# Patient Record
Sex: Male | Born: 1945 | Race: White | Hispanic: No | Marital: Married | Smoking: Former smoker
Health system: Southern US, Community
[De-identification: ages and names within clinical notes are randomized; demographics above are authoritative.]

## PROBLEM LIST (undated history)

## (undated) DIAGNOSIS — I442 Atrioventricular block, complete: Secondary | ICD-10-CM

## (undated) DIAGNOSIS — I5032 Chronic diastolic (congestive) heart failure: Secondary | ICD-10-CM

## (undated) DIAGNOSIS — I10 Essential (primary) hypertension: Secondary | ICD-10-CM

## (undated) DIAGNOSIS — E785 Hyperlipidemia, unspecified: Secondary | ICD-10-CM

## (undated) DIAGNOSIS — E039 Hypothyroidism, unspecified: Secondary | ICD-10-CM

## (undated) DIAGNOSIS — I447 Left bundle-branch block, unspecified: Secondary | ICD-10-CM

## (undated) DIAGNOSIS — N183 Chronic kidney disease, stage 3 (moderate): Secondary | ICD-10-CM

## (undated) DIAGNOSIS — E669 Obesity, unspecified: Secondary | ICD-10-CM

## (undated) DIAGNOSIS — I119 Hypertensive heart disease without heart failure: Secondary | ICD-10-CM

## (undated) DIAGNOSIS — I509 Heart failure, unspecified: Secondary | ICD-10-CM

## (undated) DIAGNOSIS — J449 Chronic obstructive pulmonary disease, unspecified: Secondary | ICD-10-CM

## (undated) DIAGNOSIS — E118 Type 2 diabetes mellitus with unspecified complications: Secondary | ICD-10-CM

## (undated) DIAGNOSIS — Z950 Presence of cardiac pacemaker: Secondary | ICD-10-CM

## (undated) HISTORY — PX: INSERT / REPLACE / REMOVE PACEMAKER: SUR710

## (undated) HISTORY — DX: Atrioventricular block, complete: I44.2

---

## 2010-01-11 ENCOUNTER — Ambulatory Visit: Payer: Self-pay | Admitting: Cardiology

## 2011-05-31 ENCOUNTER — Emergency Department (HOSPITAL_COMMUNITY): Payer: Medicare Other

## 2011-05-31 ENCOUNTER — Emergency Department (HOSPITAL_COMMUNITY)
Admission: EM | Admit: 2011-05-31 | Discharge: 2011-05-31 | Disposition: A | Discharge status: Home / Self Care | Payer: Medicare Other | Attending: Emergency Medicine | Admitting: Emergency Medicine

## 2011-05-31 ENCOUNTER — Encounter (HOSPITAL_COMMUNITY): Payer: Self-pay | Admitting: *Deleted

## 2011-05-31 DIAGNOSIS — R112 Nausea with vomiting, unspecified: Secondary | ICD-10-CM | POA: Insufficient documentation

## 2011-05-31 DIAGNOSIS — E119 Type 2 diabetes mellitus without complications: Secondary | ICD-10-CM | POA: Insufficient documentation

## 2011-05-31 DIAGNOSIS — I10 Essential (primary) hypertension: Secondary | ICD-10-CM | POA: Insufficient documentation

## 2011-05-31 DIAGNOSIS — M7989 Other specified soft tissue disorders: Secondary | ICD-10-CM | POA: Insufficient documentation

## 2011-05-31 DIAGNOSIS — E789 Disorder of lipoprotein metabolism, unspecified: Secondary | ICD-10-CM | POA: Insufficient documentation

## 2011-05-31 DIAGNOSIS — R42 Dizziness and giddiness: Secondary | ICD-10-CM | POA: Insufficient documentation

## 2011-05-31 DIAGNOSIS — I509 Heart failure, unspecified: Secondary | ICD-10-CM | POA: Insufficient documentation

## 2011-05-31 DIAGNOSIS — E079 Disorder of thyroid, unspecified: Secondary | ICD-10-CM | POA: Insufficient documentation

## 2011-05-31 DIAGNOSIS — Z79899 Other long term (current) drug therapy: Secondary | ICD-10-CM | POA: Insufficient documentation

## 2011-05-31 DIAGNOSIS — R0602 Shortness of breath: Secondary | ICD-10-CM | POA: Insufficient documentation

## 2011-05-31 DIAGNOSIS — R29810 Facial weakness: Secondary | ICD-10-CM | POA: Insufficient documentation

## 2011-05-31 DIAGNOSIS — R630 Anorexia: Secondary | ICD-10-CM | POA: Insufficient documentation

## 2011-05-31 HISTORY — DX: Heart failure, unspecified: I50.9

## 2011-05-31 HISTORY — DX: Essential (primary) hypertension: I10

## 2011-05-31 LAB — COMPREHENSIVE METABOLIC PANEL
AST: 14 U/L (ref 0–37)
Albumin: 3.5 g/dL (ref 3.5–5.2)
Chloride: 104 mEq/L (ref 96–112)
Creatinine, Ser: 1.59 mg/dL — ABNORMAL HIGH (ref 0.50–1.35)
Total Bilirubin: 0.2 mg/dL — ABNORMAL LOW (ref 0.3–1.2)

## 2011-05-31 LAB — GLUCOSE, CAPILLARY: Glucose-Capillary: 111 mg/dL — ABNORMAL HIGH (ref 70–99)

## 2011-05-31 LAB — CBC
MCV: 87.8 fL (ref 78.0–100.0)
Platelets: 212 10*3/uL (ref 150–400)
RDW: 14.2 % (ref 11.5–15.5)
WBC: 9 10*3/uL (ref 4.0–10.5)

## 2011-05-31 MED ORDER — MECLIZINE HCL 12.5 MG PO TABS
25.0000 mg | ORAL_TABLET | Freq: Once | ORAL | Status: AC
  Administered 2011-05-31: 25 mg via ORAL
  Filled 2011-05-31: qty 2

## 2011-05-31 MED ORDER — SODIUM CHLORIDE 0.9 % IV SOLN: INTRAVENOUS | Status: DC

## 2011-05-31 MED ORDER — ONDANSETRON HCL 4 MG/2ML IJ SOLN
4.0000 mg | Freq: Once | INTRAMUSCULAR | Status: AC
  Administered 2011-05-31: 4 mg via INTRAVENOUS
  Filled 2011-05-31: qty 2

## 2011-05-31 MED ORDER — MECLIZINE HCL 25 MG PO TABS: 25.0000 mg | ORAL_TABLET | Freq: Three times a day (TID) | ORAL | Status: AC | PRN

## 2011-05-31 MED ORDER — SODIUM CHLORIDE 0.9 % IV BOLUS (SEPSIS)
250.0000 mL | Freq: Once | INTRAVENOUS | Status: AC
  Administered 2011-05-31: 250 mL via INTRAVENOUS

## 2011-05-31 NOTE — ED Notes (Signed)
Pt c/o dizziness, nausea and vomiting since Tuesday. Pt has had right sided facial drooping since yesterday. Pt able to raise arms over his head and raise legs off floor. Pt states that the dizziness and balance have gotten worse since Tuesday.

## 2011-05-31 NOTE — ED Provider Notes (Signed)
History   This chart was scribed for Shelda Jakes, MD by Sofie Rower. The patient was seen in room APAH2/APAH2 and the patient's care was started at 3:14 PM     CSN: [id number]  Arrival date & time 05/31/11  1408   First MD Initiated Contact with Patient 05/31/11 1509      Chief Complaint  Patient presents with  . Dizziness    (Consider location/radiation/quality/duration/timing/severity/associated sxs/prior treatment) HPI  [name] is a 66 y.o. male who presents to the Emergency Department complaining of moderate, episodic dizziness onset five days ago with associated symptoms of nausea, vomiting (tuesday, 05/27/11 at 2:00PM), right sided facial drooping (onset Tuesday, 05/27/11 at 2:00PM), shortness of breath, swelling in both feet (onset Tuesday 05/27/11). The pt states "the room spins with the dizziness, it feels like he is going around in a circle." Pt relative informs EDP that facial droop started on Monday. Pt states he "is closing his right eye so he can balance." Pt wife states, "there has been no vomiting since Wednesday, pt fell in the bathroom yesterday." Pt wife informs EDP "the pt has had a decreased appetite for the past two days, and she was worried about possible vertigo within the pt." Modifying factors include shutting the right eye with moderate relief, standing up which intensifies the dizziness. Pt has a hx of diabetes, neuropathy, daughter with a hx of nora virus.  Pt denies diarrhea, vomiting yesterday, inability to move feet, similar symptoms in the past, headache, fever, chills, nasal congestion, sore throat, chest pain, cough, belly pain, dysuria, neck pain, back pain, hematuria, rash. Pt wife denies pt hx of vertigo.  Pt is married.   PCP is [place].     Past Medical History  Diagnosis Date  . Diabetes mellitus   . Neuropathy   . CHF (congestive heart failure)   . High cholesterol   . Hypertension   . Renal disorder   . Thyroid disease        History  Substance Use Topics  . Smoking status: Former Games developer  . Smokeless tobacco: Not on file  . Alcohol Use: No      Review of Systems  All other systems reviewed and are negative.    10 Systems reviewed and all are negative for acute change except as noted in the HPI.    Allergies  Lasix  Home Medications   Current Outpatient Rx  Name Route Sig Dispense Refill  . MECLIZINE HCL 25 MG PO TABS Oral Take 1 tablet (25 mg total) by mouth 3 (three) times daily as needed. 30 tablet 0    BP 136/78  Pulse 66  Temp(Src) 97.5 F (36.4 C) (Oral)  Resp 24  Ht 5\' 11"  (1.803 m)  Wt 250 lb (113.399 kg)  BMI 34.87 kg/m2  SpO2 97%  Physical Exam  Nursing note and vitals reviewed. Constitutional: He is oriented to person, place, and time. He appears well-developed and well-nourished.  HENT:  Nose: Nose normal.  Mouth/Throat: Oropharynx is clear and moist.  Eyes: Conjunctivae are normal.  Neck: Normal range of motion. Neck supple.  Cardiovascular: Normal rate, regular rhythm and normal heart sounds.  Exam reveals no gallop and no friction rub.   No murmur heard. Pulmonary/Chest: Effort normal and breath sounds normal. He has no wheezes. He has no rales.  Abdominal: Soft. Bowel sounds are normal. There is no tenderness. There is no rebound.  Musculoskeletal: Normal range of motion.  Neurological: He is alert  and oriented to person, place, and time. Coordination normal.  Skin: Skin is warm and dry.  Psychiatric: He has a normal mood and affect. His behavior is normal.    ED Course  Procedures (including critical care time)  DIAGNOSTIC STUDIES: Oxygen Saturation is 97% on room air, adequate by my interpretation.    COORDINATION OF CARE:  Results for orders placed during the hospital encounter of 05/31/11  GLUCOSE, CAPILLARY      Component Value Range   Glucose-Capillary 111 (*) 70 - 99 (mg/dL)  CBC      Component Value Range   WBC 9.0  4.0 - 10.5 (K/uL)    RBC 3.93 (*) 4.22 - 5.81 (MIL/uL)   Hemoglobin 11.8 (*) 13.0 - 17.0 (g/dL)   HCT 16.1 (*) 09.6 - 52.0 (%)   MCV 87.8  78.0 - 100.0 (fL)   MCH 30.0  26.0 - 34.0 (pg)   MCHC 34.2  30.0 - 36.0 (g/dL)   RDW 04.5  40.9 - 81.1 (%)   Platelets 212  150 - 400 (K/uL)  COMPREHENSIVE METABOLIC PANEL      Component Value Range   Sodium 139  135 - 145 (mEq/L)   Potassium 3.7  3.5 - 5.1 (mEq/L)   Chloride 104  96 - 112 (mEq/L)   CO2 26  19 - 32 (mEq/L)   Glucose, Bld 103 (*) 70 - 99 (mg/dL)   BUN 33 (*) 6 - 23 (mg/dL)   Creatinine, Ser 9.14 (*) 0.50 - 1.35 (mg/dL)   Calcium 78.2  8.4 - 10.5 (mg/dL)   Total Protein 7.3  6.0 - 8.3 (g/dL)   Albumin 3.5  3.5 - 5.2 (g/dL)   AST 14  0 - 37 (U/L)   ALT 13  0 - 53 (U/L)   Alkaline Phosphatase 100  39 - 117 (U/L)   Total Bilirubin 0.2 (*) 0.3 - 1.2 (mg/dL)   GFR calc non Af Amer 44 (*) >90 (mL/min)   GFR calc Af Amer 51 (*) >90 (mL/min)   Dg Chest 2 View  05/31/2011  *RADIOLOGY REPORT*  Clinical Data: Dizziness and shortness of breath.  CHEST - 2 VIEW  Comparison: None.  Findings: Cardiac enlargement is noted.  The lung volumes are low. No focal airspace disease is evident.  The visualized soft tissues and bony thorax are unremarkable.  IMPRESSION:  1.  Cardiomegaly without failure. 2.  Low lung volumes.  Original Report Authenticated By: Jamesetta Orleans. MATTERN, M.D.   Ct Head Wo Contrast  05/31/2011  *RADIOLOGY REPORT*  Clinical Data: Dizziness.  Hypertension.  CT HEAD WITHOUT CONTRAST  Technique:  Contiguous axial images were obtained from the base of the skull through the vertex without contrast.  Comparison: None.  Findings: Moderate generalized atrophy is advanced for age. 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  Ventricular enlargement is proportionate to the degree of atrophy.  Mild periventricular white matter hypoattenuation is present bilaterally.  No acute cortical infarct, hemorrhage, or mass lesion is present.  The paranasal sinuses  and mastoid air cells are clear.  The osseous skull is intact. Atherosclerotic calcifications are present within the cavernous carotid arteries bilaterally.  IMPRESSION:  1.  Moderate atrophy and white matter disease. 2.  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 3.  No acute intracranial abnormality. 4.  Atherosclerosis.  Original Report Authenticated By: Jamesetta Orleans. MATTERN, M.D.     Date: 05/31/2011  Rate: 79  Rhythm: normal sinus rhythm  QRS Axis: normal  Intervals: normal  ST/T Wave abnormalities: nonspecific ST/T changes  Conduction Disutrbances:left bundle branch block  Narrative Interpretation:   Old EKG Reviewed: none available        1. Vertigo     3:25PM- EDP at bedside discusses treatment plan.   3:25PM- Pt relative informs EDP that she would like EDP to inquire with pt's wife about symptomatic timeline.   3:56PM-Recheck. EDP inquires additional pt hx from pt wife.   MDM  Suspect symptoms are vertigo most likely etiology is viral do not think it's a central patient's head CT negative symptoms been ongoing now for 5 days. Started with nausea and vomiting that is since eased off another reason why it sounds like it's viral. Patient improved the emergency department with Antivert. Patient followed by [facility] Patient known to have renal insufficiency. EKG with left bundle branch block noted family states that his had abnormal EKGs in the past no R1 for comparison. No focal findings concerns initially by nursing about right-sided facial droop as per family is actually baseline and there is no change in that at all. Family was mostly just concerned about the persistent dizziness which made it difficult for him to ambulate and they were worried he was going to be falling at home.      I personally performed the services described in this documentation, which was scribed in my presence. The recorded information has been reviewed and  considered.     Shelda Jakes, MD 05/31/11 [id number]

## 2011-05-31 NOTE — Discharge Instructions (Signed)
Benign Positional Vertigo Vertigo means you feel like you or your surroundings are moving when they are not. Benign positional vertigo is the most common form of vertigo. Benign means that the cause of your condition is not serious. Benign positional vertigo is more common in older adults. CAUSES  Benign positional vertigo is the result of an upset in the labyrinth system. This is an area in the middle ear that helps control your balance. This may be caused by a viral infection, head injury, or repetitive motion. However, often no specific cause is found. SYMPTOMS  Symptoms of benign positional vertigo occur when you move your head or eyes in different directions. Some of the symptoms may include:  Loss of balance and falls.   Vomiting.   Blurred vision.   Dizziness.   Nausea.   Involuntary eye movements (nystagmus).  DIAGNOSIS  Benign positional vertigo is usually diagnosed by physical exam. If the specific cause of your benign positional vertigo is unknown, your caregiver may perform imaging tests, such as magnetic resonance imaging (MRI) or computed tomography (CT). TREATMENT  Your caregiver may recommend movements or procedures to correct the benign positional vertigo. Medicines such as meclizine, benzodiazepines, and medicines for nausea may be used to treat your symptoms. In rare cases, if your symptoms are caused by certain conditions that affect the inner ear, you may need surgery. HOME CARE INSTRUCTIONS   Follow your caregiver's instructions.   Move slowly. Do not make sudden body or head movements.   Avoid driving.   Avoid operating heavy machinery.   Avoid performing any tasks that would be dangerous to you or others during a vertigo episode.   Drink enough fluids to keep your urine clear or pale yellow.  SEEK IMMEDIATE MEDICAL CARE IF:   You develop problems with walking, weakness, numbness, or using your arms, hands, or legs.   You have difficulty speaking.   You  develop severe headaches.   Your nausea or vomiting continues or gets worse.   You develop visual changes.   Your family or friends notice any behavioral changes.   Your condition gets worse.   You have a fever.   You develop a stiff neck or sensitivity to light.  MAKE SURE YOU:   Understand these instructions.   Will watch your condition.   Will get help right away if you are not doing well or get worse.  Document Released: 11/11/2005 Document Revised: 01/23/2011 Document Reviewed: 10/24/2010 [facility] Patient Information 2012 [place], [place].   Take [facility]vert as [facility]. Return for any new or worse symptoms.

## 2012-08-01 ENCOUNTER — Emergency Department (HOSPITAL_COMMUNITY): Payer: Medicare Other

## 2012-08-01 ENCOUNTER — Inpatient Hospital Stay (HOSPITAL_COMMUNITY)
Admission: AD | Admit: 2012-08-01 | Discharge: 2012-08-05 | DRG: 308 | Disposition: A | Discharge status: Home / Self Care | Payer: Medicare Other | Attending: Cardiology | Admitting: Cardiology

## 2012-08-01 ENCOUNTER — Encounter (HOSPITAL_COMMUNITY): Payer: Self-pay | Admitting: Emergency Medicine

## 2012-08-01 DIAGNOSIS — N179 Acute kidney failure, unspecified: Secondary | ICD-10-CM | POA: Diagnosis present

## 2012-08-01 DIAGNOSIS — I119 Hypertensive heart disease without heart failure: Secondary | ICD-10-CM | POA: Diagnosis present

## 2012-08-01 DIAGNOSIS — Z794 Long term (current) use of insulin: Secondary | ICD-10-CM

## 2012-08-01 DIAGNOSIS — I442 Atrioventricular block, complete: Principal | ICD-10-CM | POA: Diagnosis present

## 2012-08-01 DIAGNOSIS — R001 Bradycardia, unspecified: Secondary | ICD-10-CM | POA: Diagnosis present

## 2012-08-01 DIAGNOSIS — Z7982 Long term (current) use of aspirin: Secondary | ICD-10-CM

## 2012-08-01 DIAGNOSIS — I469 Cardiac arrest, cause unspecified: Secondary | ICD-10-CM | POA: Diagnosis present

## 2012-08-01 DIAGNOSIS — N184 Chronic kidney disease, stage 4 (severe): Secondary | ICD-10-CM | POA: Diagnosis present

## 2012-08-01 DIAGNOSIS — I130 Hypertensive heart and chronic kidney disease with heart failure and stage 1 through stage 4 chronic kidney disease, or unspecified chronic kidney disease: Secondary | ICD-10-CM | POA: Diagnosis present

## 2012-08-01 DIAGNOSIS — Z950 Presence of cardiac pacemaker: Secondary | ICD-10-CM | POA: Diagnosis not present

## 2012-08-01 DIAGNOSIS — N183 Chronic kidney disease, stage 3 unspecified: Secondary | ICD-10-CM | POA: Diagnosis present

## 2012-08-01 DIAGNOSIS — Z87891 Personal history of nicotine dependence: Secondary | ICD-10-CM

## 2012-08-01 DIAGNOSIS — I4891 Unspecified atrial fibrillation: Secondary | ICD-10-CM | POA: Diagnosis present

## 2012-08-01 DIAGNOSIS — IMO0001 Reserved for inherently not codable concepts without codable children: Secondary | ICD-10-CM | POA: Diagnosis present

## 2012-08-01 DIAGNOSIS — E1149 Type 2 diabetes mellitus with other diabetic neurological complication: Secondary | ICD-10-CM | POA: Diagnosis present

## 2012-08-01 DIAGNOSIS — I252 Old myocardial infarction: Secondary | ICD-10-CM

## 2012-08-01 DIAGNOSIS — E1142 Type 2 diabetes mellitus with diabetic polyneuropathy: Secondary | ICD-10-CM | POA: Diagnosis present

## 2012-08-01 DIAGNOSIS — IMO0002 Reserved for concepts with insufficient information to code with codable children: Secondary | ICD-10-CM

## 2012-08-01 DIAGNOSIS — I447 Left bundle-branch block, unspecified: Secondary | ICD-10-CM | POA: Diagnosis present

## 2012-08-01 DIAGNOSIS — E039 Hypothyroidism, unspecified: Secondary | ICD-10-CM | POA: Diagnosis present

## 2012-08-01 DIAGNOSIS — Z8249 Family history of ischemic heart disease and other diseases of the circulatory system: Secondary | ICD-10-CM

## 2012-08-01 DIAGNOSIS — I5033 Acute on chronic diastolic (congestive) heart failure: Secondary | ICD-10-CM | POA: Diagnosis present

## 2012-08-01 DIAGNOSIS — R7989 Other specified abnormal findings of blood chemistry: Secondary | ICD-10-CM | POA: Diagnosis present

## 2012-08-01 DIAGNOSIS — D72829 Elevated white blood cell count, unspecified: Secondary | ICD-10-CM | POA: Diagnosis present

## 2012-08-01 DIAGNOSIS — I509 Heart failure, unspecified: Secondary | ICD-10-CM | POA: Diagnosis present

## 2012-08-01 DIAGNOSIS — E7800 Pure hypercholesterolemia, unspecified: Secondary | ICD-10-CM | POA: Diagnosis present

## 2012-08-01 HISTORY — DX: Hyperlipidemia, unspecified: E78.5

## 2012-08-01 HISTORY — DX: Left bundle-branch block, unspecified: I44.7

## 2012-08-01 HISTORY — DX: Chronic diastolic (congestive) heart failure: I50.32

## 2012-08-01 HISTORY — DX: Type 2 diabetes mellitus with unspecified complications: E11.8

## 2012-08-01 HISTORY — DX: Obesity, unspecified: E66.9

## 2012-08-01 HISTORY — DX: Hypothyroidism, unspecified: E03.9

## 2012-08-01 HISTORY — DX: Chronic kidney disease, stage 3 (moderate): N18.3

## 2012-08-01 HISTORY — DX: Hypertensive heart disease without heart failure: I11.9

## 2012-08-01 HISTORY — DX: Presence of cardiac pacemaker: Z95.0

## 2012-08-01 LAB — COMPREHENSIVE METABOLIC PANEL
BUN: 38 mg/dL — ABNORMAL HIGH (ref 6–23)
CO2: 23 mEq/L (ref 19–32)
Calcium: 9.8 mg/dL (ref 8.4–10.5)
Creatinine, Ser: 2.03 mg/dL — ABNORMAL HIGH (ref 0.50–1.35)
GFR calc Af Amer: 38 mL/min — ABNORMAL LOW (ref >90)
GFR calc non Af Amer: 32 mL/min — ABNORMAL LOW (ref >90)
Glucose, Bld: 208 mg/dL — ABNORMAL HIGH (ref 70–99)

## 2012-08-01 LAB — BLOOD GAS, ARTERIAL
Bicarbonate: 20.7 mEq/L (ref 20.0–24.0)
Expiratory PAP: 4
Inspiratory PAP: 12
Patient temperature: 37
pH, Arterial: 7.284 — ABNORMAL LOW (ref 7.350–7.450)
pO2, Arterial: 287 mmHg — ABNORMAL HIGH (ref 80.0–100.0)

## 2012-08-01 LAB — CBC WITH DIFFERENTIAL/PLATELET
Eosinophils Relative: 4 % (ref 0–5)
HCT: 37.3 % — ABNORMAL LOW (ref 39.0–52.0)
Lymphocytes Relative: 39 % (ref 12–46)
Lymphs Abs: 5.4 10*3/uL — ABNORMAL HIGH (ref 0.7–4.0)
MCV: 89 fL (ref 78.0–100.0)
Monocytes Absolute: 0.7 10*3/uL (ref 0.1–1.0)
Monocytes Relative: 5 % (ref 3–12)
RBC: 4.19 MIL/uL — ABNORMAL LOW (ref 4.22–5.81)
RDW: 14.1 % (ref 11.5–15.5)
WBC: 13.9 10*3/uL — ABNORMAL HIGH (ref 4.0–10.5)

## 2012-08-01 LAB — TROPONIN I: Troponin I: <0.3 ng/mL (ref <0.30)

## 2012-08-01 LAB — PROTIME-INR: Prothrombin Time: 13.9 seconds (ref 11.6–15.2)

## 2012-08-01 MED ORDER — ONDANSETRON HCL 4 MG/2ML IJ SOLN
INTRAMUSCULAR | Status: AC
  Administered 2012-08-01: 4 mg
  Filled 2012-08-01: qty 2

## 2012-08-01 MED ORDER — FENTANYL CITRATE 0.05 MG/ML IJ SOLN
100.0000 ug | Freq: Once | INTRAMUSCULAR | Status: AC
  Administered 2012-08-01: 100 ug via INTRAVENOUS
  Filled 2012-08-01: qty 2

## 2012-08-01 MED ORDER — ATROPINE SULFATE 1 MG/ML IJ SOLN
0.5000 mg | Freq: Once | INTRAMUSCULAR | Status: AC
  Administered 2012-08-01: 0.5 mg via INTRAVENOUS

## 2012-08-01 MED ORDER — DOPAMINE-DEXTROSE 3.2-5 MG/ML-% IV SOLN
INTRAVENOUS | Status: AC
  Administered 2012-08-01: 10 ug/kg/min
  Filled 2012-08-01: qty 250

## 2012-08-01 NOTE — ED Provider Notes (Signed)
History     This chart was scribed for Hurman Horn, MD by Jiles Prows, ED Scribe. The patient was seen in room APA01/APA01 and the patient's care was started at 8:37 PM.  CSN: [id number]  Arrival date & time 08/01/12  2044   Chief Complaint  Patient presents with  . Respiratory Distress   The history is provided by the patient and medical records. No language interpreter was used.   HPI Comments: [name] is a 67 y.o. male with a h/o DM and CHF who presents to the Emergency Department BIB EMS on BiPAP complaining of sudden, severe SOB, light-headedness, sweats, and generalized weakness that began a couple of hours ago.  EMS reports he was profusely, cyanotic, agonal respirations, diaphoretic, awake and talking but no palpable pulses when they arrived.  EMS reports that 30 min prior to arriving, his wife reports a blood sugar level of 400.  By the time EMS arrived, they read his sugar level at 207 after she had given him Novalog.  Pt reports nausea and vomiting once upon arrival to ED and airway suctioned Pt with good gag reflex.  Pt denies numbness, chest pain, headache, fever, chills, diarrhea, cough, and any other pain.  Heart rate was 20 bpm upon arrival to the ED.  EMS reports they gave 2 doses of atropine via an IO line for bradycardia rate 20 PTA.  [facility]  Past Medical History  Diagnosis Date  . Diabetes mellitus   . Neuropathy   . CHF (congestive heart failure)   . High cholesterol   . Hypertension   . Renal disorder   . Thyroid disease     History reviewed. No pertinent past surgical history.  History reviewed. No pertinent family history.  History  Substance Use Topics  . Smoking status: Former Games developer  . Smokeless tobacco: Not on file  . Alcohol Use: No      Review of Systems 10 Systems reviewed and all are negative for acute change except as noted in the HPI.   Allergies  Lasix  Home Medications   No current  outpatient prescriptions on file.  BP 118/91  Pulse 20  Resp 29  Ht 5\' 10"  (1.778 m)  Wt 250 lb (113.399 kg)  BMI 35.87 kg/m2  SpO2 100%  Physical Exam  Nursing note and vitals reviewed. Constitutional: He is oriented to person, place, and time. He appears well-developed and well-nourished.  Awake, alert, nontoxic appearance.  HENT:  Head: Atraumatic.  Eyes: Right eye exhibits no discharge. Left eye exhibits no discharge.  Neck: Neck supple.  Cardiovascular:  No murmur heard. Heart rate irregular and bradycardic. No bp obtainable upon arrival.  Pulse is palpable carotid only.  Pulmonary/Chest: He is in respiratory distress. He exhibits no tenderness.  Bilateral crackles decreased breath sounds bilaterally.  Severe respiratory distress.  On BiPAP.  No pulse ox obtainable upon arrival.    Abdominal: Soft. There is no tenderness. There is no rebound.  Musculoskeletal: He exhibits no edema and no tenderness.  Baseline ROM, no obvious new focal weakness.  Neurological: He is alert and oriented to person, place, and time.  Mental status and motor strength appears baseline for patient and situation.  Moves all 4 extremities to command.  3/5 strength bilaterally.  Skin: No rash noted. There is pallor.  Psychiatric: He has a normal mood and affect.    ED Course  Procedures (including critical care time) ECG: Complete heart block,  a ventricular rhythm, ventricular rate 30, left axis deviation, right bundle branch block, compared with April 2013 left bundle branch block no longer present, complete heart block and bradycardia now present  DIAGNOSTIC STUDIES: Oxygen Saturation is 100% on BiPap, adequate by my interpretation with 100% O2 via Bipap.    COORDINATION OF CARE: 8:59 PM - Blood sugar 193. Pale, ashen, and cyanotic upon arrival with carotid pulse palpable only with no pulse ox or BP obtainable upon arrival, but still able to talk and follow simple commands despite severe distress  and presyncope.  Pt started on dopamine, transcutaneous pacemaker, repeat atropine with partial capture of pacer.  Radial pulse now present.  SBP>100.  Pulse ox 100%.  Patient / Family / Caregiver understand and agree with initial ED impression and plan with expectations set for ED visit.  10:32 PM - Pt is off of BiPap.  He is awake and alert.  Pulse oximetry in 90's.  D/w Cards at [facility] accepted for transfer.  Patient tolerated transcutaneous pacing well in the emergency department, transitioned from BiPAP to oxygen mask to nasal cannula oxygen maintaining pulse oximetry in the 90s the emergency department.  Pt feels improved after observation and/or treatment in ED.Patient / Family / Caregiver informed of clinical course, understand medical decision-making process, and agree with plan.  Just prior to transfer the patient was switched from ED to [facility] monitor so transcutaneous pacing stopped revealing patient converted to sinus rhythm with left bundle-branch block with first degree AV block normal rate.  Pacer pads in place and dopamine available to [facility] for prn use during transfer.  CRITICAL CARE Performed by: Hurman Horn Total critical care time: including initial stabilization for severe symptomatic bradycardia with complete heart block with atropine, dopamine, and transcutaneous pacing. Critical care time was exclusive of separately billable procedures and treating other patients. Critical care was necessary to treat or prevent imminent or life-threatening deterioration. Critical care was time spent personally by me on the following activities: development of treatment plan with patient and/or surrogate as well as nursing, discussions with consultants, evaluation of patient's response to treatment, examination of patient, obtaining history from patient or surrogate, ordering and performing treatments and interventions, ordering and review of laboratory studies, ordering and review  of radiographic studies, pulse oximetry and re-evaluation of patient's condition.    Labs Reviewed  GLUCOSE, CAPILLARY - Abnormal; Notable for the following:    Glucose-Capillary 193 (*)    All other components within normal limits  CBC WITH DIFFERENTIAL - Abnormal; Notable for the following:    WBC 13.9 (*)    RBC 4.19 (*)    Hemoglobin 12.4 (*)    HCT 37.3 (*)    Lymphs Abs 5.4 (*)    All other components within normal limits  COMPREHENSIVE METABOLIC PANEL - Abnormal; Notable for the following:    Glucose, Bld 208 (*)    BUN 38 (*)    Creatinine, Ser 2.03 (*)    Total Bilirubin 0.2 (*)    GFR calc non Af Amer 32 (*)    GFR calc Af Amer 38 (*)    All other components within normal limits  PRO B NATRIURETIC PEPTIDE - Abnormal; Notable for the following:    Pro B Natriuretic peptide (BNP) 626.6 (*)    All other components within normal limits  BLOOD GAS, ARTERIAL - Abnormal; Notable for the following:    pH, Arterial 7.284 (*)    pCO2 arterial 45.1 (*)    pO2, Arterial  287.0 (*)    Acid-base deficit 4.8 (*)    All other components within normal limits  MRSA PCR SCREENING  TROPONIN I  PROTIME-INR  GLUCOSE, CAPILLARY  TROPONIN I  TROPONIN I  PROTIME-INR  APTT  CBC WITH DIFFERENTIAL  TSH  COMPREHENSIVE METABOLIC PANEL  MAGNESIUM  PRO B NATRIURETIC PEPTIDE  HEMOGLOBIN A1C  BASIC METABOLIC PANEL  CBC  LIPID PANEL   Dg Chest Portable 1 View  08/01/2012   *RADIOLOGY REPORT*  Clinical Data: Respiratory distress.  PORTABLE CHEST - 1 VIEW  Comparison: Chest x-ray 05/31/2011.  Findings: Lung volumes are very low.  Film is under penetrated, which limits the diagnostics sensitivity and specificity of this examination.  With this limitation in mind, the opacities in the lung bases bilaterally (left greater than right), are favored to promptly reflect subsegmental atelectasis.  No definite consolidative airspace disease.  Potential small left pleural effusion (although this may be  artifactual).  Pulmonary venous congestion, without frank pulmonary edema (likely accentuated by low lung volumes).  Mild cardiomegaly is unchanged.  Upper mediastinal contours are distorted by low lung volumes and lordotic positioning.  Atherosclerosis of the thoracic aorta. Transcutaneous defibrillator pad noted over the lower thorax.  IMPRESSION: 1.  Low lung volumes with probable bibasilar subsegmental atelectasis and potential small left pleural effusion. 2.  Cardiomegaly. 3.  Atherosclerosis.   Original Report Authenticated By: Trudie Reed, M.D.     1. Bradycardia   2. Complete heart block       MDM  The patient appears reasonably stabilized for transfer considering the current resources, flow, and capabilities available in the ED at this time, and I doubt any other [facility] requiring further screening and/or treatment in the ED prior to admission.      I personally performed the services described in this documentation, which was scribed in my presence. The recorded information has been reviewed and is accurate.    Hurman Horn, MD 08/02/12 [phone]

## 2012-08-01 NOTE — Progress Notes (Signed)
Pt has been Taken off BIPAP on Servo-I and placed on NRB mask .

## 2012-08-01 NOTE — ED Notes (Signed)
ems called out for this pt for "diabetic" arrived to find pt in resp distress with heart rate of 26.  Pt arrives to e.d. On bipap per ems

## 2012-08-01 NOTE — ED Notes (Signed)
Patient removed from bi-pap and placed on non-rebreather at 15L O2. Patient alert and oriented. States "I feel much better." Family at bedside.

## 2012-08-01 NOTE — ED Notes (Signed)
Patient complaining of pain from pacer. Advised MD. Verbal order for fentanyl IV.

## 2012-08-01 NOTE — ED Notes (Signed)
Patient placed on temporary pacer. Capture at 74mA. 80 bpm.

## 2012-08-02 ENCOUNTER — Encounter (HOSPITAL_COMMUNITY): Payer: Self-pay | Admitting: Cardiology

## 2012-08-02 DIAGNOSIS — R7989 Other specified abnormal findings of blood chemistry: Secondary | ICD-10-CM

## 2012-08-02 DIAGNOSIS — I442 Atrioventricular block, complete: Secondary | ICD-10-CM

## 2012-08-02 DIAGNOSIS — E118 Type 2 diabetes mellitus with unspecified complications: Secondary | ICD-10-CM

## 2012-08-02 DIAGNOSIS — R001 Bradycardia, unspecified: Secondary | ICD-10-CM | POA: Diagnosis present

## 2012-08-02 DIAGNOSIS — D72829 Elevated white blood cell count, unspecified: Secondary | ICD-10-CM | POA: Diagnosis present

## 2012-08-02 DIAGNOSIS — I447 Left bundle-branch block, unspecified: Secondary | ICD-10-CM

## 2012-08-02 DIAGNOSIS — N183 Chronic kidney disease, stage 3 unspecified: Secondary | ICD-10-CM

## 2012-08-02 HISTORY — DX: Left bundle-branch block, unspecified: I44.7

## 2012-08-02 HISTORY — DX: Chronic kidney disease, stage 3 unspecified: N18.30

## 2012-08-02 HISTORY — DX: Type 2 diabetes mellitus with unspecified complications: E11.8

## 2012-08-02 LAB — COMPREHENSIVE METABOLIC PANEL
BUN: 39 mg/dL — ABNORMAL HIGH (ref 6–23)
Calcium: 9.6 mg/dL (ref 8.4–10.5)
GFR calc Af Amer: 33 mL/min — ABNORMAL LOW (ref >90)
Glucose, Bld: 94 mg/dL (ref 70–99)
Total Protein: 7.6 g/dL (ref 6.0–8.3)

## 2012-08-02 LAB — CBC WITH DIFFERENTIAL/PLATELET
Basophils Relative: 0 % (ref 0–1)
Eosinophils Absolute: 0 10*3/uL (ref 0.0–0.7)
Eosinophils Absolute: 0 10*3/uL (ref 0.0–0.7)
Eosinophils Relative: 0 % (ref 0–5)
HCT: 37.4 % — ABNORMAL LOW (ref 39.0–52.0)
Hemoglobin: 13.1 g/dL (ref 13.0–17.0)
Lymphs Abs: 2 10*3/uL (ref 0.7–4.0)
MCH: 30.3 pg (ref 26.0–34.0)
MCH: 29.7 pg (ref 26.0–34.0)
MCHC: 35 g/dL (ref 30.0–36.0)
MCV: 87.1 fL (ref 78.0–100.0)
Monocytes Absolute: 1.5 10*3/uL — ABNORMAL HIGH (ref 0.1–1.0)
Neutro Abs: 13 10*3/uL — ABNORMAL HIGH (ref 1.7–7.7)
Neutrophils Relative %: 79 % — ABNORMAL HIGH (ref 43–77)
Platelets: 190 10*3/uL (ref 150–400)
RDW: 14.1 % (ref 11.5–15.5)
RDW: 14.1 % (ref 11.5–15.5)

## 2012-08-02 LAB — LIPID PANEL
Cholesterol: 135 mg/dL (ref 0–200)
VLDL: 28 mg/dL (ref 0–40)

## 2012-08-02 LAB — GLUCOSE, CAPILLARY: Glucose-Capillary: 198 mg/dL — ABNORMAL HIGH (ref 70–99)

## 2012-08-02 LAB — HEMOGLOBIN A1C
Hgb A1c MFr Bld: 7.1 % — ABNORMAL HIGH (ref <5.7)
Mean Plasma Glucose: 157 mg/dL — ABNORMAL HIGH (ref <117)

## 2012-08-02 LAB — TROPONIN I
Troponin I: <0.3 ng/mL (ref <0.30)
Troponin I: <0.3 ng/mL (ref <0.30)

## 2012-08-02 LAB — MAGNESIUM: Magnesium: 1.6 mg/dL (ref 1.5–2.5)

## 2012-08-02 LAB — PROTIME-INR: INR: 1.01 (ref 0.00–1.49)

## 2012-08-02 LAB — PRO B NATRIURETIC PEPTIDE: Pro B Natriuretic peptide (BNP): 1103 pg/mL — ABNORMAL HIGH (ref 0–125)

## 2012-08-02 MED ORDER — PENTOXIFYLLINE ER 400 MG PO TBCR
400.0000 mg | EXTENDED_RELEASE_TABLET | Freq: Three times a day (TID) | ORAL | Status: DC
  Administered 2012-08-02 – 2012-08-05 (×9): 400 mg via ORAL
  Filled 2012-08-02 (×13): qty 1

## 2012-08-02 MED ORDER — CEFAZOLIN SODIUM-DEXTROSE 2-3 GM-% IV SOLR
2.0000 g | INTRAVENOUS | Status: DC
  Filled 2012-08-02: qty 50

## 2012-08-02 MED ORDER — LEVOTHYROXINE SODIUM 25 MCG PO TABS
25.0000 ug | ORAL_TABLET | Freq: Every day | ORAL | Status: DC
  Administered 2012-08-02 – 2012-08-05 (×4): 25 ug via ORAL
  Filled 2012-08-02 (×5): qty 1

## 2012-08-02 MED ORDER — MUPIROCIN 2 % EX OINT
1.0000 "application " | TOPICAL_OINTMENT | Freq: Two times a day (BID) | CUTANEOUS | Status: DC
  Administered 2012-08-02 – 2012-08-05 (×7): 1 via NASAL
  Filled 2012-08-02 (×2): qty 22

## 2012-08-02 MED ORDER — ASPIRIN EC 81 MG PO TBEC
81.0000 mg | DELAYED_RELEASE_TABLET | Freq: Every day | ORAL | Status: DC
  Administered 2012-08-02 – 2012-08-05 (×4): 81 mg via ORAL
  Filled 2012-08-02 (×4): qty 1

## 2012-08-02 MED ORDER — HEPARIN SODIUM (PORCINE) 5000 UNIT/ML IJ SOLN
5000.0000 [IU] | Freq: Three times a day (TID) | INTRAMUSCULAR | Status: DC
  Administered 2012-08-02 – 2012-08-05 (×8): 5000 [IU] via SUBCUTANEOUS
  Filled 2012-08-02 (×13): qty 1

## 2012-08-02 MED ORDER — DOPAMINE-DEXTROSE 3.2-5 MG/ML-% IV SOLN
5.0000 ug/kg/min | INTRAVENOUS | Status: DC
  Administered 2012-08-02: 2 ug/kg/min via INTRAVENOUS
  Administered 2012-08-02: 3 ug/kg/min via INTRAVENOUS
  Filled 2012-08-02: qty 250

## 2012-08-02 MED ORDER — SODIUM CHLORIDE 0.9 % IR SOLN
80.0000 mg | Status: DC
  Filled 2012-08-02: qty 2

## 2012-08-02 MED ORDER — INSULIN GLARGINE 100 UNIT/ML ~~LOC~~ SOLN
30.0000 [IU] | Freq: Every day | SUBCUTANEOUS | Status: DC
  Administered 2012-08-03 – 2012-08-04 (×2): 30 [IU] via SUBCUTANEOUS
  Filled 2012-08-02 (×4): qty 0.3

## 2012-08-02 MED ORDER — ATORVASTATIN CALCIUM 40 MG PO TABS
40.0000 mg | ORAL_TABLET | Freq: Every day | ORAL | Status: DC
  Administered 2012-08-02 – 2012-08-04 (×3): 40 mg via ORAL
  Filled 2012-08-02 (×5): qty 1

## 2012-08-02 MED ORDER — IPRATROPIUM-ALBUTEROL 18-103 MCG/ACT IN AERO
1.0000 | INHALATION_SPRAY | Freq: Four times a day (QID) | RESPIRATORY_TRACT | Status: DC
  Filled 2012-08-02: qty 14.7

## 2012-08-02 MED ORDER — INSULIN ASPART 100 UNIT/ML ~~LOC~~ SOLN
15.0000 [IU] | Freq: Three times a day (TID) | SUBCUTANEOUS | Status: DC
  Administered 2012-08-02 – 2012-08-03 (×3): 15 [IU] via SUBCUTANEOUS

## 2012-08-02 MED ORDER — ASPIRIN EC 81 MG PO TBEC: 81.0000 mg | DELAYED_RELEASE_TABLET | Freq: Every day | ORAL | Status: DC

## 2012-08-02 MED ORDER — NITROGLYCERIN 0.4 MG SL SUBL: 0.4000 mg | SUBLINGUAL_TABLET | SUBLINGUAL | Status: DC | PRN

## 2012-08-02 MED ORDER — VITAMIN D3 25 MCG (1000 UNIT) PO TABS
2000.0000 [IU] | ORAL_TABLET | Freq: Every day | ORAL | Status: DC
  Administered 2012-08-02 – 2012-08-05 (×4): 2000 [IU] via ORAL
  Filled 2012-08-02 (×4): qty 2

## 2012-08-02 MED ORDER — OMEGA-3 FATTY ACIDS 1000 MG PO CAPS: 1.0000 g | ORAL_CAPSULE | Freq: Two times a day (BID) | ORAL | Status: DC

## 2012-08-02 MED ORDER — VITAMIN D 50 MCG (2000 UT) PO CAPS: 2000.0000 [IU] | ORAL_CAPSULE | Freq: Every day | ORAL | Status: DC

## 2012-08-02 MED ORDER — CHLORHEXIDINE GLUCONATE 4 % EX LIQD
60.0000 mL | Freq: Once | CUTANEOUS | Status: AC
  Administered 2012-08-03: 4 via TOPICAL
  Filled 2012-08-02: qty 60

## 2012-08-02 MED ORDER — SODIUM CHLORIDE 0.9 % IV SOLN
INTRAVENOUS | Status: DC
  Administered 2012-08-03: 11:00:00 via INTRAVENOUS

## 2012-08-02 MED ORDER — OLOPATADINE HCL 0.1 % OP SOLN
1.0000 [drp] | Freq: Two times a day (BID) | OPHTHALMIC | Status: DC | PRN
  Administered 2012-08-02 – 2012-08-04 (×2): 1 [drp] via OPHTHALMIC
  Filled 2012-08-02: qty 5

## 2012-08-02 MED ORDER — SODIUM CHLORIDE 0.9 % IJ SOLN: 3.0000 mL | INTRAMUSCULAR | Status: DC | PRN

## 2012-08-02 MED ORDER — SODIUM CHLORIDE 0.9 % IV SOLN: 250.0000 mL | INTRAVENOUS | Status: DC | PRN

## 2012-08-02 MED ORDER — INSULIN REGULAR BOLUS VIA INFUSION: 0.0000 [IU] | Freq: Three times a day (TID) | INTRAVENOUS | Status: DC

## 2012-08-02 MED ORDER — SODIUM CHLORIDE 0.45 % IV SOLN: INTRAVENOUS | Status: DC

## 2012-08-02 MED ORDER — CHLORHEXIDINE GLUCONATE CLOTH 2 % EX PADS
6.0000 | MEDICATED_PAD | Freq: Every day | CUTANEOUS | Status: DC
  Administered 2012-08-03 – 2012-08-05 (×3): 6 via TOPICAL

## 2012-08-02 MED ORDER — SODIUM CHLORIDE 0.9 % IJ SOLN
3.0000 mL | Freq: Two times a day (BID) | INTRAMUSCULAR | Status: DC
  Administered 2012-08-02 – 2012-08-05 (×8): 3 mL via INTRAVENOUS

## 2012-08-02 MED ORDER — OMEGA-3-ACID ETHYL ESTERS 1 G PO CAPS
1.0000 g | ORAL_CAPSULE | Freq: Two times a day (BID) | ORAL | Status: DC
  Administered 2012-08-02 – 2012-08-05 (×7): 1 g via ORAL
  Filled 2012-08-02 (×8): qty 1

## 2012-08-02 MED ORDER — KETOTIFEN FUMARATE 0.025 % OP SOLN: 1.0000 [drp] | Freq: Two times a day (BID) | OPHTHALMIC | Status: DC | PRN

## 2012-08-02 MED ORDER — IPRATROPIUM-ALBUTEROL 20-100 MCG/ACT IN AERS
1.0000 | INHALATION_SPRAY | Freq: Four times a day (QID) | RESPIRATORY_TRACT | Status: DC
  Administered 2012-08-02 – 2012-08-05 (×11): 1 via RESPIRATORY_TRACT
  Filled 2012-08-02: qty 4

## 2012-08-02 MED ORDER — LUBRIFRESH P.M. OP OINT: 1.0000 "application " | TOPICAL_OINTMENT | Freq: Every evening | OPHTHALMIC | Status: DC

## 2012-08-02 MED ORDER — ONDANSETRON HCL 4 MG/2ML IJ SOLN
4.0000 mg | Freq: Four times a day (QID) | INTRAMUSCULAR | Status: DC | PRN
Start: 2012-08-02 — End: 2012-08-05
  Administered 2012-08-02 (×2): 4 mg via INTRAVENOUS
  Filled 2012-08-02 (×3): qty 2

## 2012-08-02 MED ORDER — PANTOPRAZOLE SODIUM 40 MG PO TBEC
40.0000 mg | DELAYED_RELEASE_TABLET | Freq: Every day | ORAL | Status: DC
  Administered 2012-08-02 – 2012-08-05 (×4): 40 mg via ORAL
  Filled 2012-08-02 (×4): qty 1

## 2012-08-02 MED ORDER — FERROUS SULFATE 325 (65 FE) MG PO TABS
325.0000 mg | ORAL_TABLET | Freq: Two times a day (BID) | ORAL | Status: DC
  Administered 2012-08-02 – 2012-08-05 (×7): 325 mg via ORAL
  Filled 2012-08-02 (×10): qty 1

## 2012-08-02 MED ORDER — VITAMIN B-1 100 MG PO TABS
100.0000 mg | ORAL_TABLET | Freq: Every day | ORAL | Status: DC
  Administered 2012-08-02 – 2012-08-05 (×4): 100 mg via ORAL
  Filled 2012-08-02 (×4): qty 1

## 2012-08-02 MED ORDER — CHLORHEXIDINE GLUCONATE 4 % EX LIQD
60.0000 mL | Freq: Once | CUTANEOUS | Status: AC
  Administered 2012-08-02: 4 via TOPICAL
  Filled 2012-08-02: qty 60

## 2012-08-02 MED ORDER — SODIUM CHLORIDE 0.9 % IV SOLN
250.0000 mL | INTRAVENOUS | Status: DC | PRN
  Administered 2012-08-02: 02:00:00 via INTRAVENOUS

## 2012-08-02 MED ORDER — ARTIFICIAL TEARS OP OINT
TOPICAL_OINTMENT | Freq: Every day | OPHTHALMIC | Status: DC
  Administered 2012-08-02 – 2012-08-04 (×3): via OPHTHALMIC
  Filled 2012-08-02: qty 3.5

## 2012-08-02 MED ORDER — MIRTAZAPINE 15 MG PO TABS
15.0000 mg | ORAL_TABLET | Freq: Every day | ORAL | Status: DC
  Administered 2012-08-02 – 2012-08-04 (×3): 15 mg via ORAL
  Filled 2012-08-02 (×5): qty 1

## 2012-08-02 MED ORDER — SODIUM CHLORIDE 0.9 % IV SOLN: INTRAVENOUS | Status: DC

## 2012-08-02 MED ORDER — FOLIC ACID 1 MG PO TABS
1.0000 mg | ORAL_TABLET | Freq: Every day | ORAL | Status: DC
  Administered 2012-08-02 – 2012-08-05 (×4): 1 mg via ORAL
  Filled 2012-08-02 (×4): qty 1

## 2012-08-02 MED FILL — Medication: Qty: 1 | Status: AC

## 2012-08-02 NOTE — Progress Notes (Signed)
  Echocardiogram 2D Echocardiogram has been performed.  Arvil Chaco 08/02/2012, 3:41 PM

## 2012-08-02 NOTE — Progress Notes (Signed)
Per Dr. Tresa Endo at 0145 changed Dopamine to run through pt IO access. Per IV team nurse IO access is still good for 24 hours.    Charlane Ferretti, RN

## 2012-08-02 NOTE — H&P (Addendum)
[name] is an 67 y.o. male.   Chief Complaint:  HPI: [name] is a 67 yo man with PMH of T2DM and heart failure followed at the [place] who was BIB by EMS on bipap after calling EMS with complaints of sudden, severe SOB and generalized weakness that began several hours prior to admission. He had some associated diaphoresis with an elevated CBG at 400 per his wife 30-45 minutes prior to EMS arrival. On arrival CVG 207, patient had nausea/vomiting but no chest pain/diaphoresis and HR bradycardic. EMS gave 2 doses of atropine, his HR was 20 on arrival to the ER and dopamine gtt and transcutaneous pacemaker initiated with significant improvement in symptoms. Transfer initiated to [facility]. On arrival, dopamine gtt @ 2 mcg, no further transcutaneous pacing and patient alert, oriented to person, place, date with HR 90s-100s.    Past Medical History  Diagnosis Date  . Diabetes mellitus   . Neuropathy   . CHF (congestive heart failure)   . High cholesterol   . Hypertension   . Renal disorder   . Thyroid disease     History reviewed. No pertinent past surgical history. Mother and father with CAD/hypertension but some diabetes; no known heart block History reviewed. No pertinent family history. Social History:  reports that he has quit smoking. He does not have any smokeless tobacco history on file. He reports that he does not drink alcohol or use illicit drugs.  Allergies:  Allergies  Allergen Reactions  . Lasix (Furosemide)      (Not in a hospital admission)  Results for orders placed during the hospital encounter of 08/01/12 (from the past 48 hour(s))  GLUCOSE, CAPILLARY     Status: Abnormal   Collection Time    08/01/12  8:58 PM      Result Value Range   Glucose-Capillary 193 (*) 70 - 99 mg/dL  CBC WITH DIFFERENTIAL     Status: Abnormal   Collection Time    08/01/12  9:00 PM      Result Value Range   WBC 13.9 (*) 4.0 - 10.5 K/uL   RBC 4.19 (*) 4.22 - 5.81 MIL/uL    Hemoglobin 12.4 (*) 13.0 - 17.0 g/dL   HCT 21.3 (*) 08.6 - 57.8 %   MCV 89.0  78.0 - 100.0 fL   MCH 29.6  26.0 - 34.0 pg   MCHC 33.2  30.0 - 36.0 g/dL   RDW 46.9  62.9 - 52.8 %   Platelets 232  150 - 400 K/uL   Neutrophils Relative % 52  43 - 77 %   Neutro Abs 7.2  1.7 - 7.7 K/uL   Lymphocytes Relative 39  12 - 46 %   Lymphs Abs 5.4 (*) 0.7 - 4.0 K/uL   Monocytes Relative 5  3 - 12 %   Monocytes Absolute 0.7  0.1 - 1.0 K/uL   Eosinophils Relative 4  0 - 5 %   Eosinophils Absolute 0.5  0.0 - 0.7 K/uL   Basophils Relative 0  0 - 1 %   Basophils Absolute 0.0  0.0 - 0.1 K/uL  COMPREHENSIVE METABOLIC PANEL     Status: Abnormal   Collection Time    08/01/12  9:00 PM      Result Value Range   Sodium 139  135 - 145 mEq/L   Potassium 4.0  3.5 - 5.1 mEq/L   Chloride 102  96 - 112 mEq/L   CO2 23  19 - 32 mEq/L  Glucose, Bld 208 (*) 70 - 99 mg/dL   BUN 38 (*) 6 - 23 mg/dL   Creatinine, Ser 4.54 (*) 0.50 - 1.35 mg/dL   Calcium 9.8  8.4 - 09.8 mg/dL   Total Protein 7.7  6.0 - 8.3 g/dL   Albumin 3.5  3.5 - 5.2 g/dL   AST 15  0 - 37 U/L   ALT 15  0 - 53 U/L   Alkaline Phosphatase 110  39 - 117 U/L   Total Bilirubin 0.2 (*) 0.3 - 1.2 mg/dL   GFR calc non Af Amer 32 (*) >90 mL/min   GFR calc Af Amer 38 (*) >90 mL/min   Comment:            The eGFR has been calculated     using the CKD EPI equation.     This calculation has not been     validated in all clinical     situations.     eGFR's persistently     <90 mL/min signify     possible Chronic Kidney Disease.  PRO B NATRIURETIC PEPTIDE     Status: Abnormal   Collection Time    08/01/12  9:00 PM      Result Value Range   Pro B Natriuretic peptide (BNP) 626.6 (*) 0 - 125 pg/mL  TROPONIN I     Status: None   Collection Time    08/01/12  9:00 PM      Result Value Range   Troponin I <0.30  <0.30 ng/mL   Comment:            Due to the release kinetics of cTnI,     a negative result within the first hours     of the onset of  symptoms does not rule out     myocardial infarction with certainty.     If myocardial infarction is still suspected,     repeat the test at appropriate intervals.  PROTIME-INR     Status: None   Collection Time    08/01/12  9:00 PM      Result Value Range   Prothrombin Time 13.9  11.6 - 15.2 seconds   INR 1.08  0.00 - 1.49  BLOOD GAS, ARTERIAL     Status: Abnormal   Collection Time    08/01/12  9:40 PM      Result Value Range   FIO2 100.00     Delivery systems VENTILATOR     Mode BILEVEL POSITIVE AIRWAY PRESSURE     Rate 14     Inspiratory PAP 12     Expiratory PAP 4     pH, Arterial 7.284 (*) 7.350 - 7.450   pCO2 arterial 45.1 (*) 35.0 - 45.0 mmHg   pO2, Arterial 287.0 (*) 80.0 - 100.0 mmHg   Bicarbonate 20.7  20.0 - 24.0 mEq/L   TCO2 18.9  0 - 100 mmol/L   Acid-base deficit 4.8 (*) 0.0 - 2.0 mmol/L   O2 Saturation 99.3     Patient temperature 37.0     Collection site RADIAL     Drawn by COLLECTED BY RT     Sample type ARTERIAL     Allens test (pass/fail) PASS  PASS   Dg Chest Portable 1 View  08/01/2012   *RADIOLOGY REPORT*  Clinical Data: Respiratory distress.  PORTABLE CHEST - 1 VIEW  Comparison: Chest x-ray 05/31/2011.  Findings: Lung volumes are very low.  Film is under penetrated, which limits the  diagnostics sensitivity and specificity of this examination.  With this limitation in mind, the opacities in the lung bases bilaterally (left greater than right), are favored to promptly reflect subsegmental atelectasis.  No definite consolidative airspace disease.  Potential small left pleural effusion (although this may be artifactual).  Pulmonary venous congestion, without frank pulmonary edema (likely accentuated by low lung volumes).  Mild cardiomegaly is unchanged.  Upper mediastinal contours are distorted by low lung volumes and lordotic positioning.  Atherosclerosis of the thoracic aorta. Transcutaneous defibrillator pad noted over the lower thorax.  IMPRESSION: 1.  Low  lung volumes with probable bibasilar subsegmental atelectasis and potential small left pleural effusion. 2.  Cardiomegaly. 3.  Atherosclerosis.   Original Report Authenticated By: Trudie Reed, M.D.    Review of Systems  Constitutional: Positive for malaise/fatigue. Negative for fever and chills.  HENT: Negative for hearing loss and neck pain.   Eyes: Negative for pain and discharge.  Respiratory: Positive for shortness of breath. Negative for hemoptysis.   Cardiovascular: Negative for chest pain and palpitations.  Gastrointestinal: Positive for heartburn. Negative for vomiting, abdominal pain and diarrhea.  Genitourinary: Negative for frequency and hematuria.  Musculoskeletal: Positive for joint pain. Negative for myalgias.  Skin: Negative for itching and rash.  Neurological: Positive for dizziness and weakness. Negative for tingling, tremors, sensory change and headaches.  Endo/Heme/Allergies: Negative for environmental allergies and polydipsia.  Psychiatric/Behavioral: Negative for suicidal ideas, hallucinations and substance abuse.    Blood pressure 146/64, pulse 64, temperature 99.1 F (37.3 C), temperature source Oral, resp. rate 17, height 5\' 10"  (1.778 m), weight 113.399 kg (250 lb), SpO2 94.00%. Physical Exam  Nursing note and vitals reviewed. Constitutional: He is oriented to person, place, and time. He appears well-developed and well-nourished.  Appears older than stated age  HENT:  Head: Normocephalic and atraumatic.  Nose: Nose normal.  Mouth/Throat: Oropharynx is clear and moist. No oropharyngeal exudate.  Eyes: Conjunctivae and EOM are normal. Pupils are equal, round, and reactive to light. No scleral icterus.  Neck: Normal range of motion. Neck supple. No tracheal deviation present. No thyromegaly present.  JVP 1-2 above clavicle at 60 degrees  Cardiovascular: Regular rhythm, normal heart sounds and intact distal pulses.  Exam reveals no gallop.   No murmur  heard. Tachycardic, appears regular  Respiratory: Effort normal. He has no wheezes. He has rales.  Occasional scattered bibasilar rales  GI: Soft. Bowel sounds are normal. He exhibits no distension. There is no tenderness. There is no rebound.  obese  Musculoskeletal: Normal range of motion. He exhibits no edema and no tenderness.  Neurological: He is alert and oriented to person, place, and time. No cranial nerve deficit. Coordination normal.  Skin: No rash noted. No erythema.  lukewarm  Psychiatric: His behavior is normal. Thought content normal.    Prior ECG - LBBB from 4/13; ECG on arrival at [facility]; idioventricular rhythm in at 30 Labs reviwed; wbc 13.9, h/h 12.4/37.3, plt 232 Troponin < 0.3, INR 1.08, ProBNP 627 Na 139, K 4.0, bun/cr 38/2 abg 7.284/45/287  Problem list Complete heart Block Leukocytosis Elevated creatinine baseline 1.5 now 2.0, acute on chronic renal failure Prior MI with LHC but no known stents Prior LBBB on old ECG  Assessment/Plan 67 yo man with PMH of chronic kidney disease, T2DM, heart failure who was BIBA with complete heart block per report but certainly slow idioventricular rhythm/bradycardia to the 20s requiring temporary transcutaneous pacemaker but now tolerating dopamine at very low dose. He feels much improved,  hemodynamically stable on dopamine 2 mcg. Will evaluate for other etiologies of heart block, conduction disease such as medications, infections, structural heart disease, ischemia. He will likely end up needing a permanent pacemaker.  - CCU/ICU - telemetry - pacer pads on - dopamine gtt for pacing; had planned temporary transvenous wire but no issues at all on low dose dopamine - trend troponins, urinalysis, update lipid panel, tsh, hba1c, bnp - blood cultures x2 - review medications with wife/family if/when she arrives - holding all av nodal blockers now - holding metoprolol 12.5 mg po bid - holding zoloft 100 mg - insulin lantus not given  tonight (believe he already received at home), reduced dose to 30 from 45 units and daily lantus with meals written for 15 units instead of 20 however he is also NPO and insulin ss q4h protocol written for  Amala Petion 08/02/2012, 12:55 AM

## 2012-08-02 NOTE — Consult Note (Signed)
ELECTROPHYSIOLOGY CONSULT NOTE  Patient ID: [name], MRN: [id number], DOB/AGE: 08/03/1945 67 y.o. Admit date: 08/01/2012 Date of Consult: 08/02/2012  Primary Physician: No primary provider on file. [facility]  Chief Complaint:  Intermittent complete heart block   HPI [name] is a 67 y.o. male  Request of Dr. Donnie Aho because of an episode of intermittent complete heart block in the context of chronic left bundle branch block.  The patient has an unclear past cardiac history. He carries a diagnosis of congestive heart failure. He uses it he has had an MI but that last year was told that there was "no heart damage". He has diabetes on long-standing as well as hypertension and renal insufficiency with a creatinine of both 2. This in the past had informed decision not to pursue catheterization.  He is disabled. He walks with a walker and uses a lift chair. He is morbidly obese. He is on pulse with peripheral edema. He has marked limitations in his ability to ambulate. He has not had syncope. Yesterday he had eaten relatively normal first part of the day , he was sitting at you dinner when his wife heard a noise. She can't and found him slumped week but responsive. She took the fluid out of his mouth but he remained very weak. She says that he did not lose consciousness. EMS was called. The details are not available but he was taken to hospital and was noted at some point to have complete heart block with an escape rate in the 20-30 range. Interosseous needle was placed,  atropine and dopamine were given with return of pulse and pressure  He was transferred    Past Medical History  Diagnosis Date  . Diabetes mellitus   . Neuropathy   . CHF (congestive heart failure)   . High cholesterol   . Hypertension       Surgical History: History reviewed. No pertinent past surgical history.   Home Meds: Prior to Admission medications   Medication Sig Start  Date End Date Taking? Authorizing Provider  albuterol-ipratropium (COMBIVENT) 18-103 MCG/ACT inhaler Inhale 1 puff into the lungs 4 (four) times daily.   Yes Historical Provider, MD  aspirin EC 81 MG tablet Take 81 mg by mouth daily.   Yes Historical Provider, MD  atorvastatin (LIPITOR) 80 MG tablet Take 40 mg by mouth at bedtime.   Yes Historical Provider, MD  Cholecalciferol (VITAMIN D) 2000 UNITS CAPS Take 2,000 Units by mouth daily.   Yes Historical Provider, MD  ferrous sulfate 325 (65 FE) MG tablet Take 325 mg by mouth 2 (two) times daily.   Yes Historical Provider, MD  fish oil-omega-3 fatty acids 1000 MG capsule Take 1 g by mouth 2 (two) times daily.   Yes Historical Provider, MD  folic acid (FOLVITE) 1 MG tablet Take 1 mg by mouth daily.   Yes Historical Provider, MD  insulin aspart (NOVOLOG) 100 UNIT/ML injection Inject 20 Units into the skin 3 (three) times daily before meals.   Yes Historical Provider, MD  insulin glargine (LANTUS) 100 UNIT/ML injection Inject 45 Units into the skin at bedtime.   Yes Historical Provider, MD  ketotifen (ZADITOR) 0.025 % ophthalmic solution Place 1 drop into both eyes 2 (two) times daily as needed.   Yes Historical Provider, MD  levothyroxine (SYNTHROID, LEVOTHROID) 25 MCG tablet Take 25 mcg by mouth every morning.   Yes Historical Provider, MD  metoprolol tartrate (LOPRESSOR) 25 MG tablet Take 12.5  mg by mouth 2 (two) times daily.   Yes Historical Provider, MD  mirtazapine (REMERON) 15 MG tablet Take 15 mg by mouth at bedtime.   Yes Historical Provider, MD  omeprazole (PRILOSEC) 20 MG capsule Take 20 mg by mouth daily.   Yes Historical Provider, MD  pentoxifylline (TRENTAL) 400 MG CR tablet Take 400 mg by mouth 3 (three) times daily after meals.   Yes Historical Provider, MD  sertraline (ZOLOFT) 100 MG tablet Take 100 mg by mouth daily.   Yes Historical Provider, MD  thiamine (VITAMIN B-1) 100 MG tablet Take 100 mg by mouth daily.   Yes Historical  Provider, MD  White Petrolatum-Mineral Oil (ARTIFICIAL TEARS) OINT ophthalmic ointment Place 1 application into both eyes every evening.   Yes Historical Provider, MD    Inpatient Medications:  . artificial tears   Both Eyes QHS  . aspirin EC  81 mg Oral Daily  . atorvastatin  40 mg Oral QHS  . Chlorhexidine Gluconate Cloth  6 each Topical Q0600  . cholecalciferol  2,000 Units Oral Daily  . ferrous sulfate  325 mg Oral BID WC  . folic acid  1 mg Oral Daily  . heparin  5,000 Units Subcutaneous Q8H  . insulin aspart  15 Units Subcutaneous TID AC  . [START ON 08/03/2012] insulin glargine  30 Units Subcutaneous QHS  . Ipratropium-Albuterol  1 puff Inhalation QID  . levothyroxine  25 mcg Oral QAC breakfast  . mirtazapine  15 mg Oral QHS  . mupirocin ointment  1 application Nasal BID  . omega-3 acid ethyl esters  1 g Oral BID  . pantoprazole  40 mg Oral Daily  . pentoxifylline  400 mg Oral TID PC  . sodium chloride  3 mL Intravenous Q12H  . thiamine  100 mg Oral Daily     Allergies:  Allergies  Allergen Reactions  . Lasix (Furosemide)     History   Social History  . Marital Status: Married    Spouse Name: N/A    Number of Children: N/A  . Years of Education: N/A   Occupational History  . Not on file.   Social History Main Topics  . Smoking status: Former Games developer  . Smokeless tobacco: Not on file  . Alcohol Use: No  . Drug Use: No  . Sexually Active:    Other Topics Concern  . Not on file   Social History Narrative  . No narrative on file     History reviewed. No pertinent family history.   ROS:  Please see the history of present illness.   Negative except gerd depression left eye [ptosis  All other systems reviewed and negative.    Physical Exam:   Blood pressure 131/72, pulse 82, temperature 98.5 F (36.9 C), temperature source Oral, resp. rate 21, height 5\' 11"  (1.803 m), weight 253 lb 4.9 oz (114.9 kg), SpO2 98.00%. General: Well developed, morbidly obese   male in no acute distress. Head: Normocephalic, atraumatic, sclera non-icteric, no xanthomas, nares are without discharge.lying flat in bed  EENT: normal  l eye ptosis Lymph Nodes:  none Back:  Neck: Negative for carotid bruits. JVD not elevated. Lungs: Clear bilaterally to auscultation without wheezes, rales, or rhonchi. Breathing is unlabored. Heart: RRR with S1 S2. No  murmur , rubs, or gallops appreciated. Abdomen: Soft, non-tender, non-distended with normoactive bowel sounds. No hepatomegaly. No rebound/guarding. No obvious abdominal masses. Msk:  Strength and tone appear normal for age. Extremities: No clubbing or cyanosis.  1+  edema.  Distal pedal pulses are 2+ and equal bilaterally. intaosseous in left shin Skin: Warm and Dry Neuro: Alert and oriented X 3. CN III-XII intact Grossly normal sensory and motor function .speech sluggish and slow Psych:  Responds to questions appropriately with a normal affect.      Labs: Cardiac Enzymes  Recent Labs  08/01/12 2100 08/02/12 0301 08/02/12 0815  TROPONINI <0.30 <0.30 <0.30   CBC Lab Results  Component Value Date   WBC 16.5* 08/02/2012   HGB 13.1 08/02/2012   HCT 37.4* 08/02/2012   MCV 86.6 08/02/2012   PLT PLATELET CLUMPS NOTED ON SMEAR, COUNT APPEARS ADEQUATE 08/02/2012   PROTIME:  Recent Labs  08/01/12 2100 08/02/12 0301  LABPROT 13.9 13.2  INR 1.08 1.01   Chemistry  Recent Labs Lab 08/02/12 0301  NA 141  K 5.0  CL 105  CO2 23  BUN 39*  CREATININE 2.25*  CALCIUM 9.6  PROT 7.6  BILITOT 0.2*  ALKPHOS 98  ALT 14  AST 15  GLUCOSE 94   Lipids No results found for this basename: CHOL, HDL, LDLCALC, TRIG   BNP Pro B Natriuretic peptide (BNP)  Date/Time Value Range Status  08/02/2012  3:01 AM 1103.0* 0 - 125 pg/mL Final  08/01/2012  9:00 PM 626.6* 0 - 125 pg/mL Final   Miscellaneous No results found for this basename: DDIMER    Radiology/Studies:  Dg Chest Portable 1 View  08/01/2012   *RADIOLOGY  REPORT*  Clinical Data: Respiratory distress.  PORTABLE CHEST - 1 VIEW  Comparison: Chest x-ray 05/31/2011.  Findings: Lung volumes are very low.  Film is under penetrated, which limits the diagnostics sensitivity and specificity of this examination.  With this limitation in mind, the opacities in the lung bases bilaterally (left greater than right), are favored to promptly reflect subsegmental atelectasis.  No definite consolidative airspace disease.  Potential small left pleural effusion (although this may be artifactual).  Pulmonary venous congestion, without frank pulmonary edema (likely accentuated by low lung volumes).  Mild cardiomegaly is unchanged.  Upper mediastinal contours are distorted by low lung volumes and lordotic positioning.  Atherosclerosis of the thoracic aorta. Transcutaneous defibrillator pad noted over the lower thorax.  IMPRESSION: 1.  Low lung volumes with probable bibasilar subsegmental atelectasis and potential small left pleural effusion. 2.  Cardiomegaly. 3.  Atherosclerosis.   Original Report Authenticated By: Trudie Reed, M.D.    EKG: Sinus rhythm with left bundle branch block with QRS duration that rsng from 140-165 ms  Assessment and Plan:    Principal Problem:   Complete heart block, transient Active Problems:   Chronic kidney disease (CKD), stage III (moderate)   LBBB (left bundle branch block)   Bradycardia   Leukocytosis   DM (diabetes mellitus) with complications   Patient presents transient complete heart block treated currently with dopamine support. There is no identifiable reversible cause for this event; it occurs in the context of left bundle branch block. He has multiple cardiac risk factors and apparently has a history of an MI. Ejection fraction will be important to identify appropriate device therapy. He tells [place] that there was "no damage to his heart" that lasted evaluation.  In the event that his ejection fraction is 45+, dual-chamber pacing is  reasonable. If it is in the 35-45% range CRT pacing may be appropriate and below that range CRT ICD would be appropriate to consider. We'll await the echo.  Recurrent nausea and vomiting is chronic. I don't think  that the elevated white count is precluding at this time.  He probably needs a sleep study. Sherryl Manges

## 2012-08-02 NOTE — Progress Notes (Signed)
UR Completed.  [name] [phone] 08/02/2012  

## 2012-08-02 NOTE — Progress Notes (Signed)
Pt had on brief which was saturated upon arrival. Pt has not voided since condom cath placed. Bladder scanned at 0600 pt. 132 cc noted in bladder.  Charlane Ferretti, RN

## 2012-08-02 NOTE — Progress Notes (Signed)
Subjective:  Patient seen this morning. He is nauseated and is vomiting. He presented to [facility] last night with severe shortness of breath and was in complete heart block on arrival and had transcutaneous pacemaking, was given atropine and later dopamine. When he arrived here he was in sinus rhythm in the pacemaking was discontinued. His cardiac enzymes have been negative in his BNP was only mildly elevated.  Objective:  Vital Signs in the last 24 hours: BP 131/72  Pulse 82  Temp(Src) 98.5 F (36.9 C) (Oral)  Resp 21  Ht 5\' 11"  (1.803 m)  Wt 114.9 kg (253 lb 4.9 oz)  BMI 35.35 kg/m2  SpO2 98%  Physical Exam: Very obese white male who was vomiting when I walked in the room Lungs:  Clear  Cardiac:  Distant heart sounds Regular rhythm, normal S1 and S2, no S3 Abdomen:  Soft, nontender, no masses Extremities:  No edema present  Intake/Output from previous day: 06/15 0701 - 06/16 0700 In: 35.5 [I.V.:35.5] Out: -  Weight Filed Weights   08/01/12 2055 08/02/12 0200  Weight: 113.399 kg (250 lb) 114.9 kg (253 lb 4.9 oz)    Lab Results: Basic Metabolic Panel:  Recent Labs  16/10/96 2100 08/02/12 0301  NA 139 141  K 4.0 5.0  CL 102 105  CO2 23 23  GLUCOSE 208* 94  BUN 38* 39*  CREATININE 2.03* 2.25*    CBC:  Recent Labs  08/01/12 2100 08/02/12 0301  WBC 13.9* 16.5*  NEUTROABS 7.2 13.0*  HGB 12.4* 13.1  HCT 37.3* 37.4*  MCV 89.0 86.6  PLT 232 PLATELET CLUMPS NOTED ON SMEAR, COUNT APPEARS ADEQUATE    BNP    Component Value Date/Time   PROBNP 1103.0* 08/02/2012 0301    PROTIME: Lab Results  Component Value Date   INR 1.01 08/02/2012   INR 1.08 08/01/2012    Telemetry: Sinus rhythm with left bundle branch block pattern  Assessment/Plan:  1. Transient complete heart block on presentation to the emergency room that has resolved 2. Left bundle branch block chronic 3. Diabetes mellitus with complications 4. Severe  obesity  Recommendations:  Check echocardiogram this morning. Electrophysiology consultation.       Darden Palmer  MD [facility]  08/02/2012, 10:05 AM

## 2012-08-03 ENCOUNTER — Encounter (HOSPITAL_COMMUNITY)
Admission: AD | Disposition: A | Discharge status: Home / Self Care | Payer: Self-pay | Source: Home / Self Care | Attending: Cardiology

## 2012-08-03 DIAGNOSIS — I442 Atrioventricular block, complete: Secondary | ICD-10-CM

## 2012-08-03 DIAGNOSIS — I119 Hypertensive heart disease without heart failure: Secondary | ICD-10-CM | POA: Diagnosis present

## 2012-08-03 DIAGNOSIS — E669 Obesity, unspecified: Secondary | ICD-10-CM | POA: Insufficient documentation

## 2012-08-03 DIAGNOSIS — Z950 Presence of cardiac pacemaker: Secondary | ICD-10-CM

## 2012-08-03 HISTORY — DX: Presence of cardiac pacemaker: Z95.0

## 2012-08-03 HISTORY — PX: PERMANENT PACEMAKER INSERTION: SHX5480

## 2012-08-03 LAB — GLUCOSE, CAPILLARY
Glucose-Capillary: 160 mg/dL — ABNORMAL HIGH (ref 70–99)
Glucose-Capillary: 264 mg/dL — ABNORMAL HIGH (ref 70–99)
Glucose-Capillary: 269 mg/dL — ABNORMAL HIGH (ref 70–99)

## 2012-08-03 LAB — BASIC METABOLIC PANEL
CO2: 24 mEq/L (ref 19–32)
Calcium: 9.4 mg/dL (ref 8.4–10.5)
GFR calc non Af Amer: 33 mL/min — ABNORMAL LOW (ref >90)
Sodium: 139 mEq/L (ref 135–145)

## 2012-08-03 LAB — HEMOGLOBIN A1C
Hgb A1c MFr Bld: 7.1 % — ABNORMAL HIGH (ref <5.7)
Mean Plasma Glucose: 157 mg/dL — ABNORMAL HIGH (ref <117)

## 2012-08-03 SURGERY — PERMANENT PACEMAKER INSERTION
Anesthesia: LOCAL

## 2012-08-03 MED ORDER — CEFAZOLIN SODIUM 1-5 GM-% IV SOLN
1.0000 g | Freq: Four times a day (QID) | INTRAVENOUS | Status: AC
  Administered 2012-08-03 – 2012-08-04 (×2): 1 g via INTRAVENOUS
  Filled 2012-08-03 (×3): qty 50

## 2012-08-03 MED ORDER — METOPROLOL TARTRATE 1 MG/ML IV SOLN
2.5000 mg | INTRAVENOUS | Status: DC | PRN
  Administered 2012-08-04: 2.5 mg via INTRAVENOUS
  Filled 2012-08-03: qty 5

## 2012-08-03 MED ORDER — INSULIN ASPART 100 UNIT/ML ~~LOC~~ SOLN
0.0000 [IU] | Freq: Three times a day (TID) | SUBCUTANEOUS | Status: DC
  Administered 2012-08-04 (×2): 2 [IU] via SUBCUTANEOUS
  Administered 2012-08-04: 11 [IU] via SUBCUTANEOUS
  Administered 2012-08-05: 3 [IU] via SUBCUTANEOUS

## 2012-08-03 MED ORDER — MIDAZOLAM HCL 5 MG/5ML IJ SOLN
INTRAMUSCULAR | Status: AC
  Filled 2012-08-03: qty 5

## 2012-08-03 MED ORDER — INSULIN ASPART 100 UNIT/ML ~~LOC~~ SOLN
0.0000 [IU] | Freq: Every day | SUBCUTANEOUS | Status: DC
  Administered 2012-08-03: 2 [IU] via SUBCUTANEOUS

## 2012-08-03 MED ORDER — INSULIN ASPART 100 UNIT/ML ~~LOC~~ SOLN
10.0000 [IU] | Freq: Three times a day (TID) | SUBCUTANEOUS | Status: DC
  Administered 2012-08-03 – 2012-08-05 (×5): 10 [IU] via SUBCUTANEOUS

## 2012-08-03 MED ORDER — HEPARIN (PORCINE) IN NACL 2-0.9 UNIT/ML-% IJ SOLN
INTRAMUSCULAR | Status: AC
  Filled 2012-08-03: qty 500

## 2012-08-03 MED ORDER — ACETAMINOPHEN 325 MG PO TABS: 325.0000 mg | ORAL_TABLET | ORAL | Status: DC | PRN

## 2012-08-03 MED ORDER — FENTANYL CITRATE 0.05 MG/ML IJ SOLN
INTRAMUSCULAR | Status: AC
  Filled 2012-08-03: qty 2

## 2012-08-03 MED ORDER — LIDOCAINE HCL (PF) 1 % IJ SOLN
INTRAMUSCULAR | Status: AC
  Filled 2012-08-03: qty 60

## 2012-08-03 MED ORDER — SODIUM CHLORIDE 0.9 % IV SOLN: INTRAVENOUS | Status: AC

## 2012-08-03 NOTE — Progress Notes (Signed)
Found to be on afib  , rate of 90's, asymptomatic,BP- 170's  Systolic., PA made aware with order. After few min. Pt converted to nsr.

## 2012-08-03 NOTE — Interval H&P Note (Signed)
History and Physical Interval Note:  08/03/2012 10:17 AM  [name]  has presented today for surgery, with the diagnosis of a  The various methods of treatment have been discussed with the patient and family. After consideration of risks, benefits and other options for treatment, the patient has consented to  Procedure(s): PERMANENT PACEMAKER INSERTION (N/A) as a surgical intervention .  The patient's history has been reviewed, patient examined, no change in status, stable for surgery.  I have reviewed the patient's chart and labs.  Questions were answered to the patient's satisfaction.     Sherryl Manges

## 2012-08-03 NOTE — Progress Notes (Signed)
Transferred -in from cath lab sp pm insertion, bedrest emphasized, left arm sling applied. Denied any discomfort, continue to monitor.

## 2012-08-03 NOTE — H&P (View-Only) (Signed)
 ELECTROPHYSIOLOGY CONSULT NOTE  Patient ID: [name], MRN: [id number], DOB/AGE: 11/02/1945 66 y.o. Admit date: 08/01/2012 Date of Consult: 08/02/2012  Primary Physician: No primary provider on file. [facility]  Chief Complaint:  Intermittent complete heart block   HPI [name] is a 66 y.o. male  Request of Dr. Tilley because of an episode of intermittent complete heart block in the context of chronic left bundle branch block.  The patient has an unclear past cardiac history. He carries a diagnosis of congestive heart failure. He uses it he has had an MI but that last year was told that there was "no heart damage". He has diabetes on long-standing as well as hypertension and renal insufficiency with a creatinine of both 2. This in the past had informed decision not to pursue catheterization.  He is disabled. He walks with a walker and uses a lift chair. He is morbidly obese. He is on pulse with peripheral edema. He has marked limitations in his ability to ambulate. He has not had syncope. Yesterday he had eaten relatively normal first part of the day , he was sitting at you dinner when his wife heard a noise. She can't and found him slumped week but responsive. She took the fluid out of his mouth but he remained very weak. She says that he did not lose consciousness. EMS was called. The details are not available but he was taken to hospital and was noted at some point to have complete heart block with an escape rate in the 20-30 range. Interosseous needle was placed,  atropine and dopamine were given with return of pulse and pressure  He was transferred    Past Medical History  Diagnosis Date  . Diabetes mellitus   . Neuropathy   . CHF (congestive heart failure)   . High cholesterol   . Hypertension       Surgical History: History reviewed. No pertinent past surgical history.   Home Meds: Prior to Admission medications   Medication Sig Start  Date End Date Taking? Authorizing Provider  albuterol-ipratropium (COMBIVENT) 18-103 MCG/ACT inhaler Inhale 1 puff into the lungs 4 (four) times daily.   Yes Historical Provider, MD  aspirin EC 81 MG tablet Take 81 mg by mouth daily.   Yes Historical Provider, MD  atorvastatin (LIPITOR) 80 MG tablet Take 40 mg by mouth at bedtime.   Yes Historical Provider, MD  Cholecalciferol (VITAMIN D) 2000 UNITS CAPS Take 2,000 Units by mouth daily.   Yes Historical Provider, MD  ferrous sulfate 325 (65 FE) MG tablet Take 325 mg by mouth 2 (two) times daily.   Yes Historical Provider, MD  fish oil-omega-3 fatty acids 1000 MG capsule Take 1 g by mouth 2 (two) times daily.   Yes Historical Provider, MD  folic acid (FOLVITE) 1 MG tablet Take 1 mg by mouth daily.   Yes Historical Provider, MD  insulin aspart (NOVOLOG) 100 UNIT/ML injection Inject 20 Units into the skin 3 (three) times daily before meals.   Yes Historical Provider, MD  insulin glargine (LANTUS) 100 UNIT/ML injection Inject 45 Units into the skin at bedtime.   Yes Historical Provider, MD  ketotifen (ZADITOR) 0.025 % ophthalmic solution Place 1 drop into both eyes 2 (two) times daily as needed.   Yes Historical Provider, MD  levothyroxine (SYNTHROID, LEVOTHROID) 25 MCG tablet Take 25 mcg by mouth every morning.   Yes Historical Provider, MD  metoprolol tartrate (LOPRESSOR) 25 MG tablet Take 12.5   mg by mouth 2 (two) times daily.   Yes Historical Provider, MD  mirtazapine (REMERON) 15 MG tablet Take 15 mg by mouth at bedtime.   Yes Historical Provider, MD  omeprazole (PRILOSEC) 20 MG capsule Take 20 mg by mouth daily.   Yes Historical Provider, MD  pentoxifylline (TRENTAL) 400 MG CR tablet Take 400 mg by mouth 3 (three) times daily after meals.   Yes Historical Provider, MD  sertraline (ZOLOFT) 100 MG tablet Take 100 mg by mouth daily.   Yes Historical Provider, MD  thiamine (VITAMIN B-1) 100 MG tablet Take 100 mg by mouth daily.   Yes Historical  Provider, MD  White Petrolatum-Mineral Oil (ARTIFICIAL TEARS) OINT ophthalmic ointment Place 1 application into both eyes every evening.   Yes Historical Provider, MD    Inpatient Medications:  . artificial tears   Both Eyes QHS  . aspirin EC  81 mg Oral Daily  . atorvastatin  40 mg Oral QHS  . Chlorhexidine Gluconate Cloth  6 each Topical Q0600  . cholecalciferol  2,000 Units Oral Daily  . ferrous sulfate  325 mg Oral BID WC  . folic acid  1 mg Oral Daily  . heparin  5,000 Units Subcutaneous Q8H  . insulin aspart  15 Units Subcutaneous TID AC  . [START ON 08/03/2012] insulin glargine  30 Units Subcutaneous QHS  . Ipratropium-Albuterol  1 puff Inhalation QID  . levothyroxine  25 mcg Oral QAC breakfast  . mirtazapine  15 mg Oral QHS  . mupirocin ointment  1 application Nasal BID  . omega-3 acid ethyl esters  1 g Oral BID  . pantoprazole  40 mg Oral Daily  . pentoxifylline  400 mg Oral TID PC  . sodium chloride  3 mL Intravenous Q12H  . thiamine  100 mg Oral Daily     Allergies:  Allergies  Allergen Reactions  . Lasix (Furosemide)     History   Social History  . Marital Status: Married    Spouse Name: N/A    Number of Children: N/A  . Years of Education: N/A   Occupational History  . Not on file.   Social History Main Topics  . Smoking status: Former Smoker  . Smokeless tobacco: Not on file  . Alcohol Use: No  . Drug Use: No  . Sexually Active:    Other Topics Concern  . Not on file   Social History Narrative  . No narrative on file     History reviewed. No pertinent family history.   ROS:  Please see the history of present illness.   Negative except gerd depression left eye [ptosis  All other systems reviewed and negative.    Physical Exam:   Blood pressure 131/72, pulse 82, temperature 98.5 F (36.9 C), temperature source Oral, resp. rate 21, height 5' 11" (1.803 m), weight 253 lb 4.9 oz (114.9 kg), SpO2 98.00%. General: Well developed, morbidly obese   male in no acute distress. Head: Normocephalic, atraumatic, sclera non-icteric, no xanthomas, nares are without discharge.lying flat in bed  EENT: normal  l eye ptosis Lymph Nodes:  none Back:  Neck: Negative for carotid bruits. JVD not elevated. Lungs: Clear bilaterally to auscultation without wheezes, rales, or rhonchi. Breathing is unlabored. Heart: RRR with S1 S2. No  murmur , rubs, or gallops appreciated. Abdomen: Soft, non-tender, non-distended with normoactive bowel sounds. No hepatomegaly. No rebound/guarding. No obvious abdominal masses. Msk:  Strength and tone appear normal for age. Extremities: No clubbing or cyanosis.   1+  edema.  Distal pedal pulses are 2+ and equal bilaterally. intaosseous in left shin Skin: Warm and Dry Neuro: Alert and oriented X 3. CN III-XII intact Grossly normal sensory and motor function .speech sluggish and slow Psych:  Responds to questions appropriately with a normal affect.      Labs: Cardiac Enzymes  Recent Labs  08/01/12 2100 08/02/12 0301 08/02/12 0815  TROPONINI <0.30 <0.30 <0.30   CBC Lab Results  Component Value Date   WBC 16.5* 08/02/2012   HGB 13.1 08/02/2012   HCT 37.4* 08/02/2012   MCV 86.6 08/02/2012   PLT PLATELET CLUMPS NOTED ON SMEAR, COUNT APPEARS ADEQUATE 08/02/2012   PROTIME:  Recent Labs  08/01/12 2100 08/02/12 0301  LABPROT 13.9 13.2  INR 1.08 1.01   Chemistry  Recent Labs Lab 08/02/12 0301  NA 141  K 5.0  CL 105  CO2 23  BUN 39*  CREATININE 2.25*  CALCIUM 9.6  PROT 7.6  BILITOT 0.2*  ALKPHOS 98  ALT 14  AST 15  GLUCOSE 94   Lipids No results found for this basename: CHOL, HDL, LDLCALC, TRIG   BNP Pro B Natriuretic peptide (BNP)  Date/Time Value Range Status  08/02/2012  3:01 AM 1103.0* 0 - 125 pg/mL Final  08/01/2012  9:00 PM 626.6* 0 - 125 pg/mL Final   Miscellaneous No results found for this basename: DDIMER    Radiology/Studies:  Dg Chest Portable 1 View  08/01/2012   *RADIOLOGY  REPORT*  Clinical Data: Respiratory distress.  PORTABLE CHEST - 1 VIEW  Comparison: Chest x-ray 05/31/2011.  Findings: Lung volumes are very low.  Film is under penetrated, which limits the diagnostics sensitivity and specificity of this examination.  With this limitation in mind, the opacities in the lung bases bilaterally (left greater than right), are favored to promptly reflect subsegmental atelectasis.  No definite consolidative airspace disease.  Potential small left pleural effusion (although this may be artifactual).  Pulmonary venous congestion, without frank pulmonary edema (likely accentuated by low lung volumes).  Mild cardiomegaly is unchanged.  Upper mediastinal contours are distorted by low lung volumes and lordotic positioning.  Atherosclerosis of the thoracic aorta. Transcutaneous defibrillator pad noted over the lower thorax.  IMPRESSION: 1.  Low lung volumes with probable bibasilar subsegmental atelectasis and potential small left pleural effusion. 2.  Cardiomegaly. 3.  Atherosclerosis.   Original Report Authenticated By: Daniel Entrikin, M.D.    EKG: Sinus rhythm with left bundle branch block with QRS duration that rsng from 140-165 ms  Assessment and Plan:    Principal Problem:   Complete heart block, transient Active Problems:   Chronic kidney disease (CKD), stage III (moderate)   LBBB (left bundle branch block)   Bradycardia   Leukocytosis   DM (diabetes mellitus) with complications   Patient presents transient complete heart block treated currently with dopamine support. There is no identifiable reversible cause for this event; it occurs in the context of left bundle branch block. He has multiple cardiac risk factors and apparently has a history of an MI. Ejection fraction will be important to identify appropriate device therapy. He tells us that there was "no damage to his heart" that lasted evaluation.  In the event that his ejection fraction is 45+, dual-chamber pacing is  reasonable. If it is in the 35-45% range CRT pacing may be appropriate and below that range CRT ICD would be appropriate to consider. We'll await the echo.  Recurrent nausea and vomiting is chronic. I don't think   that the elevated white count is precluding at this time.  He probably needs a [name] study. Steven Klein   

## 2012-08-03 NOTE — Progress Notes (Signed)
Called by RN re: new atrial fibrillation  Pt had PPM placed this pm, a short time ago, he developed atrial fibrillation, HR 90s. BP normal to high. He is asymptomatic.  Advised her to get a 12-lead and make sure this is really afib.  Added PRN Lopressor IV for HR sustained > 120  No anticoagulation because of the device placed today.  Continue to monitor and call back for additional changes.

## 2012-08-03 NOTE — CV Procedure (Signed)
Preop DX::CHB Post op DX:: same  Procedure  dual pacemaker implantation  After routine prep and drape, lidocaine was infiltrated in the prepectoral subclavicular region on the left side an incision was made and carried down to later the prepectoral fascia using electrocautery and sharp dissection a pocket was formed similarly. Hemostasis was obtained.  After this, we turned our attention to gaining accessm to the extrathoracic,left subclavian vein. This was accomplished without difficulty and without the aspiration of air or puncture of the artery. 2 separate venipunctures were accomplished; guidewires were placed and retained and sequentially 7 French sheath through which were  passed an Medtronic 5076  ventricular lead serial [id number] and an Medtronic 5076 atrial lead serial number [id number] .  The ventricular lead was manipulated to the right ventricular apex with a bipolar R wave was 10.8, the pacing impedance was 1327, the threshold was 1.2 @ 0.5 msec  Current at threshold was   1.0  Ma and the current of injury was  brisk.  The right atrial lead was manipulated to the right atrial appendage  with a bipolar P-wave  3.9, the pacing impedance was 701, the threshold 2@ 0.5 msec   Current at threshold was 3  Ma and the current of injury was brisk.   . The leads were affixed to the prepectoral fascia and attached to a  Medtronic MRI compatible  pulse generator serial number [id number].  Hemostasis was obtained. The pocket was copiously irrigated with antibiotic containing saline solution. The leads and the pulse generator were placed in the pocket and affixed to the prepectoral fascia. The wound was then closed in 2 layers in the normal fashion. The wound was washed dried and a benzoin Steri-Strip dressing was applied.  Needle  Count, sponge counts and instrument counts were correct at the end of the procedure .   The patient tolerated the procedure without apparent  complication.  [name].D.

## 2012-08-03 NOTE — Progress Notes (Addendum)
Inpatient Diabetes Program Recommendations  AACE/ADA: New Consensus Statement on Inpatient Glycemic Control (2013)  Target Ranges:  Prepandial:   less than 140 mg/dL      Peak postprandial:   less than 180 mg/dL (1-2 hours)      Critically ill patients:  140 - 180 mg/dL   Reason for Visit: Results for [name], [name] (MRN [id number]) as of 08/03/2012 14:06  Ref. Range 08/02/2012 21:22 08/03/2012 04:28 08/03/2012 05:17 08/03/2012 08:12 08/03/2012 11:36  Glucose-Capillary Latest Range: 70-99 mg/dL 409 (H)  811 (H) 914 (H) 195 (H)   Note CBG's greater than goal.  According to Medication reconciliation, patient was taking Lantus 45 units q HS and Novolog 15 units with meals.  It appears that patient received Novolog 15 units this morning with 8am and 12 noon CBG.   Consider decreasing Novolog meal coverage to 10 units tid with meals and only give if patient eats greater than 50%.  Also please start Novolog moderate correction tid with meals.       Addendum:  Called and spoke to Dr. Donnie Aho.  Orders received. Will follow.

## 2012-08-03 NOTE — Progress Notes (Signed)
Subjective:  Feeling better this am and not SOB or chest pain.  No more heart block.  Plan pacer today and hopefully home in am.   Objective:  Vital Signs in the last 24 hours: BP 159/69  Pulse 84  Temp(Src) 98.2 F (36.8 C) (Oral)  Resp 19  Ht 5\' 11"  (1.803 m)  Wt 114.2 kg (251 lb 12.3 oz)  BMI 35.13 kg/m2  SpO2 96%  Physical Exam: Very obese white male in NAD Lungs:  clear Cardiac:  Distant heart sounds Regular rhythm, normal S1 and S2, no S3 Abdomen:  Soft, nontender, no masses Extremities:  No edema present  Intake/Output from previous day: 06/16 0701 - 06/17 0700 In: 668.4 [P.O.:320; I.V.:348.4] Out: 1500 [Urine:1500] Weight Filed Weights   08/01/12 2055 08/02/12 0200 08/03/12 0500  Weight: 113.399 kg (250 lb) 114.9 kg (253 lb 4.9 oz) 114.2 kg (251 lb 12.3 oz)    Lab Results: Basic Metabolic Panel:  Recent Labs  16/10/96 0301 08/03/12 0428  NA 141 139  K 5.0 5.4*  CL 105 103  CO2 23 24  GLUCOSE 94 298*  BUN 39* 38*  CREATININE 2.25* 2.00*    CBC:  Recent Labs  08/02/12 0301 08/02/12 1115  WBC 16.5* 13.2*  NEUTROABS 13.0* 10.2*  HGB 13.1 12.4*  HCT 37.4* 36.4*  MCV 86.6 87.1  PLT PLATELET CLUMPS NOTED ON SMEAR, COUNT APPEARS ADEQUATE 190    BNP    Component Value Date/Time   PROBNP 1959.0* 08/03/2012 0428    PROTIME: Lab Results  Component Value Date   INR 1.01 08/02/2012   INR 1.08 08/01/2012    Telemetry: Sinus rhythm with left bundle branch block pattern  ECHO  EF 50-55%, significant LVH  Assessment/Plan:  1. Transient complete heart block on presentation to the emergency room that has resolved 2. Left bundle branch block chronic 3. Diabetes mellitus with complications 4. Severe obesity 5. Hypertensive heart disease  Recommendations:  For pacer today.         Darden Palmer  MD [facility] Cardiology  08/03/2012, 8:41 AM

## 2012-08-04 ENCOUNTER — Inpatient Hospital Stay (HOSPITAL_COMMUNITY): Payer: Medicare Other

## 2012-08-04 LAB — GLUCOSE, CAPILLARY
Glucose-Capillary: 317 mg/dL — ABNORMAL HIGH (ref 70–99)
Glucose-Capillary: 149 mg/dL — ABNORMAL HIGH (ref 70–99)

## 2012-08-04 MED ORDER — AMLODIPINE BESYLATE 5 MG PO TABS
5.0000 mg | ORAL_TABLET | Freq: Every day | ORAL | Status: DC
  Administered 2012-08-04 – 2012-08-05 (×2): 5 mg via ORAL
  Filled 2012-08-04 (×2): qty 1

## 2012-08-04 MED ORDER — METOPROLOL TARTRATE 12.5 MG HALF TABLET
12.5000 mg | ORAL_TABLET | Freq: Two times a day (BID) | ORAL | Status: DC
  Administered 2012-08-04 – 2012-08-05 (×3): 12.5 mg via ORAL
  Filled 2012-08-04 (×4): qty 1

## 2012-08-04 NOTE — Care Management Note (Signed)
    Page 1 of 2   08/04/2012     3:04:07 PM   CARE MANAGEMENT NOTE 08/04/2012  Patient:  [name], [name]   Account Number:  [id number]  Date Initiated:  08/02/2012  Documentation initiated by:  Avie Arenas  Subjective/Objective Assessment:   Admitted with SOB - found to be in brady - dopamine and ext pacer placed.     Action/Plan:   Anticipated DC Date:  08/05/2012   Anticipated DC Plan:  HOME W HOME HEALTH SERVICES      DC Planning Services  CM consult      [facility] Choice  HOME HEALTH   Choice offered to / List presented to:  C-1 Patient        HH arranged  HH-2 PT      [facility] agency  [facility]   Status of service:  Completed, signed off Medicare Important Message given?   (If response is "NO", the following Medicare IM given date fields will be blank) Date Medicare IM given:   Date Additional Medicare IM given:    Discharge Disposition:  HOME W HOME HEALTH SERVICES  Per UR Regulation:  Reviewed for med. necessity/level of care/duration of stay  If discussed at Long Length of Stay Meetings, dates discussed:    Comments:  ContactEverardo All Daughter [phone]                 [facility] Spouse [phone]  08-04-12 [address], RN, BSN [phone] CM did speak to pt and family and pt uses a RW rollator at home with ambulation. Pt has DME: shower chair, 3n1 and a lift chair. Pt is form home with wife and she helps him with medication management and daily weights. Pt/family agreeable to HHPT services with [facility]. CM will need an order for HHPT services. CM did make referral for [facility] services with [facility] and [facility] to begin within 24-48 hours post d/c.

## 2012-08-04 NOTE — Progress Notes (Signed)
As above Will plan wound check in [place] per family request They also mentioned taht they would prefer to have PM followed here than at [facility]; will defer that to Dr Billie Lade

## 2012-08-04 NOTE — Progress Notes (Signed)
Physical Therapy Evaluation Patient Details Name: [name] MRN: [id number] DOB: [birth year] Today's Date: 08/04/2012 Time: 4098-1191 PT Time Calculation (min): 30 min  PT Assessment / Plan / Recommendation Clinical Impression  Pt is 67 yo male s/p pacer placement that is experiencing generalized weakness from recent decrease in mobility and multiple comorbidities. He is also at increased fall risk due to weakness and poor balance from peripheral neuropathy. Pt is at min/ mod assistance level. Recommend acute PT to begin to address these issues as well as HHPT at d/c.    PT Assessment  Patient needs continued PT services    Follow Up Recommendations  Home health PT;Supervision/Assistance - 24 hour    Does the patient have the potential to tolerate intense rehabilitation      Barriers to Discharge None      Equipment Recommendations  None recommended by PT    Recommendations for Other Services     Frequency Min 3X/week    Precautions / Restrictions Precautions Precautions: Fall Precaution Comments: pt falls occasionally at home, pacer precautions reviewed including minimizing wt on RW and no OH activity Restrictions Weight Bearing Restrictions: No   Pertinent Vitals/Pain VSS      Mobility  Bed Mobility Bed Mobility: Not assessed (sitting in chair) Details for Bed Mobility Assistance: discussed bed mobility at home b/c pt sleeps on left side and bed rail is on left and he should not lay on left right now due to pacer, bed only a double though so recommend rolling over to right and getting out on right side Transfers Transfers: Sit to Stand;Stand to Sit Sit to Stand: 4: Min assist;From chair/3-in-1;With upper extremity assist Stand to Sit: 4: Min assist;To chair/3-in-1;With upper extremity assist Details for Transfer Assistance: vc's for hand placement, min A to steady but pt did not require lifting for sit to stand Ambulation/Gait Ambulation/Gait Assistance: 3:  Mod assist Ambulation Distance (Feet): 20 Feet Assistive device: Rolling walker Ambulation/Gait Assistance Details: mod A for balance and keeping pt up in RW as well as keeping RW 4 points on floor. Pt tends to pick up RW esp with turns. Ambulation typical for peripheral neuro, increased foot slap, flat foot strike, wide BOS with hips in ext rotation Gait Pattern: Step-through pattern;Right genu recurvatum;Left genu recurvatum;Wide base of support Gait velocity: decreased Stairs: No Wheelchair Mobility Wheelchair Mobility: No    Exercises     PT Diagnosis: Difficulty walking;Abnormality of gait;Generalized weakness  PT Problem List: Decreased strength;Decreased activity tolerance;Decreased balance;Decreased mobility;Decreased coordination;Decreased cognition;Decreased knowledge of precautions PT Treatment Interventions: DME instruction;Gait training;Functional mobility training;Therapeutic exercise;Therapeutic activities;Balance training;Patient/family education   PT Goals Acute Rehab PT Goals PT Goal Formulation: With patient Time For Goal Achievement: 08/18/12 Potential to Achieve Goals: Good Pt will go Supine/Side to Sit: with min assist PT Goal: Supine/Side to Sit - Progress: Goal set today Pt will go Sit to Supine/Side: with min assist PT Goal: Sit to Supine/Side - Progress: Goal set today Pt will go Sit to Stand: with supervision PT Goal: Sit to Stand - Progress: Goal set today Pt will go Stand to Sit: with supervision PT Goal: Stand to Sit - Progress: Goal set today Pt will Transfer Bed to Chair/Chair to Bed: with supervision PT Transfer Goal: Bed to Chair/Chair to Bed - Progress: Goal set today Pt will Ambulate: 16 - 50 feet;with rolling walker;with min assist (min-guard) PT Goal: Ambulate - Progress: Goal set today  Visit Information  Last PT Received On: 08/04/12 Assistance Needed: +2 (for  ambulation)    Subjective Data  Subjective: pt's family feels strongly that pt  can go home with [facility] Patient Stated Goal: return home   Prior Functioning  Home Living Lives With: Spouse Available Help at Discharge: Family;Available 24 hours/day Type of Home: House Home Access: Ramped entrance Home Layout: One level Home Adaptive Equipment: Walker - four wheeled (bed rail) Additional Comments: family has equipment needed. 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 Dominant Hand: Right    Cognition  Cognition Arousal/Alertness: Awake/alert Behavior During Therapy: WFL for tasks assessed/performed Overall Cognitive Status: History of cognitive impairments - at baseline    Extremity/Trunk Assessment Right Upper Extremity Assessment RUE ROM/Strength/Tone: Deficits RUE ROM/Strength/Tone Deficits: shoulder limitations at baseline RUE Sensation: History of peripheral neuropathy Left Upper Extremity Assessment LUE ROM/Strength/Tone: Deficits LUE ROM/Strength/Tone Deficits: NT due to pacer placement but shoulder limitations at baseline LUE Sensation: History of peripheral neuropathy Right Lower Extremity Assessment RLE ROM/Strength/Tone: Deficits RLE ROM/Strength/Tone Deficits: generalized weakness noted, grossly 4-/5 throughout RLE Sensation: History of peripheral neuropathy RLE Coordination: Deficits RLE Coordination Deficits: poor coordination due to neuropathy Left Lower Extremity Assessment LLE ROM/Strength/Tone: Deficits LLE ROM/Strength/Tone Deficits: slightly weaker than right (dtr reports that this is normal for him), grossly 3+/5  throughout LLE Sensation: History of peripheral neuropathy LLE Coordination: Deficits LLE Coordination Deficits: due to peripheral neuro Trunk Assessment Trunk Assessment: Kyphotic   Balance Balance Balance Assessed: Yes Dynamic Standing Balance Dynamic Standing - Balance Support: Bilateral upper extremity supported;During functional activity Dynamic Standing - Level of Assistance: 3: Mod assist  End of Session PT - End of Session Equipment Utilized During Treatment: Gait belt Activity Tolerance: Patient tolerated treatment well Patient left: in chair;with call bell/phone within reach;with family/visitor present Nurse Communication: Mobility status  GP   Lyanne Co, PT  Acute Rehab Services  [phone]   Lyanne Co 08/04/2012, 4:26 PM

## 2012-08-04 NOTE — Progress Notes (Signed)
Patient: [name] Date of Encounter: 08/04/2012, 8:54 AM Admit date: 08/01/2012     Subjective  [name] denies any complaints this AM. He denies CP or SOB.   Objective  Physical Exam: Vitals: BP 177/51  Pulse 77  Temp(Src) 98.5 F (36.9 C) (Oral)  Resp 24  Ht 5\' 11"  (1.803 m)  Wt 254 lb 3.1 oz (115.3 kg)  BMI 35.47 kg/m2  SpO2 95% General: Well developed, overweight 67 year old male in no acute distress. Neck: Supple. JVD not elevated. Lungs: Clear bilaterally to auscultation without wheezes, rales, or rhonchi. Breathing is unlabored. Heart: RRR S1 S2 without murmurs, rubs, or gallops.  Abdomen: Large but soft, non-distended. Extremities: No clubbing or cyanosis. No edema.  Distal pedal pulses are 2+ and equal bilaterally. Neuro: Alert and oriented X 3. Moves all extremities spontaneously. No focal deficits. Skin: Left upper chest/implant site intact without significant bleeding or hematoma.  Intake/Output:  Intake/Output Summary (Last 24 hours) at 08/04/12 0854 Last data filed at 08/04/12 0600  Gross per 24 hour  Intake    778 ml  Output   1250 ml  Net   -472 ml    Inpatient Medications:  . artificial tears   Both Eyes QHS  . aspirin EC  81 mg Oral Daily  . atorvastatin  40 mg Oral QHS  . Chlorhexidine Gluconate Cloth  6 each Topical Q0600  . cholecalciferol  2,000 Units Oral Daily  . ferrous sulfate  325 mg Oral BID WC  . folic acid  1 mg Oral Daily  . heparin  5,000 Units Subcutaneous Q8H  . insulin aspart  0-15 Units Subcutaneous TID WC  . insulin aspart  0-5 Units Subcutaneous QHS  . insulin aspart  10 Units Subcutaneous TID WC  . insulin glargine  30 Units Subcutaneous QHS  . Ipratropium-Albuterol  1 puff Inhalation QID  . levothyroxine  25 mcg Oral QAC breakfast  . mirtazapine  15 mg Oral QHS  . mupirocin ointment  1 application Nasal BID  . omega-3 acid ethyl esters  1 g Oral BID  . pantoprazole  40 mg Oral Daily  . pentoxifylline   400 mg Oral TID PC  . sodium chloride  3 mL Intravenous Q12H  . thiamine  100 mg Oral Daily   . DOPamine Stopped (08/03/12 0600)    Labs:  Recent Labs  08/01/12 2100 08/02/12 0301 08/03/12 0428  NA 139 141 139  K 4.0 5.0 5.4*  CL 102 105 103  CO2 23 23 24   GLUCOSE 208* 94 298*  BUN 38* 39* 38*  CREATININE 2.03* 2.25* 2.00*  CALCIUM 9.8 9.6 9.4  MG  --  1.6  --     Recent Labs  08/01/12 2100 08/02/12 0301  AST 15 15  ALT 15 14  ALKPHOS 110 98  BILITOT 0.2* 0.2*  PROT 7.7 7.6  ALBUMIN 3.5 3.4*    Recent Labs  08/02/12 0301 08/02/12 1115  WBC 16.5* 13.2*  NEUTROABS 13.0* 10.2*  HGB 13.1 12.4*  HCT 37.4* 36.4*  MCV 86.6 87.1  PLT PLATELET CLUMPS NOTED ON SMEAR, COUNT APPEARS ADEQUATE 190    Recent Labs  08/01/12 2100 08/02/12 0301 08/02/12 0815  TROPONINI <0.30 <0.30 <0.30    Recent Labs  08/02/12 0301  TSH 1.735    Recent Labs  08/02/12 0301  INR 1.01    Radiology/Studies: Dg Chest 2 View  08/04/2012   *RADIOLOGY REPORT*  Clinical Data: Shortness of  breath.  New transvenous pacemaker insertion.  CHEST - 2 VIEW  Comparison: 08/01/2012 and 05/31/2011  Findings: Heart size and pulmonary vascularity are stable.  Minimal scarring at the left lung base.  Pacer appears in good position. No pneumothorax.  IMPRESSION: No acute abnormalities.   Original Report Authenticated By: Francene Boyers, M.D.    Telemetry: SR Device interrogation: performed by industry shows normal PPM function with stable lead measurements    Assessment and Plan  1. Transient CHB s/p PPM implant yesterday 2. Normal LVEF 3. LBBB 4. Renal failure and hyperkalemia - repeat BMET pending; per primary MD  [name] is doing well post PPM implant. His CXR shows stable lead placement without PTX. His wound is intact without bleeding or hematoma. His device interrogation shows normal PPM function. Reviewed wound care, activity restrictions and follow-up with patient.  Dr. Graciela Husbands  to see Signed, Rick Duff PA-C

## 2012-08-04 NOTE — Progress Notes (Signed)
Subjective:  Feeling better this am and not SOB or chest pain.  Had pacer inserted yesterday and had transient atrial fibrillation that resolved ? Irritation from pacer?  He was significantly limited at home.  Objective:  Vital Signs in the last 24 hours: BP 177/51  Pulse 77  Temp(Src) 98.5 F (36.9 C) (Oral)  Resp 24  Ht 5\' 11"  (1.803 m)  Wt 115.3 kg (254 lb 3.1 oz)  BMI 35.47 kg/m2  SpO2 95%  Physical Exam: Very obese white male in NAD Lungs:  clear Cardiac:  Distant heart sounds Regular rhythm, normal S1 and S2, no S3, pacer site clean and dry Abdomen:  Soft, nontender, no masses Extremities:  No edema present  Intake/Output from previous day: 06/17 0701 - 06/18 0700 In: 828 [P.O.:200; I.V.:528; IV Piggyback:100] Out: 1250 [Urine:1250] Weight Filed Weights   08/02/12 0200 08/03/12 0500 08/04/12 0500  Weight: 114.9 kg (253 lb 4.9 oz) 114.2 kg (251 lb 12.3 oz) 115.3 kg (254 lb 3.1 oz)    Lab Results: Basic Metabolic Panel:  Recent Labs  14/78/29 0301 08/03/12 0428  NA 141 139  K 5.0 5.4*  CL 105 103  CO2 23 24  GLUCOSE 94 298*  BUN 39* 38*  CREATININE 2.25* 2.00*    CBC:  Recent Labs  08/02/12 0301 08/02/12 1115  WBC 16.5* 13.2*  NEUTROABS 13.0* 10.2*  HGB 13.1 12.4*  HCT 37.4* 36.4*  MCV 86.6 87.1  PLT PLATELET CLUMPS NOTED ON SMEAR, COUNT APPEARS ADEQUATE 190    BNP    Component Value Date/Time   PROBNP 1959.0* 08/03/2012 0428    PROTIME: Lab Results  Component Value Date   INR 1.01 08/02/2012   INR 1.08 08/01/2012    Telemetry: Sinus rhythm with left bundle branch block pattern  ECHO  EF 50-55%, significant LVH  Assessment/Plan:  1. Transient complete heart block on presentation to the emergency room that has resolved 2. Left bundle branch block chronic 3. Diabetes mellitus with complications 4. Severe obesity 5. Hypertensive heart disease 6. Pacer functioning well  Recommendations:  Move to floor and ask care management to  see. Hopefully home in am.       W. Ashley Royalty  MD [facility]  08/04/2012, 9:25 AM

## 2012-08-04 NOTE — Progress Notes (Signed)
Transferred to 9F62 by wheelchair, stable. Belongings with spouse, report given to RN.

## 2012-08-05 ENCOUNTER — Encounter (HOSPITAL_COMMUNITY): Payer: Self-pay | Admitting: Cardiology

## 2012-08-05 DIAGNOSIS — I5033 Acute on chronic diastolic (congestive) heart failure: Secondary | ICD-10-CM | POA: Insufficient documentation

## 2012-08-05 DIAGNOSIS — E039 Hypothyroidism, unspecified: Secondary | ICD-10-CM | POA: Insufficient documentation

## 2012-08-05 DIAGNOSIS — E785 Hyperlipidemia, unspecified: Secondary | ICD-10-CM | POA: Insufficient documentation

## 2012-08-05 LAB — GLUCOSE, CAPILLARY: Glucose-Capillary: 184 mg/dL — ABNORMAL HIGH (ref 70–99)

## 2012-08-05 MED ORDER — AMLODIPINE BESYLATE 5 MG PO TABS: 5.0000 mg | ORAL_TABLET | Freq: Every day | ORAL | Status: DC

## 2012-08-05 NOTE — Discharge Summary (Addendum)
Physician Discharge Summary  Patient ID: [name] MRN: [id number] DOB/AGE: 07-20-1945 67 y.o.  Admit date: 08/01/2012 Discharge date: 08/05/2012  Primary Physician:  [facility]  Primary Discharge Diagnosis: 1. Transient complete heart block with cardiac arrest  Secondary Discharge Diagnosis: 2. Hypertensive heart disease 3. Acute on chronic diastolic congestive heart failure 4. Chronic left bundle branch block 5. 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 Chronic kidney disease stage 3-4 7. Obesity 8. Cardiac pacemaker in situ 9. Hypothyroidism under treatment  Procedures:  Insertion of permanent pacemaker by Dr. Graciela Husbands on 6/17  Consults:  Dr. Ivory Broad Course: This 67 year old male has a history of diabetes mellitus with significant peripheral neuropathy. He is quite limited at home and walks with a walker and goes about in a motorized scooter. He also has chronic diastolic dysfunction and is followed at the [facility] in [place].   He developed sudden severe dyspnea with generalized weakness that began several hours prior to admission. He had diaphoresis and had an elevated blood sugar. He was noted to be vomiting on arrival from EMS and he was found to be in complete heart block with a rate of 20. He was given atropine and a dopamine drip and transcutaneous pacemaking was initiated. He received an intraosseous infusion of dopamine and was seen at [facility] and transferred to [facility] for further evaluation. On arrival here he was on dopamine but needed no further transcutaneous pacemaking and was alert and oriented.  The patient had significant nausea and vomiting when he initially came that resolved. He was seen by Dr. Sherryl Manges and it was felt that he would need to have a permanent transvenous pacemaker. Echocardiogram was done and showedmoderate concentric LVH with an  ejection fraction of 50-55%. The aorta was mildly dilatedand the left atrium was moderately dilated.he was taken to the catheterization laboratory had a permanent pacemaker implanted on  6/17 by Dr. Sherryl Manges. Device and leads were LV lead Medtronic 5076 serial number [id number] ,  LA lead Medtronic 5076 serial number [id number], Medtronic MRI compatible pulse generator serial number [id number].  He was feeling better following pacemaker insertion and  the pacemaker was working well.  He had some transient atrial fibrillation the night following pacemaker insertion the resolved and was thought to be due to this.  He was able to be transferred to the floor and was seen by physical therapy. He had amlodipine added because of continued elevation of blood pressure. He is discharged home in improved condition and will receive  home health physical therapy. He will continue outpatient followup at the [place]. He was given instructions regarding care of the pacemaker site. He was given diet instructions to follow a calorie and carbohydrate restricted diet to try to lose down to an ideal body weight.  Discharge Exam: Blood pressure 157/68, pulse 76, temperature 98.5 F (36.9 C), temperature source Oral, resp. rate 20, height 5\' 11"  (1.803 m), weight 111.222 kg (245 lb 3.2 oz), SpO2 96.00%.   Lungs clear, normal S1-S2, no S3. Pacemaker site is clean and dry. Steri-Strips noted.  Labs: CBC:   Lab Results  Component Value Date   WBC 13.2* 08/02/2012   HGB 12.4* 08/02/2012   HCT 36.4* 08/02/2012   MCV 87.1 08/02/2012   PLT 190 08/02/2012   CMP:  Recent Labs Lab 08/02/12 0301 08/03/12 0428  NA 141 139  K 5.0 5.4*  CL 105 103  CO2 23 24  BUN 39* 38*  CREATININE 2.25* 2.00*  CALCIUM 9.6 9.4  PROT 7.6  --   BILITOT 0.2*  --   ALKPHOS 98  --   ALT 14  --   AST 15  --   GLUCOSE 94 298*   Lipid Panel     Component Value Date/Time   CHOL 135 08/02/2012 0301   TRIG 139 08/02/2012 0301   HDL 27*  08/02/2012 0301   CHOLHDL 5.0 08/02/2012 0301   VLDL 28 08/02/2012 0301   LDLCALC 80 08/02/2012 0301   Lab Results  Component Value Date   TSH 1.735 08/02/2012   Lab Results  Component Value Date   HGBA1C 7.1* 08/03/2012   BNP    Component Value Date/Time   PROBNP 1959.0* 08/03/2012 0428    Radiology: Cardiomegaly, minimal scarring at left base, no congestive heart failure  EKG: Sinus rhythm with left bundle branch block  Discharge Medications:   Medication List    TAKE these medications       albuterol-ipratropium 18-103 MCG/ACT inhaler  Commonly known as:  COMBIVENT  Inhale 1 puff into the lungs 4 (four) times daily.     amLODipine 5 MG tablet  Commonly known as:  NORVASC  Take 1 tablet (5 mg total) by mouth daily.     artificial tears Oint ophthalmic ointment  Place 1 application into both eyes every evening.     aspirin EC 81 MG tablet  Take 81 mg by mouth daily.     atorvastatin 80 MG tablet  Commonly known as:  LIPITOR  Take 40 mg by mouth at bedtime.     ferrous sulfate 325 (65 FE) MG tablet  Take 325 mg by mouth 2 (two) times daily.     fish oil-omega-3 fatty acids 1000 MG capsule  Take 1 g by mouth 2 (two) times daily.     folic acid 1 MG tablet  Commonly known as:  FOLVITE  Take 1 mg by mouth daily.     insulin aspart 100 UNIT/ML injection  Commonly known as:  novoLOG  Inject 20 Units into the skin 3 (three) times daily before meals.     insulin glargine 100 UNIT/ML injection  Commonly known as:  LANTUS  Inject 45 Units into the skin at bedtime.     ketotifen 0.025 % ophthalmic solution  Commonly known as:  ZADITOR  Place 1 drop into both eyes 2 (two) times daily as needed.     levothyroxine 25 MCG tablet  Commonly known as:  SYNTHROID, LEVOTHROID  Take 25 mcg by mouth every morning.     metoprolol tartrate 25 MG tablet  Commonly known as:  LOPRESSOR  Take 12.5 mg by mouth 2 (two) times daily.     mirtazapine 15 MG tablet  Commonly  known as:  REMERON  Take 15 mg by mouth at bedtime.     omeprazole 20 MG capsule  Commonly known as:  PRILOSEC  Take 20 mg by mouth daily.     pentoxifylline 400 MG CR tablet  Commonly known as:  TRENTAL  Take 400 mg by mouth 3 (three) times daily after meals.     sertraline 100 MG tablet  Commonly known as:  ZOLOFT  Take 100 mg by mouth daily.     thiamine 100 MG tablet  Commonly known as:  VITAMIN B-1  Take 100 mg by mouth daily.     Vitamin D 2000 UNITS Caps  Take 2,000 Units by mouth daily.  Followup plans and appointments: Followup with Dr. Donnie Aho in one week.he is to have a pacemaker check on June 30 of hour. Pacemaker instructions were given to patient. He will have medical followup at the [facility].    Time spent with patient to include physician time:  45 minutes   Signed: W. Ashley Royalty. MD [facility] 08/05/2012, 10:05 AM

## 2012-08-09 LAB — GLUCOSE, CAPILLARY: Glucose-Capillary: 201 mg/dL — ABNORMAL HIGH (ref 70–99)

## 2012-08-16 ENCOUNTER — Ambulatory Visit (INDEPENDENT_AMBULATORY_CARE_PROVIDER_SITE_OTHER): Payer: Medicare Other | Admitting: *Deleted

## 2012-08-16 DIAGNOSIS — I495 Sick sinus syndrome: Secondary | ICD-10-CM

## 2012-08-16 NOTE — Progress Notes (Signed)
Pt seen in device clinic for follow up of recently implanted pacemaker.  Wound well healed.  No redness, swelling, or edema.  Steri-strips removed today.   Device interrogated and found to be functioning normally.  No changes made today. See PaceArt for full details.  Pt denies chest pain, shortness of breath, palpitations, or dizziness.  Pt to follow up with Dr. Graciela Husbands 11/09/12 @ 11:45am for 3 mo implant follow up.   Weston Anna 08/16/2012 5:39 PM

## 2012-08-17 LAB — PACEMAKER DEVICE OBSERVATION
AL IMPEDENCE PM: 437 Ohm
ATRIAL PACING PM: 0.7
BAMS-0001: 170 {beats}/min
BATTERY VOLTAGE: 3.1232 V
RV LEAD AMPLITUDE: 22.75 mv
VENTRICULAR PACING PM: 0.05

## 2012-09-01 ENCOUNTER — Encounter: Payer: Self-pay | Admitting: Internal Medicine

## 2012-09-22 ENCOUNTER — Other Ambulatory Visit: Payer: Self-pay

## 2012-11-04 ENCOUNTER — Encounter: Payer: Self-pay | Admitting: *Deleted

## 2012-11-09 ENCOUNTER — Encounter: Payer: Self-pay | Admitting: Internal Medicine

## 2012-11-09 ENCOUNTER — Ambulatory Visit (INDEPENDENT_AMBULATORY_CARE_PROVIDER_SITE_OTHER): Payer: Medicare Other | Admitting: Internal Medicine

## 2012-11-09 VITALS — BP 150/68 | HR 72 | Wt 257.0 lb

## 2012-11-09 DIAGNOSIS — I447 Left bundle-branch block, unspecified: Secondary | ICD-10-CM

## 2012-11-09 DIAGNOSIS — I442 Atrioventricular block, complete: Secondary | ICD-10-CM

## 2012-11-09 DIAGNOSIS — I5032 Chronic diastolic (congestive) heart failure: Secondary | ICD-10-CM

## 2012-11-09 DIAGNOSIS — I4819 Other persistent atrial fibrillation: Secondary | ICD-10-CM | POA: Insufficient documentation

## 2012-11-09 DIAGNOSIS — I4891 Unspecified atrial fibrillation: Secondary | ICD-10-CM

## 2012-11-09 HISTORY — DX: Atrioventricular block, complete: I44.2

## 2012-11-09 LAB — PACEMAKER DEVICE OBSERVATION
AL IMPEDENCE PM: 456 Ohm
AL THRESHOLD: 0.75 V
ATRIAL PACING PM: 6.39
RV LEAD AMPLITUDE: 20 mv
RV LEAD IMPEDENCE PM: 570 Ohm
RV LEAD THRESHOLD: 0.75 V

## 2012-11-09 NOTE — Assessment & Plan Note (Signed)
Stable post pacing 

## 2012-11-09 NOTE — Progress Notes (Signed)
Patient has no care team.   HPI  [name] is a 67 y.o. male Seen following pacemaker implantation 6/14 for left bundle branch block with intermittent complete heart block  No recurreent syncope  Echo ejection fraction 6/14 demonstrated 50-55% with mild aortic and moderate left atrial dilatation  Past Medical History  Diagnosis Date  . Chronic kidney disease (CKD), stage III (moderate) 08/02/2012  . LBBB (left bundle branch block) 08/02/2012  . DM (diabetes mellitus) with complications 08/02/2012  . Hypertensive heart disease   . Cardiac pacemaker in situ 08/03/2012    Indications transient complete heart block Insertion 08/03/12 Dr. Graciela Husbands  LV lead Medtronic 5076 serial number [id number]  LA lead Medtronic 5076 serial number [id number] Medtronic MRI compatible pulse generator serial number [id number].       . Obesity (BMI 30-39.9)   . Chronic diastolic heart failure   . Hypothyroidism   . Hyperlipidemia     No past surgical history on file.  Current Outpatient Prescriptions  Medication Sig Dispense Refill  . albuterol-ipratropium (COMBIVENT) 18-103 MCG/ACT inhaler Inhale 1 puff into the lungs 4 (four) times daily.      Marland Kitchen amLODipine (NORVASC) 5 MG tablet Take 1 tablet (5 mg total) by mouth daily.  30 tablet  12  . aspirin EC 81 MG tablet Take 81 mg by mouth daily.      Marland Kitchen atorvastatin (LIPITOR) 80 MG tablet Take 40 mg by mouth at bedtime.      . Cholecalciferol (VITAMIN D) 2000 UNITS CAPS Take 2,000 Units by mouth daily.      . ferrous sulfate 325 (65 FE) MG tablet Take 325 mg by mouth 2 (two) times daily.      . fish oil-omega-3 fatty acids 1000 MG capsule Take 1 g by mouth 2 (two) times daily.      . folic acid (FOLVITE) 1 MG tablet Take 1 mg by mouth daily.      . insulin aspart (NOVOLOG) 100 UNIT/ML injection Inject 20 Units into the skin 3 (three) times daily before meals.      . insulin glargine (LANTUS) 100 UNIT/ML injection Inject 45 Units into the skin at bedtime.       Marland Kitchen ketotifen (ZADITOR) 0.025 % ophthalmic solution Place 1 drop into both eyes 2 (two) times daily as needed.      Marland Kitchen levothyroxine (SYNTHROID, LEVOTHROID) 25 MCG tablet Take 25 mcg by mouth every morning.      . metoprolol tartrate (LOPRESSOR) 25 MG tablet Take 12.5 mg by mouth 2 (two) times daily.      . mirtazapine (REMERON) 15 MG tablet Take 15 mg by mouth at bedtime.      Marland Kitchen omeprazole (PRILOSEC) 20 MG capsule Take 20 mg by mouth daily.      . pentoxifylline (TRENTAL) 400 MG CR tablet Take 400 mg by mouth 3 (three) times daily after meals.      . sertraline (ZOLOFT) 100 MG tablet Take 100 mg by mouth daily.      Marland Kitchen thiamine (VITAMIN B-1) 100 MG tablet Take 100 mg by mouth daily.      Cliffton Asters Petrolatum-Mineral Oil (ARTIFICIAL TEARS) OINT ophthalmic ointment Place 1 application into both eyes every evening.       No current facility-administered medications for this visit.    Allergies  Allergen Reactions  . Lasix [Furosemide]     Review of Systems negative except from HPI and PMH  Physical Exam BP 150/68  Pulse 72  Wt 257 lb (116.574 kg)  BMI 35.86 kg/m2 Well developed and well nourished in no acute distress HENT normal E scleral and icterus clear Neck Supple JVP flat; carotids brisk and full Clear to ausculation  Device pocket well healed; without hematoma or erythema.  There is no tethering Regular rate and rhythm, no murmurs gallops or rub Soft with active bowel sounds No clubbing cyanosis none Edema Alert and oriented,  Poor balance in wheel chair Skin Warm and Dry  ECG demonstrates sinus rhythm at 72 Intervals 15/16/46  LBBB  Assessment and  Plan

## 2012-11-09 NOTE — Assessment & Plan Note (Signed)
Euvolemic. I wonder whether he doesn't have a component of obstructive sleep apnea. He has serious daytime somnolence. Recommend consideration for sleep study

## 2012-11-09 NOTE — Patient Instructions (Addendum)
Your physician wants you to follow-up in: 9 months with Dr. Graciela Husbands. You will receive a reminder letter in the mail two months in advance. If you don't receive a letter, please call our office to schedule the follow-up appointment.  Your physician recommends that you continue on your current medications as directed. Please refer to the Current Medication list given to you today.

## 2012-11-09 NOTE — Assessment & Plan Note (Signed)
Stable post pacing With 4% Vpacing

## 2012-11-09 NOTE — Assessment & Plan Note (Signed)
Intermittent episodes of atrial fibrillation of up to 6 hours duration. This is confirmed by electrograms.  Thromboembolic risk profile is notable for hypertension, Diabetes, age, for a CHADS-VASc score of 3. He is currently on aspirin. Would recommend consideration of anticoagulation. Will defer to Dr. Donnie Aho

## 2012-12-23 ENCOUNTER — Other Ambulatory Visit: Payer: Self-pay

## 2013-02-15 ENCOUNTER — Other Ambulatory Visit (HOSPITAL_COMMUNITY): Payer: Self-pay | Admitting: Cardiology

## 2013-02-15 ENCOUNTER — Ambulatory Visit (HOSPITAL_COMMUNITY)
Admission: RE | Admit: 2013-02-15 | Discharge: 2013-02-15 | Disposition: A | Discharge status: Home / Self Care | Payer: Medicare Other | Source: Ambulatory Visit | Attending: Vascular Surgery | Admitting: Vascular Surgery

## 2013-02-15 DIAGNOSIS — M7989 Other specified soft tissue disorders: Secondary | ICD-10-CM

## 2013-08-10 ENCOUNTER — Encounter: Payer: Self-pay | Admitting: *Deleted

## 2013-08-17 ENCOUNTER — Telehealth: Payer: Self-pay | Admitting: Internal Medicine

## 2013-08-17 NOTE — Telephone Encounter (Signed)
Noted in PaceArt. 

## 2013-08-17 NOTE — Telephone Encounter (Signed)
New message    Wife calling stating they receive a letter from the office.     Wife stated patient getting his device check at Dr. Donnie Ahoilley office.

## 2014-01-26 ENCOUNTER — Encounter (HOSPITAL_COMMUNITY): Payer: Self-pay | Admitting: Internal Medicine

## 2015-11-14 ENCOUNTER — Other Ambulatory Visit: Payer: Self-pay | Admitting: Cardiology

## 2015-11-14 ENCOUNTER — Ambulatory Visit
Admission: RE | Admit: 2015-11-14 | Discharge: 2015-11-14 | Disposition: A | Discharge status: Home / Self Care | Payer: Medicare HMO | Source: Ambulatory Visit | Attending: Cardiology | Admitting: Cardiology

## 2015-11-14 DIAGNOSIS — I480 Paroxysmal atrial fibrillation: Secondary | ICD-10-CM

## 2015-12-03 ENCOUNTER — Encounter: Payer: Self-pay | Admitting: Cardiology

## 2015-12-14 ENCOUNTER — Encounter: Payer: Self-pay | Admitting: Cardiology

## 2015-12-14 NOTE — Progress Notes (Signed)
[name]  Date of visit:  12/14/2015 DOB:  06-03-1945    Age:  70 yrs. Medical record number:  [id number]     Account number:  [id number] Primary Care Provider: Charlyne Petrin ____________________________ CURRENT DIAGNOSES  1. Hypertensive heart disease without heart failure  2. Paroxysmal atrial fibrillation  3. Chronic diastolic (congestive) heart failure  4. Chronic kidney disease, stage 4 (severe)  5. Long term (current) use of anticoagulants  6. Presence of cardiac pacemaker  7. Hypothyroidism  8. Hyperlipidemia  9. Sleep apnea  10. Type 2 diabetes mellitus with diabetic nephropathy ____________________________ ALLERGIES  Sulfa (Sulfonamide Antibiotics), Rash ____________________________ MEDICATIONS  1. Combivent Respimat 20-100 mcg/actuation aerosol, 1 puff qid  2. amlodipine 5 mg tablet, 1 p.o. daily  3. atorvastatin 80 mg tablet, 1/2 tab daily  4. ferrous sulfate 325 mg (65 mg iron) tablet, BID  5. Fish Oil 300-1,000 mg capsule, BID  6. folic acid 1 mg tablet, 1 p.o. daily  7. mirtazapine 15 mg tablet, QHS  8. omeprazole 20 mg capsule,delayed release(DR/EC), 1 p.o. daily  9. sertraline 100 mg tablet, 1 p.o. daily  10. thiamine HCl 100 mg tablet, 1 p.o. daily  11. Vitamin D3 2,000 unit tablet, 1 p.o. daily  12. albuterol sulfate HFA 90 mcg/actuation aerosol inhaler, PRN  13. Novolog 100 unit/mL subcutaneous solution, slinding scale  14. Lantus 100 unit/mL subcutaneous solution, 52u qhs  15. pentoxifylline ER 400 mg tablet,extended release, BID  16. metoprolol tartrate 50 mg tablet, BID  17. furosemide 40 mg tablet, BID  18. lactulose 20 gram/30 mL oral solution, weekly  19. warfarin 2.5 mg tablet, 1 qd or as directed  20. levothyroxine 25 mcg tablet, 1 p.o. daily M through F and 2 tablets Sat and Sun  21. amiodarone 200 mg tablet, 1 p.o. daily ____________________________ HISTORY OF PRESENT ILLNESS Patient seen for cardiac followup. He has chronic diastolic  congestive heart failure and has been having persistent atrial fibrillation now on his device. He has been more short of breath and has noted some edema despite increasing his purulence might. He does have chronic kidney disease stage IV. He was started on amiodarone 200 mg twice daily a month ago for the atrial fibrillation but is still in atrial fibrillation today. He is tired and sleeps a lot. He is obese and currently in a wheelchair. Some mild PND, mild edema. Weight gain of 7 pounds since last year. ____________________________ PAST HISTORY  Past Medical Illnesses:  DM-insulin dependent, hyperlipidemia, obesity, hypertension, hypothyroidism, sleep apnea, chronic kidney disease Stage 4;  Cardiovascular Illnesses:  LBBB, history of complete heart block, atrial fibrillation-paroxysmal;  Surgical Procedures:  cataract extraction OU;  NYHA Classification:  II;  Canadian Angina Classification:  Class 0: Asymptomatic;  Cardiology Procedures-Invasive:  pacemaker implant June 2014;  Cardiology Procedures-Noninvasive:  echocardiogram October 2017;  LVEF of 55% documented via echocardiogram on 12/03/2015,   CHA2DS2-VASC Score:  3 ____________________________ CARDIO-PULMONARY TEST DATES EKG Date:  04/17/2015;  Echocardiography Date: 12/03/2015;  Chest Xray Date: 08/01/2012;   ____________________________ FAMILY HISTORY Brother -- Brother alive and well Brother -- Brother dead, Alcoholism, Diabetes mellitus Father -- Father dead, Cancer Mother -- Mother dead, Malignant neoplasm of lung Sister -- Sister alive with problem, Pacemaker in situ ____________________________ SOCIAL HISTORY Alcohol Use:  no alcohol use;  Smoking:  used to smoke but quit 2011;  Diet:  regular diet;  Lifestyle:  married;  Exercise:  exercise is limited due to physical disability;  Occupation:  retired and Event organiser;  Residence:  lives with wife;   ____________________________ REVIEW OF SYSTEMS General:  obesity, malaise and  fatigue  Integumentary:no rashes or new skin lesions. Eyes: floaters Respiratory: mild dyspnea Cardiovascular:  please review HPI Abdominal: constipation Genitourinary-Male: nocturia, incontinence  Musculoskeletal:  weakness, back pain Neurological:  unsteady gait  ____________________________ PHYSICAL EXAMINATION VITAL SIGNS  Blood Pressure:  130/64 Sitting, Right arm, large cuff   Pulse:  68/min. Weight:  264.00 lbs. Height:  71"BMI: 37  Constitutional:  pleasant white male in no acute distress in wheelchair, moderately obese Skin:  warm and dry to touch, no apparent skin lesions, or masses noted. Head:  normocephalic, normal hair pattern, no masses or tenderness Eyes:  EOMS Intact, PERRLA, C and S clear, Funduscopic exam not done. ENT:  ears, nose and throat reveal no gross abnormalities.  Dentition good. Neck:  supple, without massess. No JVD,, right carotid bruit present Chest:  normal symmetry, clear to auscultation, healed pacemaker incision in the left pectoral area Cardiac:  irregular rhythm, normal S1 and S2, no S3 or S4 Peripheral Pulses:  dorsalis pedis, and posterior tibial pulses are full and equal bilaterally with no bruits auscultated. Extremities & Back:  no edema present, right foot in brace, normal muscle strength and tone. Neurological:  unsteady gait ____________________________ MOST RECENT LIPID PANEL 11/21/15  CHOL TOTL 97 mg/dl, LDL 33 NM, HDL 31 mg/dl, TRIGLYCER 163 mg/dl, ALT 16 u/l, ALK PHOS 139 u/l and AST 11 u/l ____________________________ IMPRESSIONS/PLAN  1. Persistent atrial fibrillation despite amiodarone loading. 2. Long-term use of anticoagulation currently controlled 3. Type 2 diabetes with nephropathy 4. Hypertensive heart disease  Recommendations:  His atrial fibrillation is persistent. As I will be gone out for knee replacement surgery in 4 days have given him a referral to the atrial fibrillation clinic. At this point I would like to consider  cardioversion as I think it might improve improve his diastolic function. He has moderate left atrial enlargement and has fairly marked LVH so I think maintenance of sinus rhythm may be helpful. I would appreciate Dr. Jackalyn Lombard evaluation.  ____________________________ TODAYS ORDERS  1. Return Visit: 2 months  2. Referral to atrial fib clinic                       ____________________________ Cardiology Physician:  Kerry Hough MD [facility]

## 2015-12-20 ENCOUNTER — Encounter (HOSPITAL_COMMUNITY): Payer: Self-pay | Admitting: Nurse Practitioner

## 2015-12-20 ENCOUNTER — Ambulatory Visit (HOSPITAL_COMMUNITY)
Admission: RE | Admit: 2015-12-20 | Discharge: 2015-12-20 | Disposition: A | Discharge status: Home / Self Care | Payer: Medicare HMO | Source: Ambulatory Visit | Attending: Nurse Practitioner | Admitting: Nurse Practitioner

## 2015-12-20 VITALS — BP 140/66 | HR 61 | Ht 71.0 in | Wt 265.2 lb

## 2015-12-20 DIAGNOSIS — E1122 Type 2 diabetes mellitus with diabetic chronic kidney disease: Secondary | ICD-10-CM | POA: Diagnosis not present

## 2015-12-20 DIAGNOSIS — E785 Hyperlipidemia, unspecified: Secondary | ICD-10-CM | POA: Diagnosis not present

## 2015-12-20 DIAGNOSIS — I5032 Chronic diastolic (congestive) heart failure: Secondary | ICD-10-CM | POA: Insufficient documentation

## 2015-12-20 DIAGNOSIS — I130 Hypertensive heart and chronic kidney disease with heart failure and stage 1 through stage 4 chronic kidney disease, or unspecified chronic kidney disease: Secondary | ICD-10-CM | POA: Diagnosis not present

## 2015-12-20 DIAGNOSIS — G4730 Sleep apnea, unspecified: Secondary | ICD-10-CM | POA: Diagnosis not present

## 2015-12-20 DIAGNOSIS — E669 Obesity, unspecified: Secondary | ICD-10-CM | POA: Diagnosis not present

## 2015-12-20 DIAGNOSIS — I442 Atrioventricular block, complete: Secondary | ICD-10-CM | POA: Diagnosis not present

## 2015-12-20 DIAGNOSIS — Z888 Allergy status to other drugs, medicaments and biological substances status: Secondary | ICD-10-CM | POA: Diagnosis not present

## 2015-12-20 DIAGNOSIS — I4891 Unspecified atrial fibrillation: Secondary | ICD-10-CM | POA: Insufficient documentation

## 2015-12-20 DIAGNOSIS — I481 Persistent atrial fibrillation: Secondary | ICD-10-CM

## 2015-12-20 DIAGNOSIS — N183 Chronic kidney disease, stage 3 (moderate): Secondary | ICD-10-CM | POA: Diagnosis not present

## 2015-12-20 DIAGNOSIS — Z87891 Personal history of nicotine dependence: Secondary | ICD-10-CM | POA: Insufficient documentation

## 2015-12-20 DIAGNOSIS — Z950 Presence of cardiac pacemaker: Secondary | ICD-10-CM | POA: Insufficient documentation

## 2015-12-20 DIAGNOSIS — I447 Left bundle-branch block, unspecified: Secondary | ICD-10-CM | POA: Insufficient documentation

## 2015-12-20 DIAGNOSIS — Z794 Long term (current) use of insulin: Secondary | ICD-10-CM | POA: Diagnosis not present

## 2015-12-20 DIAGNOSIS — I4819 Other persistent atrial fibrillation: Secondary | ICD-10-CM

## 2015-12-20 DIAGNOSIS — E1140 Type 2 diabetes mellitus with diabetic neuropathy, unspecified: Secondary | ICD-10-CM | POA: Diagnosis not present

## 2015-12-20 DIAGNOSIS — Z7901 Long term (current) use of anticoagulants: Secondary | ICD-10-CM | POA: Insufficient documentation

## 2015-12-20 DIAGNOSIS — E039 Hypothyroidism, unspecified: Secondary | ICD-10-CM | POA: Insufficient documentation

## 2015-12-20 LAB — COMPREHENSIVE METABOLIC PANEL
ALBUMIN: 2.7 g/dL — AB (ref 3.5–5.0)
ALT: 14 U/L — ABNORMAL LOW (ref 17–63)
ANION GAP: 11 (ref 5–15)
AST: 17 U/L (ref 15–41)
Alkaline Phosphatase: 98 U/L (ref 38–126)
BUN: 62 mg/dL — ABNORMAL HIGH (ref 6–20)
CHLORIDE: 104 mmol/L (ref 101–111)
CO2: 25 mmol/L (ref 22–32)
Calcium: 9.3 mg/dL (ref 8.9–10.3)
Creatinine, Ser: 2.98 mg/dL — ABNORMAL HIGH (ref 0.61–1.24)
GFR calc non Af Amer: 20 mL/min — ABNORMAL LOW (ref >60)
GFR, EST AFRICAN AMERICAN: 23 mL/min — AB (ref >60)
Glucose, Bld: 103 mg/dL — ABNORMAL HIGH (ref 65–99)
POTASSIUM: 4.4 mmol/L (ref 3.5–5.1)
SODIUM: 140 mmol/L (ref 135–145)
Total Bilirubin: 0.6 mg/dL (ref 0.3–1.2)
Total Protein: 7.6 g/dL (ref 6.5–8.1)

## 2015-12-20 LAB — PROTIME-INR
INR: 2.85
PROTHROMBIN TIME: 30.5 s — AB (ref 11.4–15.2)

## 2015-12-20 LAB — CBC
HCT: 29.6 % — ABNORMAL LOW (ref 39.0–52.0)
Hemoglobin: 9.1 g/dL — ABNORMAL LOW (ref 13.0–17.0)
MCH: 27 pg (ref 26.0–34.0)
MCHC: 30.7 g/dL (ref 30.0–36.0)
MCV: 87.8 fL (ref 78.0–100.0)
PLATELETS: 248 10*3/uL (ref 150–400)
RBC: 3.37 MIL/uL — ABNORMAL LOW (ref 4.22–5.81)
RDW: 16.1 % — ABNORMAL HIGH (ref 11.5–15.5)
WBC: 10.4 10*3/uL (ref 4.0–10.5)

## 2015-12-20 LAB — TSH: TSH: 5.898 u[IU]/mL — ABNORMAL HIGH (ref 0.350–4.500)

## 2015-12-20 NOTE — Patient Instructions (Signed)
Cardioversion scheduled for Thursday, November 16th  - Ar[facility] at the [facility] and go to admitting after coming to afib clinic at 11.  -Do not eat or drink anything after midnight the night prior to your procedure.  - Take all your medication with a sip of water prior to arrival.  - You will not be able to drive home after your procedure.

## 2015-12-20 NOTE — Progress Notes (Signed)
Primary Care Physician: Annamaria BootsBALE,RENU, MD Referring Physician: Dr. Wende Neighborsilley   Trayshawn D Triska is a 70 y.o. male with a h/o  Hypertensive heart disease without heart failure,  Paroxysmal atrial fibrillation, Chronic diastolic (congestive) heart failure,  Chronic kidney disease, stage 4 (severe), Long term (current) use of anticoagulants,  Presence of cardiac pacemaker,  Hypothyroidism, Hyperlipidemia,  Sleep apnea not totally compliant with CPAP, Type 2 diabetes mellitus with diabetic nephropathy. In the afib clinic for further evaluation.  By interrogation of his device, he as been in persistent afib since June of this year. His wife noted more fatigue and sleeping during the day around this period of time. He was loaded on amiodarone 200 mg bid for the last month and did not convert. He is here for further management and I think cardioversion is an feasible option to restore SR and the wife/ pt would like to proceed. He is on coumadin and will need therapeutic INR's x 4 weeks prior to cardioversion. His last INR was at Dr. York Spanielilley's office 10/27 and was 3.2.  Today, he denies symptoms of palpitations, chest pain, shortness of breath, orthopnea, PND, lower extremity edema, dizziness, presyncope, syncope, or neurologic sequela. The patient is tolerating medications without difficulties and is otherwise without complaint today.   Past Medical History:  Diagnosis Date  . Cardiac pacemaker in situ 08/03/2012   Indications transient complete heart block Insertion 08/03/12 Dr. Graciela HusbandsKlein  LV lead Medtronic 5076 serial number [id number]  LA lead Medtronic 5076 serial number [id number] Medtronic MRI compatible pulse generator serial number [id number].       . Chronic diastolic heart failure (HCC)   . Chronic kidney disease (CKD), stage III (moderate) 08/02/2012  . 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 Hyperlipidemia   . Hypertensive heart disease   . Hypothyroidism   . Intermittent  complete heart block (HCC) 11/09/2012  . LBBB (left bundle branch block) 08/02/2012  . Obesity (BMI 30-39.9)    Past Surgical History:  Procedure Laterality Date  . PERMANENT PACEMAKER INSERTION N/A 08/03/2012   Procedure: PERMANENT PACEMAKER INSERTION;  Surgeon: Duke SalviaSteven C Klein, MD;  [facility];  Service: Cardiovascular;  Laterality: N/A;    Current Outpatient Prescriptions  Medication Sig Dispense Refill  . albuterol-ipratropium (COMBIVENT) 18-103 MCG/ACT inhaler Inhale 1 puff into the lungs 4 (four) times daily.    Marland Kitchen. amLODipine (NORVASC) 5 MG tablet Take 1 tablet (5 mg total) by mouth daily. 30 tablet 12  . atorvastatin (LIPITOR) 80 MG tablet Take 40 mg by mouth at bedtime.    . Cholecalciferol (VITAMIN D) 2000 UNITS CAPS Take 2,000 Units by mouth daily.    . ferrous sulfate 325 (65 FE) MG tablet Take 325 mg by mouth 2 (two) times daily.    . fish oil-omega-3 fatty acids 1000 MG capsule Take 1 g by mouth 2 (two) times daily.    . folic acid (FOLVITE) 1 MG tablet Take 1 mg by mouth daily.    . furosemide (LASIX) 40 MG tablet 1 tablet 2 (two) times daily.    . insulin aspart (NOVOLOG) 100 UNIT/ML injection Inject 20 Units into the skin 3 (three) times daily before meals.    . insulin glargine (LANTUS) 100 UNIT/ML injection Inject 52 Units into the skin at bedtime.     Marland Kitchen. ketotifen (ZADITOR) 0.025 % ophthalmic solution Place 1 drop into both eyes 2 (two) times daily as needed.    Marland Kitchen. levothyroxine (SYNTHROID, LEVOTHROID) 25 MCG  tablet Take 25 mcg by mouth every morning. Saturday and Sunday takes 2 tablets    . metoprolol (LOPRESSOR) 50 MG tablet Take 50 mg by mouth 2 (two) times daily.    . mirtazapine (REMERON) 15 MG tablet Take 15 mg by mouth at bedtime.    Marland Kitchen. omeprazole (PRILOSEC) 20 MG capsule Take 20 mg by mouth daily.    . pentoxifylline (TRENTAL) 400 MG CR tablet Take 400 mg by mouth 3 (three) times daily after meals.    . sertraline (ZOLOFT) 100 MG tablet Take 100 mg by mouth  daily.    Marland Kitchen. thiamine (VITAMIN B-1) 100 MG tablet Take 100 mg by mouth daily.    Cliffton Asters. White Petrolatum-Mineral Oil (ARTIFICIAL TEARS) OINT ophthalmic ointment Place 1 application into both eyes every evening.    Marland Kitchen. amiodarone (PACERONE) 200 MG tablet 1 tablet daily.    Marland Kitchen. warfarin (COUMADIN) 2.5 MG tablet      No current facility-administered medications for this encounter.     Allergies  Allergen Reactions  . Lasix [Furosemide]     Social History   Social History  . Marital status: Married    Spouse name: N/A  . Number of children: N/A  . Years of education: N/A   Occupational History  . Not on file.   Social History Main Topics  . Smoking status: Former Games developermoker  . Smokeless tobacco: Not on file  . Alcohol use No  . Drug use: No  . Sexual activity: Not on file   Other Topics Concern  . Not on file   Social History Narrative  . No narrative on file    No family history on file.  ROS- All systems are reviewed and negative except as per the HPI above  Physical Exam: Vitals:   12/20/15 0836  BP: 140/66  Pulse: 61  Weight: 265 lb 3.2 oz (120.3 kg)  Height: 5\' 11"  (1.803 m)    GEN- The patient is well appearing, alert and oriented x 3 today.   Head- normocephalic, atraumatic Eyes-  Sclera clear, conjunctiva pink Ears- hearing intact Oropharynx- clear Neck- supple, no JVP Lymph- no cervical lymphadenopathy Lungs- Clear to ausculation bilaterally, normal work of breathing Heart- paced rhythm, no murmurs, rubs or gallops, PMI not laterally displaced GI- soft, NT, ND, + BS Extremities- no clubbing, cyanosis, or edema MS- no significant deformity or atrophy Skin- no rash or lesion Psych- euthymic mood, full affect Neuro- strength and sensation are intact  EKG- paced rhythm at 61 bpm, qrs int 166 ms, qtc 525 ms Partial interrogation of device and showed prsisitent afib since June of this year Dr. York Spanielilley's notes reviewed INR 10/17 3.2 from Dr.Tilley's office No  recent echo results visible in Epic  Assessment and Plan: 1. Persistent symptomatic afib Has loaded on amiodarone x one month but has not restored SR Continue amiodarone at 200 mg a day Risks vrs benefit of DCCV discussed  Continue warfarin and will plan on obtaining 4 weeks of therapeutic INR's and then tentatively schedule for DCCV 11/13 Encouraged to use cpap for at least 4 hours a night to help efforts at restoring SR  F/u with INR 11/9, repeat INR 11/13 and if all are therapeutic,  tentative cardioversion later in day   Lupita LeashDonna C. Matthew Folksarroll, ANP-C Afib Clinic [facility] [address] [place], [place] [address] [phone]

## 2015-12-27 ENCOUNTER — Ambulatory Visit (HOSPITAL_COMMUNITY)
Admission: RE | Admit: 2015-12-27 | Discharge: 2015-12-27 | Disposition: A | Discharge status: Home / Self Care | Payer: Medicare HMO | Source: Ambulatory Visit | Attending: Nurse Practitioner | Admitting: Nurse Practitioner

## 2015-12-27 DIAGNOSIS — I481 Persistent atrial fibrillation: Secondary | ICD-10-CM | POA: Diagnosis present

## 2015-12-27 DIAGNOSIS — I480 Paroxysmal atrial fibrillation: Secondary | ICD-10-CM | POA: Diagnosis not present

## 2015-12-27 LAB — PROTIME-INR
INR: 2.62
PROTHROMBIN TIME: 28.5 s — AB (ref 11.4–15.2)

## 2015-12-27 LAB — BASIC METABOLIC PANEL
ANION GAP: 9 (ref 5–15)
BUN: 60 mg/dL — ABNORMAL HIGH (ref 6–20)
CALCIUM: 9 mg/dL (ref 8.9–10.3)
CHLORIDE: 104 mmol/L (ref 101–111)
CO2: 26 mmol/L (ref 22–32)
Creatinine, Ser: 3.13 mg/dL — ABNORMAL HIGH (ref 0.61–1.24)
GFR calc Af Amer: 22 mL/min — ABNORMAL LOW (ref >60)
GFR calc non Af Amer: 19 mL/min — ABNORMAL LOW (ref >60)
GLUCOSE: 78 mg/dL (ref 65–99)
Potassium: 3.9 mmol/L (ref 3.5–5.1)
Sodium: 139 mmol/L (ref 135–145)

## 2015-12-27 LAB — TSH: TSH: 8.141 u[IU]/mL — AB (ref 0.350–4.500)

## 2015-12-27 LAB — CBC
HCT: 27.9 % — ABNORMAL LOW (ref 39.0–52.0)
HEMOGLOBIN: 8.7 g/dL — AB (ref 13.0–17.0)
MCH: 27.4 pg (ref 26.0–34.0)
MCHC: 31.2 g/dL (ref 30.0–36.0)
MCV: 88 fL (ref 78.0–100.0)
Platelets: 214 10*3/uL (ref 150–400)
RBC: 3.17 MIL/uL — AB (ref 4.22–5.81)
RDW: 16.3 % — ABNORMAL HIGH (ref 11.5–15.5)
WBC: 8 10*3/uL (ref 4.0–10.5)

## 2016-01-03 ENCOUNTER — Ambulatory Visit (HOSPITAL_COMMUNITY): Payer: Medicare HMO | Admitting: Certified Registered Nurse Anesthetist

## 2016-01-03 ENCOUNTER — Ambulatory Visit (HOSPITAL_COMMUNITY)
Admission: RE | Admit: 2016-01-03 | Discharge: 2016-01-03 | Disposition: A | Discharge status: Home / Self Care | Payer: Medicare HMO | Source: Ambulatory Visit | Attending: Cardiology | Admitting: Cardiology

## 2016-01-03 ENCOUNTER — Ambulatory Visit (HOSPITAL_BASED_OUTPATIENT_CLINIC_OR_DEPARTMENT_OTHER)
Admission: RE | Admit: 2016-01-03 | Discharge: 2016-01-03 | Disposition: A | Discharge status: Home / Self Care | Payer: Medicare HMO | Source: Ambulatory Visit | Attending: Nurse Practitioner | Admitting: Nurse Practitioner

## 2016-01-03 ENCOUNTER — Encounter (HOSPITAL_COMMUNITY)
Admission: RE | Disposition: A | Discharge status: Home / Self Care | Payer: Self-pay | Source: Ambulatory Visit | Attending: Cardiology

## 2016-01-03 ENCOUNTER — Encounter (HOSPITAL_COMMUNITY): Payer: Self-pay | Admitting: Nurse Practitioner

## 2016-01-03 ENCOUNTER — Encounter (HOSPITAL_COMMUNITY): Payer: Self-pay | Admitting: Certified Registered Nurse Anesthetist

## 2016-01-03 VITALS — BP 126/58 | HR 62 | Ht 71.0 in | Wt 265.0 lb

## 2016-01-03 DIAGNOSIS — I4819 Other persistent atrial fibrillation: Secondary | ICD-10-CM

## 2016-01-03 DIAGNOSIS — I480 Paroxysmal atrial fibrillation: Secondary | ICD-10-CM | POA: Insufficient documentation

## 2016-01-03 DIAGNOSIS — I5032 Chronic diastolic (congestive) heart failure: Secondary | ICD-10-CM | POA: Insufficient documentation

## 2016-01-03 DIAGNOSIS — I481 Persistent atrial fibrillation: Secondary | ICD-10-CM | POA: Diagnosis not present

## 2016-01-03 DIAGNOSIS — Z794 Long term (current) use of insulin: Secondary | ICD-10-CM | POA: Diagnosis not present

## 2016-01-03 DIAGNOSIS — Z6837 Body mass index (BMI) 37.0-37.9, adult: Secondary | ICD-10-CM | POA: Diagnosis not present

## 2016-01-03 DIAGNOSIS — E1122 Type 2 diabetes mellitus with diabetic chronic kidney disease: Secondary | ICD-10-CM | POA: Diagnosis not present

## 2016-01-03 DIAGNOSIS — I130 Hypertensive heart and chronic kidney disease with heart failure and stage 1 through stage 4 chronic kidney disease, or unspecified chronic kidney disease: Secondary | ICD-10-CM | POA: Insufficient documentation

## 2016-01-03 DIAGNOSIS — I442 Atrioventricular block, complete: Secondary | ICD-10-CM | POA: Insufficient documentation

## 2016-01-03 DIAGNOSIS — Z79899 Other long term (current) drug therapy: Secondary | ICD-10-CM | POA: Insufficient documentation

## 2016-01-03 DIAGNOSIS — E785 Hyperlipidemia, unspecified: Secondary | ICD-10-CM | POA: Diagnosis not present

## 2016-01-03 DIAGNOSIS — I4891 Unspecified atrial fibrillation: Secondary | ICD-10-CM | POA: Diagnosis not present

## 2016-01-03 DIAGNOSIS — Z950 Presence of cardiac pacemaker: Secondary | ICD-10-CM | POA: Diagnosis not present

## 2016-01-03 DIAGNOSIS — I447 Left bundle-branch block, unspecified: Secondary | ICD-10-CM | POA: Insufficient documentation

## 2016-01-03 DIAGNOSIS — N183 Chronic kidney disease, stage 3 (moderate): Secondary | ICD-10-CM | POA: Insufficient documentation

## 2016-01-03 DIAGNOSIS — Z7901 Long term (current) use of anticoagulants: Secondary | ICD-10-CM | POA: Diagnosis not present

## 2016-01-03 DIAGNOSIS — G4730 Sleep apnea, unspecified: Secondary | ICD-10-CM | POA: Insufficient documentation

## 2016-01-03 DIAGNOSIS — Z87891 Personal history of nicotine dependence: Secondary | ICD-10-CM | POA: Insufficient documentation

## 2016-01-03 DIAGNOSIS — E039 Hypothyroidism, unspecified: Secondary | ICD-10-CM | POA: Insufficient documentation

## 2016-01-03 HISTORY — PX: CARDIOVERSION: SHX1299

## 2016-01-03 LAB — PROTIME-INR
INR: 2.22
Prothrombin Time: 25 seconds — ABNORMAL HIGH (ref 11.4–15.2)

## 2016-01-03 LAB — GLUCOSE, CAPILLARY: GLUCOSE-CAPILLARY: 72 mg/dL (ref 65–99)

## 2016-01-03 SURGERY — CARDIOVERSION
Anesthesia: Monitor Anesthesia Care

## 2016-01-03 MED ORDER — SODIUM CHLORIDE 0.9 % IV SOLN
INTRAVENOUS | Status: DC
  Administered 2016-01-03: 12:00:00 via INTRAVENOUS

## 2016-01-03 MED ORDER — PROPOFOL 10 MG/ML IV BOLUS
INTRAVENOUS | Status: DC | PRN
  Administered 2016-01-03: 80 mg via INTRAVENOUS

## 2016-01-03 MED ORDER — LIDOCAINE 2% (20 MG/ML) 5 ML SYRINGE
INTRAMUSCULAR | Status: DC | PRN
  Administered 2016-01-03: 40 mg via INTRAVENOUS

## 2016-01-03 NOTE — Anesthesia Preprocedure Evaluation (Signed)
Anesthesia Evaluation  Patient identified by MRN, date of birth, ID band Patient awake    Reviewed: Allergy & Precautions, NPO status , Patient's Chart, lab work & pertinent test results  Airway Mallampati: III  TM Distance: <3 FB Neck ROM: Limited    Dental  (+) Edentulous Upper, Edentulous Lower   Pulmonary shortness of breath, former smoker,    breath sounds clear to auscultation       Cardiovascular + dysrhythmias + pacemaker  Rhythm:Irregular Rate:Normal     Neuro/Psych    GI/Hepatic   Endo/Other  diabetesHypothyroidism Morbid obesity  Renal/GU Renal disease     Musculoskeletal   Abdominal   Peds  Hematology   Anesthesia Other Findings   Reproductive/Obstetrics                             Anesthesia Physical Anesthesia Plan  ASA: III  Anesthesia Plan:    Post-op Pain Management:    Induction: Intravenous  Airway Management Planned: Simple Face Mask  Additional Equipment:   Intra-op Plan:   Post-operative Plan: Extubation in OR  Informed Consent: I have reviewed the patients History and Physical, chart, labs and discussed the procedure including the risks, benefits and alternatives for the proposed anesthesia with the patient or authorized representative who has indicated his/her understanding and acceptance.     Plan Discussed with:   Anesthesia Plan Comments:         Anesthesia Quick Evaluation

## 2016-01-03 NOTE — Interval H&P Note (Signed)
History and Physical Interval Note:  01/03/2016 1:37 PM  [name]  has presented today for surgery, with the diagnosis of AFIB  The various methods of treatment have been discussed with the patient and family. After consideration of risks, benefits and other options for treatment, the patient has consented to  Procedure(s): CARDIOVERSION (N/A) as a surgical intervention .  The patient's history has been reviewed, patient examined, no change in status, stable for surgery.  I have reviewed the patient's chart and labs.  Questions were answered to the patient's satisfaction.     Chaye Misch [employer]

## 2016-01-03 NOTE — Progress Notes (Signed)
Primary Care Physician: Annamaria BootsBALE,RENU, MD Referring Physician: Dr. Donnie Ahoilley Cardiologist: Dr. Wende Neighborsilley   [name] is a 70 y.o. male with a h/o  hypertensive heart disease,, paroxysmal atrial fibrillationchronic diastolic (congestive) heart failure,  chronic kidney disease, stage 4 , Long term (current) use of anticoagulants,  presence of cardiac pacemaker, hypothyroidism, hyperlipidemia, sleep apnea not totally compliant with CPAP, type 2 diabetes mellitus with diabetic nephropathy. In the afib clinic with wife, who gives most of history, referred by Dr. Donnie Ahoilley, in his short term medical absence,  to be considered for cardioverion  By interrogation of his device, he as been in persistent afib since June of this year. His wife noted more fatigue and sleeping during the day around this period of time. He was loaded on amiodarone 200 mg bid for the last month and did not convert. He is here for further management and I think cardioversion is an feasible option to restore SR and the wife/ pt would like to proceed. He is on coumadin and will need therapeutic INR's x 4 weeks prior to cardioversion. His last INR  at Dr. York Spanielilley's office 10/27 at 3.2 .INR's checked here 11/2, 2.85, 11-9,2.62 and today 2.22. His baseline labs are abnormal, and I did not have a lot of previous labs to compare. But I checked with  [facility] and she confirmed that his labs are at baseline. TSH is probably slightly higher reflecting amiodarone load and will need recheck in the near future.  Today, he denies symptoms of palpitations, chest pain, shortness of breath, orthopnea, PND, lower extremity edema, dizziness, presyncope, syncope, or neurologic sequela. The patient is tolerating medications without difficulties and is otherwise without complaint today.   Past Medical History:  Diagnosis Date  . Cardiac pacemaker in situ 08/03/2012   Indications transient complete heart block Insertion  08/03/12 Dr. Graciela HusbandsKlein  LV lead Medtronic 5076 serial number [id number]  LA lead Medtronic 5076 serial number [id number] Medtronic MRI compatible pulse generator serial number [id number].       . Chronic diastolic heart failure (HCC)   . Chronic kidney disease (CKD), stage III (moderate) 08/02/2012  . 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 Hyperlipidemia   . Hypertensive heart disease   . Hypothyroidism   . 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 LBBB (left bundle branch block) 08/02/2012  . Obesity (BMI 30-39.9)    Past Surgical History:  Procedure Laterality Date  . PERMANENT PACEMAKER INSERTION N/A 08/03/2012   Procedure: PERMANENT PACEMAKER INSERTION;  Surgeon: Duke SalviaSteven C Klein, MD;  [facility];  Service: Cardiovascular;  Laterality: N/A;    No current outpatient prescriptions on file.   No current facility-administered medications for this encounter.     Allergies  Allergen Reactions  . Lasix [Furosemide]     Social History   Social History  . Marital status: Married    Spouse name: N/A  . Number of children: N/A  . Years of education: N/A   Occupational History  . Not on file.   Social History Main Topics  . Smoking status: Former Games developermoker  . Smokeless tobacco: Not on file  . Alcohol use No  . Drug use: No  . Sexual activity: Not on file   Other Topics Concern  . Not on file   Social History Narrative  . No narrative on file    No family history on file.  ROS- All systems are reviewed and  negative except as per the HPI above  Physical Exam: Vitals:   01/03/16 1046  BP: (!) 126/58  Pulse: 62  Weight: 265 lb (120.2 kg)  Height: 5\' 11"  (1.803 m)    GEN- The patient is well appearing, alert and oriented x 3 today.   Head- normocephalic, atraumatic Eyes-  Sclera clear, conjunctiva pink Ears- hearing intact Oropharynx- clear Neck- supple, no JVP Lymph- no cervical lymphadenopathy Lungs- Clear to ausculation  bilaterally, normal work of breathing Heart- paced rhythm, no murmurs, rubs or gallops, PMI not laterally displaced GI- soft, NT, ND, + BS Extremities- no clubbing, cyanosis, or edema MS- no significant deformity or atrophy Skin- no rash or lesion Psych- euthymic mood, full affect Neuro- strength and sensation are intact  EKG- paced rhythm at 61 bpm, qrs int 166 ms, qtc 525 ms Partial interrogation of device and showed prsisitent afib since June of this year Dr. York Spanielilley's notes reviewed INR 10/27 at 3.2 .INR's checked here 11/2, 2.85, 11-9,2.62 and today 2.22 No recent echo results visible in Epic  Assessment and Plan: 1. Persistent symptomatic afib Loaded on amiodarone x one month by Dr. Donnie Ahoilley but has not restored SR Continue amiodarone at 200 mg a day Risks vrs benefit of DCCV discussed  INR therapeutic this am and now has had 4 weeks of therapeutic INR's Confirmed this am with interrogation that he continues in afib He will proceed to cardioversion at 1pm today   F/u with Dr. Donnie Ahoilley as scheduled 12/5   Elvina Sidleonna C. Matthew Folksarroll, ANP-C Afib Clinic [facility] [address] [place], [place] [address] [phone]

## 2016-01-03 NOTE — Transfer of Care (Signed)
Immediate Anesthesia Transfer of Care Note  Patient: Veverly FellsMichael D Buesing  Procedure(s) Performed: Procedure(s): CARDIOVERSION (N/A)  Patient Location: Endoscopy Unit  Anesthesia Type:General  Level of Consciousness: awake, alert  and oriented  Airway & Oxygen Therapy: Patient Spontanous Breathing  Post-op Assessment: Report given to RN and Post -op Vital signs reviewed and stable  Post vital signs: Reviewed and stable  Last Vitals:  Vitals:   01/03/16 1240 01/03/16 1253  BP:    Pulse:  60  Resp: 19   Temp:      Last Pain:  Vitals:   01/03/16 1200  TempSrc: Oral         Complications: No apparent anesthesia complications

## 2016-01-03 NOTE — H&P (View-Only) (Signed)
Primary Care Physician: Annamaria BootsBALE,RENU, MD Referring Physician: Dr. Donnie Ahoilley Cardiologist: Dr. Wende Neighborsilley   [name] is a 70 y.o. male with a h/o  hypertensive heart disease,, paroxysmal atrial fibrillationchronic diastolic (congestive) heart failure,  chronic kidney disease, stage 4 , Long term (current) use of anticoagulants,  presence of cardiac pacemaker, hypothyroidism, hyperlipidemia, sleep apnea not totally compliant with CPAP, type 2 diabetes mellitus with diabetic nephropathy. In the afib clinic with wife, who gives most of history, referred by Dr. Donnie Ahoilley, in his short term medical absence,  to be considered for cardioverion  By interrogation of his device, he as been in persistent afib since June of this year. His wife noted more fatigue and sleeping during the day around this period of time. He was loaded on amiodarone 200 mg bid for the last month and did not convert. He is here for further management and I think cardioversion is an feasible option to restore SR and the wife/ pt would like to proceed. He is on coumadin and will need therapeutic INR's x 4 weeks prior to cardioversion. His last INR  at Dr. York Spanielilley's office 10/27 at 3.2 .INR's checked here 11/2, 2.85, 11-9,2.62 and today 2.22. His baseline labs are abnormal, and I did not have a lot of previous labs to compare. But I checked with  his MD[facility] and she confirmed that his labs are at baseline. TSH is probably slightly higher reflecting amiodarone load and will need recheck in the near future.  Today, he denies symptoms of palpitations, chest pain, shortness of breath, orthopnea, PND, lower extremity edema, dizziness, presyncope, syncope, or neurologic sequela. The patient is tolerating medications without difficulties and is otherwise without complaint today.   Past Medical History:  Diagnosis Date  . Cardiac pacemaker in situ 08/03/2012   Indications transient complete heart block Insertion  08/03/12 Dr. Graciela HusbandsKlein  LV lead Medtronic 5076 serial number [id number]  LA lead Medtronic 5076 serial number [id number] Medtronic MRI compatible pulse generator serial number [id number].       . Chronic diastolic heart failure (HCC)   . Chronic kidney disease (CKD), stage III (moderate) 08/02/2012  . 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 Hyperlipidemia   . Hypertensive heart disease   . Hypothyroidism   . 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 LBBB (left bundle branch block) 08/02/2012  . Obesity (BMI 30-39.9)    Past Surgical History:  Procedure Laterality Date  . PERMANENT PACEMAKER INSERTION N/A 08/03/2012   Procedure: PERMANENT PACEMAKER INSERTION;  Surgeon: Duke SalviaSteven C Klein, MD;  [facility];  Service: Cardiovascular;  Laterality: N/A;    No current outpatient prescriptions on file.   No current facility-administered medications for this encounter.     Allergies  Allergen Reactions  . Lasix [Furosemide]     Social History   Social History  . Marital status: Married    Spouse name: N/A  . Number of children: N/A  . Years of education: N/A   Occupational History  . Not on file.   Social History Main Topics  . Smoking status: Former Games developermoker  . Smokeless tobacco: Not on file  . Alcohol use No  . Drug use: No  . Sexual activity: Not on file   Other Topics Concern  . Not on file   Social History Narrative  . No narrative on file    No family history on file.  ROS- All systems are reviewed and  negative except as per the HPI above  Physical Exam: Vitals:   01/03/16 1046  BP: (!) 126/58  Pulse: 62  Weight: 265 lb (120.2 kg)  Height: 5' 11" (1.803 m)    GEN- The patient is well appearing, alert and oriented x 3 today.   Head- normocephalic, atraumatic Eyes-  Sclera clear, conjunctiva pink Ears- hearing intact Oropharynx- clear Neck- supple, no JVP Lymph- no cervical lymphadenopathy Lungs- Clear to ausculation  bilaterally, normal work of breathing Heart- paced rhythm, no murmurs, rubs or gallops, PMI not laterally displaced GI- soft, NT, ND, + BS Extremities- no clubbing, cyanosis, or edema MS- no significant deformity or atrophy Skin- no rash or lesion Psych- euthymic mood, full affect Neuro- strength and sensation are intact  EKG- paced rhythm at 61 bpm, qrs int 166 ms, qtc 525 ms Partial interrogation of device and showed prsisitent afib since June of this year Dr. Tilley's notes reviewed INR 10/27 at 3.2 .INR's checked here 11/2, 2.85, 11-9,2.62 and today 2.22 No recent echo results visible in Epic  Assessment and Plan: 1. Persistent symptomatic afib Loaded on amiodarone x one month by Dr. Tilley but has not restored SR Continue amiodarone at 200 mg a day Risks vrs benefit of DCCV discussed  INR therapeutic this am and now has had 4 weeks of therapeutic INR's Confirmed this am with interrogation that he continues in afib He will proceed to cardioversion at 1pm today   F/u with Dr. Tilley as scheduled 12/5   [name] C. Areeb Corron, ANP-C Af[facility] [address] [place], [place] [address] [phone]  

## 2016-01-03 NOTE — Procedures (Signed)
Electrical Cardioversion Procedure Note [name] [id number] January 15, 1946  Procedure: Electrical Cardioversion Indications:  Atrial Fibrillation.  INR therapeutic.   Procedure Details Consent: Risks of procedure as well as the alternatives and risks of each were explained to the (patient/caregiver).  Consent for procedure obtained. Time Out: Verified patient identification, verified procedure, site/side was marked, verified correct patient position, special equipment/implants available, medications/allergies/relevent history reviewed, required imaging and test results available.  Performed  Patient placed on cardiac monitor, pulse oximetry, supplemental oxygen as necessary.  Sedation given: Propofol per anesthesiology Pacer pads placed anterior and posterior chest.  Cardioverted 1 time(s).  Cardioverted at 200J.  Evaluation Findings: Post procedure EKG shows: NSR Complications: None Patient did tolerate procedure well.  Pacemaker checked after procedure, functioning normally.    [name] 01/03/2016, 1:41 PM

## 2016-01-03 NOTE — Anesthesia Postprocedure Evaluation (Signed)
Anesthesia Post Note  Patient: Veverly FellsMichael D Grabbe  Procedure(s) Performed: Procedure(s) (LRB): CARDIOVERSION (N/A)  Patient location during evaluation: PACU Anesthesia Type: General Level of consciousness: awake and alert Pain management: pain level controlled Vital Signs Assessment: post-procedure vital signs reviewed and stable Respiratory status: spontaneous breathing, nonlabored ventilation, respiratory function stable and patient connected to nasal cannula oxygen Cardiovascular status: blood pressure returned to baseline and stable Postop Assessment: no signs of nausea or vomiting Anesthetic complications: no    Last Vitals:  Vitals:   01/03/16 1400 01/03/16 1405  BP: (!) 136/56 140/69  Pulse: (!) 59 (!) 59  Resp: 17 16  Temp:      Last Pain:  Vitals:   01/03/16 1200  TempSrc: Oral                 Mccall Will,JAMES TERRILL

## 2016-01-03 NOTE — Discharge Instructions (Signed)
Electrical Cardioversion, Care After °This sheet gives you information about how to care for yourself after your procedure. Your health care provider may also give you more specific instructions. If you have problems or questions, contact your health care provider. °What can I expect after the procedure? °After the procedure, it is common to have: °· Some redness on the skin where the shocks were given. °Follow these instructions at home: °· Do not drive for 24 hours if you were given a medicine to help you relax (sedative). °· Take over-the-counter and prescription medicines only as told by your health care provider. °· Ask your health care provider how to check your pulse. Check it often. °· Rest for 48 hours after the procedure or as told by your health care provider. °· Avoid or limit your caffeine use as told by your health care provider. °Contact a health care provider if: °· You feel like your heart is beating too quickly or your pulse is not regular. °· You have a serious muscle cramp that does not go away. °Get help right away if: °· You have discomfort in your chest. °· You are dizzy or you feel faint. °· You have trouble breathing or you are short of breath. °· Your speech is slurred. °· You have trouble moving an arm or leg on one side of your body. °· Your fingers or toes turn cold or blue. °This information is not intended to replace advice given to you by your health care provider. Make sure you discuss any questions you have with your health care provider. °Document Released: 11/24/2012 Document Revised: 09/07/2015 Document Reviewed: 08/10/2015 °Elsevier Interactive Patient Education © 2017 Elsevier Inc. ° ° ° ° ° ° ° ° ° ° ° ° ° ° ° ° ° ° ° ° ° ° ° ° ° ° ° °Monitored Anesthesia Care, Care After °These instructions provide you with information about caring for yourself after your procedure. Your health care provider may also give you more specific instructions. Your treatment has been planned  according to current medical practices, but problems sometimes occur. Call your health care provider if you have any problems or questions after your procedure. °What can I expect after the procedure? °After your procedure, it is common to: °· Feel sleepy for several hours. °· Feel clumsy and have poor balance for several hours. °· Feel forgetful about what happened after the procedure. °· Have poor judgment for several hours. °· Feel nauseous or vomit. °· Have a sore throat if you had a breathing tube during the procedure. °Follow these instructions at home: °For at least 24 hours after the procedure:  °· Do not: °¨ Participate in activities in which you could fall or become injured. °¨ Drive. °¨ Use heavy machinery. °¨ Drink alcohol. °¨ Take sleeping pills or medicines that cause drowsiness. °¨ Make important decisions or sign legal documents. °¨ Take care of children on your own. °· Rest. °Eating and drinking °· Follow the diet that is recommended by your health care provider. °· If you vomit, drink water, juice, or soup when you can drink without vomiting. °· Make sure you have little or no nausea before eating solid foods. °General instructions °· Have a responsible adult stay with you until you are awake and alert. °· Take over-the-counter and prescription medicines only as told by your health care provider. °· If you smoke, do not smoke without supervision. °· Keep all follow-up visits as told by your health care provider. This is important. °  Contact a health care provider if: °· You keep feeling nauseous or you keep vomiting. °· You feel light-headed. °· You develop a rash. °· You have a fever. °Get help right away if: °· You have trouble breathing. °This information is not intended to replace advice given to you by your health care provider. Make sure you discuss any questions you have with your health care provider. °Document Released: 05/27/2015 Document Revised: 09/26/2015 Document Reviewed:  05/27/2015 °Elsevier Interactive Patient Education © 2017 Elsevier Inc. ° °

## 2016-01-04 ENCOUNTER — Encounter (HOSPITAL_COMMUNITY): Payer: Self-pay | Admitting: Cardiology

## 2016-01-15 ENCOUNTER — Encounter (HOSPITAL_COMMUNITY): Payer: Self-pay

## 2016-01-15 ENCOUNTER — Emergency Department (HOSPITAL_COMMUNITY): Payer: Medicare HMO

## 2016-01-15 ENCOUNTER — Inpatient Hospital Stay (HOSPITAL_COMMUNITY)
Admission: EM | Admit: 2016-01-15 | Discharge: 2016-02-01 | DRG: 264 | Disposition: A | Discharge status: Home Health | Payer: Medicare HMO | Attending: Internal Medicine | Admitting: Internal Medicine

## 2016-01-15 DIAGNOSIS — F329 Major depressive disorder, single episode, unspecified: Secondary | ICD-10-CM | POA: Diagnosis present

## 2016-01-15 DIAGNOSIS — N170 Acute kidney failure with tubular necrosis: Secondary | ICD-10-CM

## 2016-01-15 DIAGNOSIS — E1122 Type 2 diabetes mellitus with diabetic chronic kidney disease: Secondary | ICD-10-CM | POA: Diagnosis present

## 2016-01-15 DIAGNOSIS — J189 Pneumonia, unspecified organism: Secondary | ICD-10-CM | POA: Diagnosis present

## 2016-01-15 DIAGNOSIS — Z8679 Personal history of other diseases of the circulatory system: Secondary | ICD-10-CM

## 2016-01-15 DIAGNOSIS — Z79899 Other long term (current) drug therapy: Secondary | ICD-10-CM

## 2016-01-15 DIAGNOSIS — N189 Chronic kidney disease, unspecified: Secondary | ICD-10-CM

## 2016-01-15 DIAGNOSIS — N186 End stage renal disease: Secondary | ICD-10-CM | POA: Diagnosis present

## 2016-01-15 DIAGNOSIS — I10 Essential (primary) hypertension: Secondary | ICD-10-CM

## 2016-01-15 DIAGNOSIS — E1121 Type 2 diabetes mellitus with diabetic nephropathy: Secondary | ICD-10-CM | POA: Diagnosis present

## 2016-01-15 DIAGNOSIS — Z452 Encounter for adjustment and management of vascular access device: Secondary | ICD-10-CM

## 2016-01-15 DIAGNOSIS — E039 Hypothyroidism, unspecified: Secondary | ICD-10-CM | POA: Diagnosis present

## 2016-01-15 DIAGNOSIS — E1151 Type 2 diabetes mellitus with diabetic peripheral angiopathy without gangrene: Secondary | ICD-10-CM | POA: Diagnosis present

## 2016-01-15 DIAGNOSIS — IMO0001 Reserved for inherently not codable concepts without codable children: Secondary | ICD-10-CM

## 2016-01-15 DIAGNOSIS — I5020 Unspecified systolic (congestive) heart failure: Secondary | ICD-10-CM

## 2016-01-15 DIAGNOSIS — R0600 Dyspnea, unspecified: Secondary | ICD-10-CM

## 2016-01-15 DIAGNOSIS — Z888 Allergy status to other drugs, medicaments and biological substances status: Secondary | ICD-10-CM

## 2016-01-15 DIAGNOSIS — E119 Type 2 diabetes mellitus without complications: Secondary | ICD-10-CM

## 2016-01-15 DIAGNOSIS — R34 Anuria and oliguria: Secondary | ICD-10-CM | POA: Diagnosis present

## 2016-01-15 DIAGNOSIS — Z794 Long term (current) use of insulin: Secondary | ICD-10-CM

## 2016-01-15 DIAGNOSIS — N2581 Secondary hyperparathyroidism of renal origin: Secondary | ICD-10-CM | POA: Diagnosis present

## 2016-01-15 DIAGNOSIS — I5033 Acute on chronic diastolic (congestive) heart failure: Secondary | ICD-10-CM | POA: Diagnosis not present

## 2016-01-15 DIAGNOSIS — I132 Hypertensive heart and chronic kidney disease with heart failure and with stage 5 chronic kidney disease, or end stage renal disease: Secondary | ICD-10-CM | POA: Diagnosis not present

## 2016-01-15 DIAGNOSIS — G4733 Obstructive sleep apnea (adult) (pediatric): Secondary | ICD-10-CM | POA: Diagnosis present

## 2016-01-15 DIAGNOSIS — J181 Lobar pneumonia, unspecified organism: Secondary | ICD-10-CM

## 2016-01-15 DIAGNOSIS — E1140 Type 2 diabetes mellitus with diabetic neuropathy, unspecified: Secondary | ICD-10-CM | POA: Diagnosis present

## 2016-01-15 DIAGNOSIS — D638 Anemia in other chronic diseases classified elsewhere: Secondary | ICD-10-CM

## 2016-01-15 DIAGNOSIS — Z950 Presence of cardiac pacemaker: Secondary | ICD-10-CM | POA: Diagnosis present

## 2016-01-15 DIAGNOSIS — R319 Hematuria, unspecified: Secondary | ICD-10-CM | POA: Diagnosis present

## 2016-01-15 DIAGNOSIS — N179 Acute kidney failure, unspecified: Secondary | ICD-10-CM

## 2016-01-15 DIAGNOSIS — N185 Chronic kidney disease, stage 5: Secondary | ICD-10-CM

## 2016-01-15 DIAGNOSIS — E11649 Type 2 diabetes mellitus with hypoglycemia without coma: Secondary | ICD-10-CM | POA: Diagnosis not present

## 2016-01-15 DIAGNOSIS — D631 Anemia in chronic kidney disease: Secondary | ICD-10-CM | POA: Diagnosis present

## 2016-01-15 DIAGNOSIS — J440 Chronic obstructive pulmonary disease with acute lower respiratory infection: Secondary | ICD-10-CM | POA: Diagnosis present

## 2016-01-15 DIAGNOSIS — L8990 Pressure ulcer of unspecified site, unspecified stage: Secondary | ICD-10-CM | POA: Insufficient documentation

## 2016-01-15 DIAGNOSIS — I481 Persistent atrial fibrillation: Secondary | ICD-10-CM | POA: Diagnosis present

## 2016-01-15 DIAGNOSIS — E785 Hyperlipidemia, unspecified: Secondary | ICD-10-CM | POA: Diagnosis present

## 2016-01-15 DIAGNOSIS — Z09 Encounter for follow-up examination after completed treatment for conditions other than malignant neoplasm: Secondary | ICD-10-CM

## 2016-01-15 DIAGNOSIS — E669 Obesity, unspecified: Secondary | ICD-10-CM | POA: Diagnosis present

## 2016-01-15 DIAGNOSIS — I252 Old myocardial infarction: Secondary | ICD-10-CM

## 2016-01-15 DIAGNOSIS — J441 Chronic obstructive pulmonary disease with (acute) exacerbation: Secondary | ICD-10-CM

## 2016-01-15 DIAGNOSIS — I509 Heart failure, unspecified: Secondary | ICD-10-CM | POA: Insufficient documentation

## 2016-01-15 DIAGNOSIS — Z7901 Long term (current) use of anticoagulants: Secondary | ICD-10-CM

## 2016-01-15 DIAGNOSIS — Z23 Encounter for immunization: Secondary | ICD-10-CM

## 2016-01-15 DIAGNOSIS — R791 Abnormal coagulation profile: Secondary | ICD-10-CM | POA: Diagnosis present

## 2016-01-15 LAB — CBC WITH DIFFERENTIAL/PLATELET
BASOS PCT: 0 %
Basophils Absolute: 0 10*3/uL (ref 0.0–0.1)
Eosinophils Absolute: 0.3 10*3/uL (ref 0.0–0.7)
Eosinophils Relative: 3 %
HEMATOCRIT: 28 % — AB (ref 39.0–52.0)
Hemoglobin: 8.5 g/dL — ABNORMAL LOW (ref 13.0–17.0)
LYMPHS ABS: 1.5 10*3/uL (ref 0.7–4.0)
Lymphocytes Relative: 18 %
MCH: 27.6 pg (ref 26.0–34.0)
MCHC: 30.4 g/dL (ref 30.0–36.0)
MCV: 90.9 fL (ref 78.0–100.0)
MONO ABS: 0.6 10*3/uL (ref 0.1–1.0)
MONOS PCT: 7 %
NEUTROS ABS: 6.3 10*3/uL (ref 1.7–7.7)
Neutrophils Relative %: 72 %
Platelets: 238 10*3/uL (ref 150–400)
RBC: 3.08 MIL/uL — ABNORMAL LOW (ref 4.22–5.81)
RDW: 18.4 % — AB (ref 11.5–15.5)
WBC: 8.7 10*3/uL (ref 4.0–10.5)

## 2016-01-15 LAB — COMPREHENSIVE METABOLIC PANEL
ALBUMIN: 2.9 g/dL — AB (ref 3.5–5.0)
ALT: 13 U/L — ABNORMAL LOW (ref 17–63)
ANION GAP: 11 (ref 5–15)
AST: 17 U/L (ref 15–41)
Alkaline Phosphatase: 82 U/L (ref 38–126)
BILIRUBIN TOTAL: 0.6 mg/dL (ref 0.3–1.2)
BUN: 64 mg/dL — ABNORMAL HIGH (ref 6–20)
CALCIUM: 9.3 mg/dL (ref 8.9–10.3)
CO2: 22 mmol/L (ref 22–32)
Chloride: 107 mmol/L (ref 101–111)
Creatinine, Ser: 3.58 mg/dL — ABNORMAL HIGH (ref 0.61–1.24)
GFR, EST AFRICAN AMERICAN: 18 mL/min — AB (ref >60)
GFR, EST NON AFRICAN AMERICAN: 16 mL/min — AB (ref >60)
Glucose, Bld: 47 mg/dL — ABNORMAL LOW (ref 65–99)
POTASSIUM: 3.9 mmol/L (ref 3.5–5.1)
Sodium: 140 mmol/L (ref 135–145)
TOTAL PROTEIN: 7.6 g/dL (ref 6.5–8.1)

## 2016-01-15 LAB — GLUCOSE, CAPILLARY
GLUCOSE-CAPILLARY: 70 mg/dL (ref 65–99)
Glucose-Capillary: 80 mg/dL (ref 65–99)

## 2016-01-15 LAB — CBG MONITORING, ED: Glucose-Capillary: 110 mg/dL — ABNORMAL HIGH (ref 65–99)

## 2016-01-15 LAB — PROCALCITONIN: Procalcitonin: 0.1 ng/mL

## 2016-01-15 LAB — I-STAT CG4 LACTIC ACID, ED: Lactic Acid, Venous: 1.17 mmol/L (ref 0.5–1.9)

## 2016-01-15 LAB — ABO/RH: ABO/RH(D): B NEG

## 2016-01-15 LAB — TROPONIN I

## 2016-01-15 LAB — TSH: TSH: 6.428 u[IU]/mL — ABNORMAL HIGH (ref 0.350–4.500)

## 2016-01-15 LAB — TYPE AND SCREEN
ABO/RH(D): B NEG
Antibody Screen: NEGATIVE

## 2016-01-15 LAB — PROTIME-INR
INR: 4.68
Prothrombin Time: 45.4 seconds — ABNORMAL HIGH (ref 11.4–15.2)

## 2016-01-15 LAB — BRAIN NATRIURETIC PEPTIDE: B NATRIURETIC PEPTIDE 5: 556.4 pg/mL — AB (ref 0.0–100.0)

## 2016-01-15 LAB — MRSA PCR SCREENING: MRSA BY PCR: POSITIVE — AB

## 2016-01-15 LAB — I-STAT TROPONIN, ED: TROPONIN I, POC: 0 ng/mL (ref 0.00–0.08)

## 2016-01-15 MED ORDER — FERROUS SULFATE 325 (65 FE) MG PO TABS
325.0000 mg | ORAL_TABLET | Freq: Every day | ORAL | Status: DC
  Administered 2016-01-16 – 2016-02-01 (×14): 325 mg via ORAL
  Filled 2016-01-15 (×15): qty 1

## 2016-01-15 MED ORDER — INSULIN GLARGINE 100 UNIT/ML ~~LOC~~ SOLN
30.0000 [IU] | Freq: Every day | SUBCUTANEOUS | Status: DC
  Administered 2016-01-15: 30 [IU] via SUBCUTANEOUS
  Filled 2016-01-15: qty 0.3

## 2016-01-15 MED ORDER — DEXTROSE 5 % IV SOLN: 500.0000 mg | Freq: Once | INTRAVENOUS | Status: DC

## 2016-01-15 MED ORDER — SENNOSIDES-DOCUSATE SODIUM 8.6-50 MG PO TABS
2.0000 | ORAL_TABLET | Freq: Every day | ORAL | Status: DC
  Administered 2016-01-15 – 2016-01-30 (×16): 2 via ORAL
  Filled 2016-01-15 (×16): qty 2

## 2016-01-15 MED ORDER — AMIODARONE HCL 200 MG PO TABS
200.0000 mg | ORAL_TABLET | Freq: Every day | ORAL | Status: DC
  Administered 2016-01-16 – 2016-01-17 (×2): 200 mg via ORAL
  Filled 2016-01-15 (×2): qty 1

## 2016-01-15 MED ORDER — VITAMIN D 1000 UNITS PO TABS
2000.0000 [IU] | ORAL_TABLET | Freq: Every day | ORAL | Status: DC
  Administered 2016-01-16 – 2016-02-01 (×16): 2000 [IU] via ORAL
  Filled 2016-01-15 (×16): qty 2

## 2016-01-15 MED ORDER — FUROSEMIDE 10 MG/ML IJ SOLN
40.0000 mg | Freq: Two times a day (BID) | INTRAMUSCULAR | Status: AC
  Administered 2016-01-15 – 2016-01-16 (×2): 40 mg via INTRAVENOUS
  Filled 2016-01-15 (×2): qty 4

## 2016-01-15 MED ORDER — DEXTROSE 5 % IV SOLN: 1.0000 g | Freq: Once | INTRAVENOUS | Status: DC

## 2016-01-15 MED ORDER — IPRATROPIUM-ALBUTEROL 0.5-2.5 (3) MG/3ML IN SOLN: 3.0000 mL | Freq: Four times a day (QID) | RESPIRATORY_TRACT | Status: DC

## 2016-01-15 MED ORDER — SODIUM CHLORIDE 0.9 % IV SOLN
250.0000 mL | INTRAVENOUS | Status: DC | PRN
  Administered 2016-01-17: 250 mL via INTRAVENOUS
  Administered 2016-01-28: 11:00:00 via INTRAVENOUS

## 2016-01-15 MED ORDER — AZITHROMYCIN 500 MG IV SOLR
500.0000 mg | INTRAVENOUS | Status: DC
  Administered 2016-01-15: 500 mg via INTRAVENOUS
  Filled 2016-01-15 (×2): qty 500

## 2016-01-15 MED ORDER — INSULIN ASPART 100 UNIT/ML ~~LOC~~ SOLN
0.0000 [IU] | Freq: Three times a day (TID) | SUBCUTANEOUS | Status: DC
  Administered 2016-01-16 (×2): 2 [IU] via SUBCUTANEOUS
  Administered 2016-01-16: 1 [IU] via SUBCUTANEOUS
  Administered 2016-01-17 – 2016-01-18 (×4): 2 [IU] via SUBCUTANEOUS
  Administered 2016-01-18: 3 [IU] via SUBCUTANEOUS
  Administered 2016-01-18 – 2016-01-19 (×2): 2 [IU] via SUBCUTANEOUS
  Administered 2016-01-19 – 2016-01-20 (×3): 3 [IU] via SUBCUTANEOUS
  Administered 2016-01-20: 5 [IU] via SUBCUTANEOUS
  Administered 2016-01-21: 7 [IU] via SUBCUTANEOUS

## 2016-01-15 MED ORDER — IPRATROPIUM-ALBUTEROL 0.5-2.5 (3) MG/3ML IN SOLN
RESPIRATORY_TRACT | Status: AC
  Filled 2016-01-15: qty 3

## 2016-01-15 MED ORDER — ALBUTEROL SULFATE (2.5 MG/3ML) 0.083% IN NEBU
2.5000 mg | INHALATION_SOLUTION | RESPIRATORY_TRACT | Status: AC
  Administered 2016-01-15: 2.5 mg via RESPIRATORY_TRACT

## 2016-01-15 MED ORDER — CEFTRIAXONE SODIUM 1 G IJ SOLR
1.0000 g | INTRAMUSCULAR | Status: DC
  Administered 2016-01-15: 1 g via INTRAVENOUS
  Filled 2016-01-15 (×2): qty 10

## 2016-01-15 MED ORDER — IPRATROPIUM-ALBUTEROL 0.5-2.5 (3) MG/3ML IN SOLN
3.0000 mL | RESPIRATORY_TRACT | Status: AC
  Administered 2016-01-15: 3 mL via RESPIRATORY_TRACT

## 2016-01-15 MED ORDER — FUROSEMIDE 10 MG/ML IJ SOLN
40.0000 mg | Freq: Once | INTRAMUSCULAR | Status: AC
  Administered 2016-01-15: 40 mg via INTRAVENOUS
  Filled 2016-01-15: qty 4

## 2016-01-15 MED ORDER — SODIUM CHLORIDE 0.9% FLUSH
3.0000 mL | Freq: Two times a day (BID) | INTRAVENOUS | Status: DC
  Administered 2016-01-15 – 2016-02-01 (×32): 3 mL via INTRAVENOUS

## 2016-01-15 MED ORDER — ACETAMINOPHEN 325 MG PO TABS
650.0000 mg | ORAL_TABLET | ORAL | Status: DC | PRN
  Administered 2016-01-16 – 2016-01-21 (×2): 650 mg via ORAL
  Filled 2016-01-15 (×2): qty 2

## 2016-01-15 MED ORDER — WARFARIN - PHARMACIST DOSING INPATIENT
Freq: Every day | Status: DC
  Administered 2016-01-15 – 2016-01-21 (×4)

## 2016-01-15 MED ORDER — SERTRALINE HCL 100 MG PO TABS
100.0000 mg | ORAL_TABLET | Freq: Every day | ORAL | Status: DC
  Administered 2016-01-16 – 2016-02-01 (×16): 100 mg via ORAL
  Filled 2016-01-15 (×16): qty 1

## 2016-01-15 MED ORDER — CHLORHEXIDINE GLUCONATE CLOTH 2 % EX PADS
6.0000 | MEDICATED_PAD | Freq: Every day | CUTANEOUS | Status: DC
  Administered 2016-01-16 – 2016-01-17 (×2): 6 via TOPICAL

## 2016-01-15 MED ORDER — DEXTROSE 50 % IV SOLN
25.0000 g | Freq: Once | INTRAVENOUS | Status: AC
  Administered 2016-01-15: 25 g via INTRAVENOUS
  Filled 2016-01-15: qty 50

## 2016-01-15 MED ORDER — ATORVASTATIN CALCIUM 40 MG PO TABS
40.0000 mg | ORAL_TABLET | Freq: Every day | ORAL | Status: DC
  Administered 2016-01-15 – 2016-01-31 (×17): 40 mg via ORAL
  Filled 2016-01-15 (×17): qty 1

## 2016-01-15 MED ORDER — SODIUM CHLORIDE 0.9% FLUSH: 3.0000 mL | INTRAVENOUS | Status: DC | PRN

## 2016-01-15 MED ORDER — PANTOPRAZOLE SODIUM 40 MG PO TBEC
40.0000 mg | DELAYED_RELEASE_TABLET | Freq: Every day | ORAL | Status: DC
  Administered 2016-01-16 – 2016-02-01 (×16): 40 mg via ORAL
  Filled 2016-01-15 (×16): qty 1

## 2016-01-15 MED ORDER — ONDANSETRON HCL 4 MG/2ML IJ SOLN: 4.0000 mg | Freq: Four times a day (QID) | INTRAMUSCULAR | Status: DC | PRN

## 2016-01-15 MED ORDER — FOLIC ACID 1 MG PO TABS
1.0000 mg | ORAL_TABLET | Freq: Every day | ORAL | Status: DC
  Administered 2016-01-16 – 2016-02-01 (×16): 1 mg via ORAL
  Filled 2016-01-15 (×16): qty 1

## 2016-01-15 MED ORDER — VITAMIN B-1 100 MG PO TABS
100.0000 mg | ORAL_TABLET | Freq: Every day | ORAL | Status: DC
  Administered 2016-01-16 – 2016-02-01 (×16): 100 mg via ORAL
  Filled 2016-01-15 (×16): qty 1

## 2016-01-15 MED ORDER — MUPIROCIN 2 % EX OINT
1.0000 "application " | TOPICAL_OINTMENT | Freq: Two times a day (BID) | CUTANEOUS | Status: AC
  Administered 2016-01-15 – 2016-01-20 (×10): 1 via NASAL
  Filled 2016-01-15 (×2): qty 22

## 2016-01-15 MED ORDER — INFLUENZA VAC SPLIT QUAD 0.5 ML IM SUSY
0.5000 mL | PREFILLED_SYRINGE | INTRAMUSCULAR | Status: AC
  Administered 2016-01-16: 0.5 mL via INTRAMUSCULAR
  Filled 2016-01-15: qty 0.5

## 2016-01-15 MED ORDER — IPRATROPIUM-ALBUTEROL 0.5-2.5 (3) MG/3ML IN SOLN
3.0000 mL | Freq: Three times a day (TID) | RESPIRATORY_TRACT | Status: DC
  Administered 2016-01-15 – 2016-01-16 (×4): 3 mL via RESPIRATORY_TRACT
  Filled 2016-01-15 (×4): qty 3

## 2016-01-15 MED ORDER — METOPROLOL TARTRATE 12.5 MG HALF TABLET
12.5000 mg | ORAL_TABLET | Freq: Two times a day (BID) | ORAL | Status: DC
  Administered 2016-01-15 – 2016-02-01 (×30): 12.5 mg via ORAL
  Filled 2016-01-15 (×32): qty 1

## 2016-01-15 MED ORDER — PENTOXIFYLLINE ER 400 MG PO TBCR
400.0000 mg | EXTENDED_RELEASE_TABLET | Freq: Two times a day (BID) | ORAL | Status: DC
  Administered 2016-01-15 – 2016-01-16 (×2): 400 mg via ORAL
  Filled 2016-01-15 (×2): qty 1

## 2016-01-15 MED ORDER — ALBUTEROL SULFATE (2.5 MG/3ML) 0.083% IN NEBU
INHALATION_SOLUTION | RESPIRATORY_TRACT | Status: AC
  Filled 2016-01-15: qty 3

## 2016-01-15 MED ORDER — LEVOTHYROXINE SODIUM 25 MCG PO TABS
25.0000 ug | ORAL_TABLET | Freq: Every day | ORAL | Status: DC
  Administered 2016-01-16 – 2016-01-17 (×2): 25 ug via ORAL
  Filled 2016-01-15 (×2): qty 1

## 2016-01-15 NOTE — ED Triage Notes (Signed)
Patient complains of increasing shortness of breath and abdominal distention since thanksgiving. Spouse states that patient had recent cardioversion. Alert and oriented but slow to answer questions.

## 2016-01-15 NOTE — Progress Notes (Signed)
MD made aware of critical INR (4.68).

## 2016-01-15 NOTE — Progress Notes (Signed)
Pt refuse CPAP for the night, RN aware at bedside

## 2016-01-15 NOTE — ED Provider Notes (Addendum)
[facility]-EMERGENCY DEPT Provider Note   CSN: [id number] Arrival date & time: 01/15/16  [id number]     History   Chief Complaint Chief Complaint  Patient presents with  . Abdominal Pain  . Shortness of Breath    HPI [name] is a 70 y.o. male.  Patient w hx afib, pacemaker, chf, crf, c/o increased sob, and bil leg and abd swelling in the past couple weeks.   Also upper resp congestion and non prod cough. +orthopnea. Cough episodic. Sob continuous/persistent, moderate, worse this week. No chest pain. No abd pain. No fever or chills. Denies sore throat or sinus pain/drainage. Compliant w normal meds.    The history is provided by the patient.    Past Medical History:  Diagnosis Date  . Cardiac pacemaker in situ 08/03/2012   Indications transient complete heart block Insertion 08/03/12 Dr. Graciela HusbandsKlein  LV lead Medtronic 5076 serial number [id number]  LA lead Medtronic 5076 serial number [id number] Medtronic MRI compatible pulse generator serial number [id number].       . Chronic diastolic heart failure (HCC)   . Chronic kidney disease (CKD), stage III (moderate) 08/02/2012  . 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 Hyperlipidemia   . Hypertensive heart disease   . Hypothyroidism   . 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 LBBB (left bundle branch block) 08/02/2012  . Obesity (BMI 30-39.9)     Patient Active Problem List   Diagnosis Date Noted  . 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 Atrial fibrillation (HCC) 11/09/2012  . Chronic diastolic heart failure (HCC)   . Hypothyroidism   . Hyperlipidemia   . Cardiac pacemaker in situ 08/03/2012  . Hypertensive heart disease   . Obesity (BMI 30-39.9)   . Chronic kidney disease (CKD), stage III (moderate) 08/02/2012  . LBBB (left bundle branch block) 08/02/2012  . DM (diabetes mellitus) with complications (HCC) 08/02/2012    Past Surgical History:  Procedure Laterality Date  .  CARDIOVERSION N/A 01/03/2016   Procedure: CARDIOVERSION;  Surgeon: Laurey Moralealton S McLean, MD;  [facility];  Service: Cardiovascular;  Laterality: N/A;  . PERMANENT PACEMAKER INSERTION N/A 08/03/2012   Procedure: PERMANENT PACEMAKER INSERTION;  Surgeon: Duke SalviaSteven C Klein, MD;  [facility];  Service: Cardiovascular;  Laterality: N/A;       Home Medications    Prior to Admission medications   Medication Sig Start Date End Date Taking? Authorizing Provider  albuterol-ipratropium (COMBIVENT) 18-103 MCG/ACT inhaler Inhale 1 puff into the lungs 4 (four) times daily.    Historical Provider, MD  amiodarone (PACERONE) 200 MG tablet 1 tablet daily. 11/13/15   Historical Provider, MD  amLODipine (NORVASC) 5 MG tablet Take 1 tablet (5 mg total) by mouth daily. 08/05/12   Othella BoyerWilliam S Tilley, MD  atorvastatin (LIPITOR) 80 MG tablet Take 40 mg by mouth at bedtime.    Historical Provider, MD  Cholecalciferol (VITAMIN D) 2000 UNITS CAPS Take 2,000 Units by mouth daily.    Historical Provider, MD  ferrous sulfate 325 (65 FE) MG tablet Take 325 mg by mouth 2 (two) times daily.    Historical Provider, MD  fish oil-omega-3 fatty acids 1000 MG capsule Take 1 g by mouth 2 (two) times daily.    Historical Provider, MD  folic acid (FOLVITE) 1 MG tablet Take 1 mg by mouth daily.    Historical Provider, MD  furosemide (LASIX) 40 MG tablet 1 tablet 2 (two) times daily. 11/13/15   Historical Provider,  MD  insulin aspart (NOVOLOG) 100 UNIT/ML injection Inject 20 Units into the skin 3 (three) times daily before meals.    Historical Provider, MD  insulin glargine (LANTUS) 100 UNIT/ML injection Inject 52 Units into the skin at bedtime.     Historical Provider, MD  ketotifen (ZADITOR) 0.025 % ophthalmic solution Place 1 drop into both eyes 2 (two) times daily as needed.    Historical Provider, MD  levothyroxine (SYNTHROID, LEVOTHROID) 25 MCG tablet Take 25 mcg by mouth every morning. Saturday and Sunday takes 2 tablets     Historical Provider, MD  metoprolol (LOPRESSOR) 50 MG tablet Take 50 mg by mouth 2 (two) times daily.    Historical Provider, MD  mirtazapine (REMERON) 15 MG tablet Take 15 mg by mouth at bedtime.    Historical Provider, MD  omeprazole (PRILOSEC) 20 MG capsule Take 20 mg by mouth daily.    Historical Provider, MD  pentoxifylline (TRENTAL) 400 MG CR tablet Take 400 mg by mouth 3 (three) times daily after meals.    Historical Provider, MD  sertraline (ZOLOFT) 100 MG tablet Take 100 mg by mouth daily.    Historical Provider, MD  thiamine (VITAMIN B-1) 100 MG tablet Take 100 mg by mouth daily.    Historical Provider, MD  warfarin (COUMADIN) 2.5 MG tablet  11/13/15   Historical Provider, MD  White Petrolatum-Mineral Oil (ARTIFICIAL TEARS) OINT ophthalmic ointment Place 1 application into both eyes every evening.    Historical Provider, MD    Family History No family history on file.  Social History Social History  Substance Use Topics  . Smoking status: Former Games developer  . Smokeless tobacco: Never Used  . Alcohol use No     Allergies   Lasix [furosemide]   Review of Systems Review of Systems  Constitutional: Negative for fever.  HENT: Negative for sore throat.   Eyes: Negative for redness.  Respiratory: Positive for cough and shortness of breath.   Cardiovascular: Positive for leg swelling. Negative for chest pain.  Gastrointestinal: Negative for abdominal pain.  Genitourinary: Negative for flank pain.  Musculoskeletal: Negative for back pain and neck pain.  Skin: Negative for rash.  Neurological: Negative for headaches.  Hematological: Does not bruise/bleed easily.  Psychiatric/Behavioral: Negative for confusion.     Physical Exam Updated Vital Signs BP 128/79 (BP Location: Left Arm)   Pulse 72   Temp 97.9 F (36.6 C) (Oral)   Resp 21   SpO2 94%   Physical Exam  Constitutional: He is oriented to person, place, and time. He appears well-developed and well-nourished. No  distress.  HENT:  Mouth/Throat: Oropharynx is clear and moist.  Eyes: Conjunctivae are normal.  Neck: Neck supple. No tracheal deviation present.  Cardiovascular: Normal rate, regular rhythm, normal heart sounds and intact distal pulses.   Pulmonary/Chest: Effort normal. No accessory muscle usage.  Rales bases.   Abdominal: Soft. Bowel sounds are normal. He exhibits no distension. There is no tenderness.  obese  Musculoskeletal: He exhibits edema.  Moderate edema to upper legs bil.   Neurological: He is alert and oriented to person, place, and time.  Skin: Skin is warm and dry. He is not diaphoretic.  Psychiatric: He has a normal mood and affect.  Nursing note and vitals reviewed.    ED Treatments / Results  Labs (all labs ordered are listed, but only abnormal results are displayed) Results for orders placed or performed during the hospital encounter of 01/15/16  CBC with Differential  Result Value Ref  Range   WBC 8.7 4.0 - 10.5 K/uL   RBC 3.08 (L) 4.22 - 5.81 MIL/uL   Hemoglobin 8.5 (L) 13.0 - 17.0 g/dL   HCT 74.2 (L) 59.5 - 63.8 %   MCV 90.9 78.0 - 100.0 fL   MCH 27.6 26.0 - 34.0 pg   MCHC 30.4 30.0 - 36.0 g/dL   RDW 75.6 (H) 43.3 - 29.5 %   Platelets 238 150 - 400 K/uL   Neutrophils Relative % 72 %   Neutro Abs 6.3 1.7 - 7.7 K/uL   Lymphocytes Relative 18 %   Lymphs Abs 1.5 0.7 - 4.0 K/uL   Monocytes Relative 7 %   Monocytes Absolute 0.6 0.1 - 1.0 K/uL   Eosinophils Relative 3 %   Eosinophils Absolute 0.3 0.0 - 0.7 K/uL   Basophils Relative 0 %   Basophils Absolute 0.0 0.0 - 0.1 K/uL  Comprehensive metabolic panel  Result Value Ref Range   Sodium 140 135 - 145 mmol/L   Potassium 3.9 3.5 - 5.1 mmol/L   Chloride 107 101 - 111 mmol/L   CO2 22 22 - 32 mmol/L   Glucose, Bld 47 (L) 65 - 99 mg/dL   BUN 64 (H) 6 - 20 mg/dL   Creatinine, Ser 1.88 (H) 0.61 - 1.24 mg/dL   Calcium 9.3 8.9 - 41.6 mg/dL   Total Protein 7.6 6.5 - 8.1 g/dL   Albumin 2.9 (L) 3.5 - 5.0 g/dL    AST 17 15 - 41 U/L   ALT 13 (L) 17 - 63 U/L   Alkaline Phosphatase 82 38 - 126 U/L   Total Bilirubin 0.6 0.3 - 1.2 mg/dL   GFR calc non Af Amer 16 (L) >60 mL/min   GFR calc Af Amer 18 (L) >60 mL/min   Anion gap 11 5 - 15  Brain natriuretic peptide  Result Value Ref Range   B Natriuretic Peptide 556.4 (H) 0.0 - 100.0 pg/mL  I-Stat Troponin, ED (not at [facility])  Result Value Ref Range   Troponin i, poc 0.00 0.00 - 0.08 ng/mL   Comment 3           Dg Chest 2 View  Result Date: 01/15/2016 CLINICAL DATA:  Shortness of Breath EXAM: CHEST  2 VIEW COMPARISON:  November 14, 2015 FINDINGS: There is airspace consolidation in the anterior segment of the right upper lobe consistent with pneumonia. There is scarring in the left base region. Heart is borderline enlarged with pulmonary vascularity within normal limits. There is atherosclerotic calcification in the aorta. No adenopathy. Pacemaker leads are attached to the right atrium and right ventricle. Bony structures appear intact. IMPRESSION: Anterior segment right upper lobe airspace consolidation consistent with pneumonia. Stable scarring left base. Stable cardiac silhouette. There is aortic atherosclerosis. Followup PA and lateral chest radiographs recommended in 3-4 weeks following trial of antibiotic therapy to ensure resolution and exclude underlying malignancy. Electronically Signed   By: Bretta Bang III M.D.   On: 01/15/2016 09:23    EKG  EKG Interpretation  Date/Time:  Tuesday January 15 2016 08:46:15 EST Ventricular Rate:  61 PR Interval:    QRS Duration: 166 QT Interval:  514 QTC Calculation: 517 R Axis:   -115 Text Interpretation:  Electronic ventricular pacemaker Confirmed by Denton Lank  MD, Caryn Bee ([id number]) on 01/15/2016 9:35:03 AM       Radiology Dg Chest 2 View  Result Date: 01/15/2016 CLINICAL DATA:  Shortness of Breath EXAM: CHEST  2 VIEW COMPARISON:  November 14, 2015  FINDINGS: There is airspace consolidation in the  anterior segment of the right upper lobe consistent with pneumonia. There is scarring in the left base region. Heart is borderline enlarged with pulmonary vascularity within normal limits. There is atherosclerotic calcification in the aorta. No adenopathy. Pacemaker leads are attached to the right atrium and right ventricle. Bony structures appear intact. IMPRESSION: Anterior segment right upper lobe airspace consolidation consistent with pneumonia. Stable scarring left base. Stable cardiac silhouette. There is aortic atherosclerosis. Followup PA and lateral chest radiographs recommended in 3-4 weeks following trial of antibiotic therapy to ensure resolution and exclude underlying malignancy. Electronically Signed   By: Bretta BangWilliam  Woodruff III M.D.   On: 01/15/2016 09:23    Procedures Procedures (including critical care time)  Medications Ordered in ED Medications  furosemide (LASIX) injection 40 mg (not administered)  dextrose 50 % solution 25 g (not administered)     Initial Impression / Assessment and Plan / ED Course  I have reviewed the triage vital signs and the nursing notes.  Pertinent labs & imaging results that were available during my care of the patient were reviewed by me and considered in my medical decision making (see chart for details).  Clinical Course     Iv ns. Continuous pulse ox and monitor. o2 [place].  Labs. Cxr. Ecg.  cxr with vascular congestion, and possible pna.  Will cx and rx. Lasix iv.   Glucose low, d50, po fluids/meal.   With chronic anemia, and chf, crf, feel patient may benefit from prbc transfusion and diuresis, as well as tx pna.   Medical service consulted for admission re chf, possible pna, worsening renal insuff, edema.   Discussed with [facility] - they request temp orders, tele, obs, to Dr Cyndie ChimeGranfortuna.     Final Clinical Impressions(s) / ED Diagnoses   Final diagnoses:  None    New Prescriptions New Prescriptions   No medications on file         Cathren LaineKevin Jarelyn Bambach, MD 01/15/16 1128

## 2016-01-15 NOTE — Care Management Note (Signed)
Case Management Note  Patient Details  Name: Zadie CleverlyMichael D Holsopple MRN: [id number] Date of Birth: 12/13/1945  Subjective/Objective:                  70 year old man with history of chronic diastolic heart failure, pacemaker for complete heart block, CKD3, DM2, HLD, Hypothyroidism presenting dyspnea and abdominal pain. /From home with spouse.  Action/Plan: Follow for disposition needs.   Expected Discharge Date:  01/17/16               Expected Discharge Plan:  Home w Home Health Services  In-House Referral:  NA  Discharge planning Services  CM Consult  Post Acute Care Choice:    Choice offered to:     DME Arranged:    DME Agency:     HH Arranged:    HH Agency:     Status of Service:  In process, will continue to follow  If discussed at Long Length of Stay Meetings, dates discussed:    Additional Comments:  Oletta CohnWood, Farhana Fellows, RN 01/15/2016, 2:10 PM

## 2016-01-15 NOTE — ED Notes (Addendum)
Notified Anna Banker(RN) of pt b/p, re-evaluated. Pt b/p improved.

## 2016-01-15 NOTE — ED Notes (Signed)
Patient is in xray at this time per Johnston EbbsKoula, RN (Nurse first) and will have them to bring him back to room A10 when complete; wife is in room at beside waiting; Tobi BastosAnna, RN aware

## 2016-01-15 NOTE — ED Notes (Signed)
Pt CBG was 110 notified Anna Banker(RN)

## 2016-01-15 NOTE — Progress Notes (Signed)
ANTICOAGULATION CONSULT NOTE - Initial Consult  Pharmacy Consult for warfarin Indication: atrial fibrillation  Allergies  Allergen Reactions  . Lasix [Furosemide]     6 years ago. Allergy, patient can take now    Patient Measurements: Height: 5\' 11"  (180.3 cm) Weight: 274 lb 11.2 oz (124.6 kg) IBW/kg (Calculated) : 75.3  Vital Signs: Temp: 97.8 F (36.6 C) (11/28 1554) Temp Source: Oral (11/28 1554) BP: 125/74 (11/28 1554) Pulse Rate: 63 (11/28 1554)  Labs:  Recent Labs  01/15/16 0849 01/15/16 1528  HGB 8.5*  --   HCT 28.0*  --   PLT 238  --   LABPROT  --  45.4*  INR  --  4.68*  CREATININE 3.58*  --   TROPONINI  --  <0.03   Estimated Creatinine Clearance: 25.8 mL/min (by C-G formula based on SCr of 3.58 mg/dL (H)).  Medical History: Past Medical History:  Diagnosis Date  . Cardiac pacemaker in situ 08/03/2012   Indications transient complete heart block Insertion 08/03/12 Dr. Graciela HusbandsKlein  LV lead Medtronic 5076 serial number [id number]  LA lead Medtronic 5076 serial number [id number] Medtronic MRI compatible pulse generator serial number [id number].       . Chronic diastolic heart failure (HCC)   . Chronic kidney disease (CKD), stage III (moderate) 08/02/2012  . 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 Hyperlipidemia   . Hypertensive heart disease   . Hypothyroidism   . 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 LBBB (left bundle branch block) 08/02/2012  . Obesity (BMI 30-39.9)    Medications:  Prescriptions Prior to Admission  Medication Sig Dispense Refill Last Dose  . albuterol-ipratropium (COMBIVENT) 18-103 MCG/ACT inhaler Inhale 1 puff into the lungs 4 (four) times daily.   Past Week at Unknown time  . amiodarone (PACERONE) 200 MG tablet Take 200 mg by mouth daily.    01/15/2016 at Unknown time  . amLODipine (NORVASC) 5 MG tablet Take 1 tablet (5 mg total) by mouth daily. 30 tablet 12 01/15/2016 at Unknown time  . atorvastatin  (LIPITOR) 80 MG tablet Take 40 mg by mouth at bedtime.   01/14/2016 at Unknown time  . Cholecalciferol (VITAMIN D) 2000 UNITS CAPS Take 2,000 Units by mouth daily.   01/14/2016 at Unknown time  . ferrous sulfate 325 (65 FE) MG tablet Take 325 mg by mouth 2 (two) times daily.   01/15/2016 at Unknown time  . fish oil-omega-3 fatty acids 1000 MG capsule Take 1 g by mouth 2 (two) times daily.   01/14/2016 at Unknown time  . folic acid (FOLVITE) 1 MG tablet Take 1 mg by mouth daily.   01/14/2016 at Unknown time  . furosemide (LASIX) 40 MG tablet 1 tablet 2 (two) times daily.   01/15/2016 at Unknown time  . insulin aspart (NOVOLOG) 100 UNIT/ML injection Inject 20 Units into the skin 3 (three) times daily before meals.   01/14/2016 at Unknown time  . insulin glargine (LANTUS) 100 UNIT/ML injection Inject 52 Units into the skin at bedtime.    01/14/2016 at Unknown time  . ketotifen (ZADITOR) 0.025 % ophthalmic solution Place 1 drop into both eyes 2 (two) times daily as needed.   Past Week at Unknown time  . levothyroxine (SYNTHROID, LEVOTHROID) 25 MCG tablet Take 25 mcg by mouth every morning. Saturday and Sunday takes 2 tablets   01/15/2016 at Unknown time  . metoprolol (LOPRESSOR) 50 MG tablet Take 50 mg by mouth 2 (two) times daily.   01/15/2016  at 6a  . mirtazapine (REMERON) 15 MG tablet Take 15 mg by mouth at bedtime.   01/14/2016 at Unknown time  . omeprazole (PRILOSEC) 20 MG capsule Take 20 mg by mouth daily.   01/15/2016 at Unknown time  . pentoxifylline (TRENTAL) 400 MG CR tablet Take 400 mg by mouth 2 (two) times daily.    01/15/2016 at Unknown time  . sertraline (ZOLOFT) 100 MG tablet Take 100 mg by mouth daily.   01/15/2016 at Unknown time  . thiamine (VITAMIN B-1) 100 MG tablet Take 100 mg by mouth daily.   01/14/2016 at Unknown time  . warfarin (COUMADIN) 2.5 MG tablet Take 2.5 mg by mouth one time only at 6 PM. Tuesday, Wednesday,thursday,saturday,sunday   01/13/2016 at 6p  . White  Petrolatum-Mineral Oil (ARTIFICIAL TEARS) OINT ophthalmic ointment Place 1 application into both eyes every evening.   01/15/2016 at Unknown time    Assessment: 70 year old man with complex medical history on warfarin PTA for atrial fibrillation. INR 4.68 supratherapeutic at time of admission. HgB 8.5 and reports dark tarry stool attributed to iron supplementation. Will hold warfarin and resume once INR < 3  Goal of Therapy:  INR 2-3 Monitor platelets by anticoagulation protocol: Yes   Plan:  Hold warfarin for INR 4.68 Daily INR and CBC Monitor s/sx of bleeding  Nahjae Hoeg L Daniela Siebers 01/15/2016,5:55 PM

## 2016-01-15 NOTE — H&P (Signed)
Date: 01/15/2016               Patient Name:  [name] MRN: [id number]  DOB: Nov 30, 1945 Age / Sex: 70 y.o., male   PCP: Annamaria Bootsenu Bale, MD         Medical Service: Internal Medicine Teaching Service         Attending Physician: Dr. Levert FeinsteinJames M Granfortuna, MD    First Contact: Dr. Laural BenesJohnson Pager: [phone]  Second Contact: Dr. Johnny BridgeSaraiya Pager: [phone]       After Hours (After 5p/  First Contact Pager: [phone]  weekends / holidays): Second Contact Pager: [phone]   Chief Complaint: abdominal tightness and dyspnea  History of Present Illness: 70 year old man with history of chronic diastolic heart failure, pacemaker for complete heart block, CKD3, DM2, HLD, Hypothyroidism presenting dyspnea and abdominal pain. He has been feeling poorly all of October. They attributed it to amiodarone. The dose was decreased October 27. He had epigastric abdominal pain that started prior to this. He had a cardioversion on 11/16. His abdominal pain has been worsening. He has been having dyspnea since before the abdominal pain but has been worsening. He has orthopnea and has been sleeping on a wedge with pillows on it. He has been using his CPAP machine. He has PND. Denies chest pain. He has been having diaphoresis.  The abdominal pain is across the top of his abdomen with a little more on left than right. Does not radiate. Intermittent. Cramping in nature. Does not correlate with meals. Worse with standing up and walking. Sitting down and resting makes it better. His last BM was Thanksgiving day and the day after. He always has dark tarry stools due to iron supplementation. Last colonoscopy was 2 years ago - a few polyps were removed. He had an EGD several years ago as well as capsule endoscopy.   No fevers. No vision changes. He has some cough that is productive of white/clear mucous. No nausea or vomiting. No dysuria or hematuria. No rash. He has some bruises on his arms. +LE edema. No arthralgias or  myalgias.   Meds:  Current Meds  Medication Sig  . albuterol-ipratropium (COMBIVENT) 18-103 MCG/ACT inhaler Inhale 1 puff into the lungs 4 (four) times daily.  Marland Kitchen. amiodarone (PACERONE) 200 MG tablet Take 200 mg by mouth daily.   Marland Kitchen. amLODipine (NORVASC) 5 MG tablet Take 1 tablet (5 mg total) by mouth daily.  Marland Kitchen. atorvastatin (LIPITOR) 80 MG tablet Take 40 mg by mouth at bedtime.  . Cholecalciferol (VITAMIN D) 2000 UNITS CAPS Take 2,000 Units by mouth daily.  . ferrous sulfate 325 (65 FE) MG tablet Take 325 mg by mouth 2 (two) times daily.  . fish oil-omega-3 fatty acids 1000 MG capsule Take 1 g by mouth 2 (two) times daily.  . folic acid (FOLVITE) 1 MG tablet Take 1 mg by mouth daily.  . furosemide (LASIX) 40 MG tablet 1 tablet 2 (two) times daily.  . insulin aspart (NOVOLOG) 100 UNIT/ML injection Inject 20 Units into the skin 3 (three) times daily before meals.  . insulin glargine (LANTUS) 100 UNIT/ML injection Inject 52 Units into the skin at bedtime.   Marland Kitchen. ketotifen (ZADITOR) 0.025 % ophthalmic solution Place 1 drop into both eyes 2 (two) times daily as needed.  Marland Kitchen. levothyroxine (SYNTHROID, LEVOTHROID) 25 MCG tablet Take 25 mcg by mouth every morning. Saturday and Sunday takes 2 tablets  . metoprolol (LOPRESSOR) 50 MG tablet Take 50 mg by mouth 2 (  two) times daily.  . mirtazapine (REMERON) 15 MG tablet Take 15 mg by mouth at bedtime.  Marland Kitchen. omeprazole (PRILOSEC) 20 MG capsule Take 20 mg by mouth daily.  . pentoxifylline (TRENTAL) 400 MG CR tablet Take 400 mg by mouth 2 (two) times daily.   . sertraline (ZOLOFT) 100 MG tablet Take 100 mg by mouth daily.  Marland Kitchen. thiamine (VITAMIN B-1) 100 MG tablet Take 100 mg by mouth daily.  Marland Kitchen. warfarin (COUMADIN) 2.5 MG tablet Take 2.5 mg by mouth one time only at 6 PM. Tuesday, Wednesday,thursday,saturday,sunday  . White Petrolatum-Mineral Oil (ARTIFICIAL TEARS) OINT ophthalmic ointment Place 1 application into both eyes every evening.     Allergies: Allergies as of  01/15/2016 - Review Complete 01/15/2016  Allergen Reaction Noted  . Lasix [furosemide]  05/31/2011   Past Medical History:  Diagnosis Date  . Cardiac pacemaker in situ 08/03/2012   Indications transient complete heart block Insertion 08/03/12 Dr. Graciela HusbandsKlein  LV lead Medtronic 5076 serial number [id number]  LA lead Medtronic 5076 serial number [id number] Medtronic MRI compatible pulse generator serial number [id number].       . Chronic diastolic heart failure (HCC)   . Chronic kidney disease (CKD), stage III (moderate) 08/02/2012  . 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 Hyperlipidemia   . Hypertensive heart disease   . Hypothyroidism   . 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 LBBB (left bundle branch block) 08/02/2012  . Obesity (BMI 30-39.9)     Family History:  Mother had lung cancer Father had prostate cancer No family history of heart disease  Social History:  Quit tobacco 6 years ago. Smoked for 50 years with average of 2 ppd. Quit alcohol 6 years ago. No illicits.  Review of Systems: A complete ROS was negative except as per HPI.   Physical Exam: Blood pressure 124/75, pulse (!) 56, temperature 97.9 F (36.6 C), temperature source Oral, resp. rate 19, SpO2 96 %. General Apperance: mild respiratory distress Head: Normocephalic, atraumatic Eyes: PERRL, EOMI, anicteric sclera Ears: Normal external ear canal Nose: Nares normal, septum midline, mucosa normal Throat: Lips, mucosa and tongue normal  Neck: Supple, trachea midline Back: No tenderness or bony abnormality  Lungs: Crackles throughout. No wheezes Chest Wall: Nontender, no deformity Heart: Regular rate and rhythm, no murmur/rub/gallop Abdomen: Mild tightness but soft, nontender to palpation, no rebound/guarding Extremities: Normal, atraumatic, warm and well perfused, 2+ BLE edema Skin: No rashes or lesions Neurologic: Alert and oriented x 3. CNII-XII intact. Normal strength and  sensation   EKG: Unable to visualize p waves but appears to be a regular rhythm, ventricularly paced, unchanged from previous EKG.  CXR: Anterior segment right upper lobe airspace consolidation.  Bedside U/S: Performed bedside ultrasound of abdomen with no ascites identified.  Assessment & Plan by Problem: 70 year old man with history of chronic diastolic heart failure, pacemaker for complete heart block, CKD3, DM2, HLD, Hypothyroidism presenting dyspnea and abdominal pain.  Dyspnea and abdominal tightness: History of chronic diastolic heart failure. He had grade 3 diastolic dysfunction and EF of 50% on Echo October 2017. BNP 556.4. LFTs unremarkable. Troponin POC negative. Consistent with acute on chronic diastolic heart failure. He is afebrile with no leukocytosis. CXR concerning for right upper lobe consolidation. -Trend troponins and repeat EKG in AM -Lasix 40mg  IV BID -Strict in/out -Daily weight -Continue ceftriaxone and azithromycin -Follow up blood cultures -Check procalcitonin -May consider trial of BiPAP  AKI on CKD4: BUN 64 and creatinine 3.58 on admission.  Previously creatinine was around 3 in early November. -Continue to trend  Paroxysmal atrial fibrillation: He underwent cardioversion with subsequent sinus rhythm on 11/16. Rhythm appears unchanged today. -Continue home amiodarone 200mg  daily -Continue coumadin  DM2: Glucose 47 on presentation. On repeat measurement was 110. A1c 7.1 in 2014.  -On 52u of Lantus QHS. Will continue on 30u daily for now. -SSI -Check A1c.   Anemia of chronic disease: Hgb 8.5 on admission with normal MCV. Baseline is around 9. -Continue home ferrous sulfate  HTN: Currently normotensive -reduce home metoprolol to 12.5mg  BID -hold home amlodipine  HLD: continue home atorvastatin  Hypothyroidism: Last TSH 8.141 on 11/9 -Continue home Synthroid -Check TSH  OSA: Continue CPAP at night  Depression: Continue home Zoloft. Hold  off on home Remeron for now  FEN: HH VTE ppx: Warfarin Code Status: FULL  Dispo: Admit patient to Observation with expected length of stay less than 2 midnights.  Signed: Lora Paula, MD 01/15/2016, 11:22 AM  Pager: [phone]

## 2016-01-15 NOTE — ED Notes (Signed)
Attempted report x1. 

## 2016-01-16 DIAGNOSIS — G4733 Obstructive sleep apnea (adult) (pediatric): Secondary | ICD-10-CM

## 2016-01-16 DIAGNOSIS — D631 Anemia in chronic kidney disease: Secondary | ICD-10-CM | POA: Diagnosis present

## 2016-01-16 DIAGNOSIS — E039 Hypothyroidism, unspecified: Secondary | ICD-10-CM | POA: Diagnosis present

## 2016-01-16 DIAGNOSIS — N186 End stage renal disease: Secondary | ICD-10-CM | POA: Diagnosis present

## 2016-01-16 DIAGNOSIS — Z87891 Personal history of nicotine dependence: Secondary | ICD-10-CM

## 2016-01-16 DIAGNOSIS — F329 Major depressive disorder, single episode, unspecified: Secondary | ICD-10-CM

## 2016-01-16 DIAGNOSIS — N184 Chronic kidney disease, stage 4 (severe): Secondary | ICD-10-CM | POA: Diagnosis not present

## 2016-01-16 DIAGNOSIS — L8990 Pressure ulcer of unspecified site, unspecified stage: Secondary | ICD-10-CM | POA: Insufficient documentation

## 2016-01-16 DIAGNOSIS — I252 Old myocardial infarction: Secondary | ICD-10-CM | POA: Diagnosis not present

## 2016-01-16 DIAGNOSIS — E1151 Type 2 diabetes mellitus with diabetic peripheral angiopathy without gangrene: Secondary | ICD-10-CM

## 2016-01-16 DIAGNOSIS — Z7901 Long term (current) use of anticoagulants: Secondary | ICD-10-CM

## 2016-01-16 DIAGNOSIS — R319 Hematuria, unspecified: Secondary | ICD-10-CM | POA: Diagnosis present

## 2016-01-16 DIAGNOSIS — Z79899 Other long term (current) drug therapy: Secondary | ICD-10-CM

## 2016-01-16 DIAGNOSIS — I481 Persistent atrial fibrillation: Secondary | ICD-10-CM

## 2016-01-16 DIAGNOSIS — N189 Chronic kidney disease, unspecified: Secondary | ICD-10-CM | POA: Diagnosis not present

## 2016-01-16 DIAGNOSIS — J449 Chronic obstructive pulmonary disease, unspecified: Secondary | ICD-10-CM | POA: Diagnosis not present

## 2016-01-16 DIAGNOSIS — Z888 Allergy status to other drugs, medicaments and biological substances status: Secondary | ICD-10-CM

## 2016-01-16 DIAGNOSIS — I480 Paroxysmal atrial fibrillation: Secondary | ICD-10-CM

## 2016-01-16 DIAGNOSIS — R791 Abnormal coagulation profile: Secondary | ICD-10-CM | POA: Diagnosis present

## 2016-01-16 DIAGNOSIS — E785 Hyperlipidemia, unspecified: Secondary | ICD-10-CM | POA: Diagnosis present

## 2016-01-16 DIAGNOSIS — Z8679 Personal history of other diseases of the circulatory system: Secondary | ICD-10-CM | POA: Diagnosis not present

## 2016-01-16 DIAGNOSIS — I130 Hypertensive heart and chronic kidney disease with heart failure and stage 1 through stage 4 chronic kidney disease, or unspecified chronic kidney disease: Secondary | ICD-10-CM | POA: Diagnosis not present

## 2016-01-16 DIAGNOSIS — I5033 Acute on chronic diastolic (congestive) heart failure: Secondary | ICD-10-CM | POA: Diagnosis present

## 2016-01-16 DIAGNOSIS — E1140 Type 2 diabetes mellitus with diabetic neuropathy, unspecified: Secondary | ICD-10-CM | POA: Diagnosis present

## 2016-01-16 DIAGNOSIS — Z950 Presence of cardiac pacemaker: Secondary | ICD-10-CM

## 2016-01-16 DIAGNOSIS — N2581 Secondary hyperparathyroidism of renal origin: Secondary | ICD-10-CM | POA: Diagnosis present

## 2016-01-16 DIAGNOSIS — I132 Hypertensive heart and chronic kidney disease with heart failure and with stage 5 chronic kidney disease, or end stage renal disease: Secondary | ICD-10-CM | POA: Diagnosis present

## 2016-01-16 DIAGNOSIS — J181 Lobar pneumonia, unspecified organism: Secondary | ICD-10-CM

## 2016-01-16 DIAGNOSIS — E1122 Type 2 diabetes mellitus with diabetic chronic kidney disease: Secondary | ICD-10-CM | POA: Diagnosis present

## 2016-01-16 DIAGNOSIS — E1121 Type 2 diabetes mellitus with diabetic nephropathy: Secondary | ICD-10-CM | POA: Diagnosis present

## 2016-01-16 DIAGNOSIS — N179 Acute kidney failure, unspecified: Secondary | ICD-10-CM | POA: Diagnosis not present

## 2016-01-16 DIAGNOSIS — I482 Chronic atrial fibrillation: Secondary | ICD-10-CM | POA: Diagnosis not present

## 2016-01-16 DIAGNOSIS — Z801 Family history of malignant neoplasm of trachea, bronchus and lung: Secondary | ICD-10-CM

## 2016-01-16 DIAGNOSIS — Z9989 Dependence on other enabling machines and devices: Secondary | ICD-10-CM

## 2016-01-16 DIAGNOSIS — E1165 Type 2 diabetes mellitus with hyperglycemia: Secondary | ICD-10-CM | POA: Diagnosis not present

## 2016-01-16 DIAGNOSIS — J189 Pneumonia, unspecified organism: Secondary | ICD-10-CM

## 2016-01-16 DIAGNOSIS — J440 Chronic obstructive pulmonary disease with acute lower respiratory infection: Secondary | ICD-10-CM | POA: Diagnosis present

## 2016-01-16 DIAGNOSIS — Z23 Encounter for immunization: Secondary | ICD-10-CM | POA: Diagnosis present

## 2016-01-16 DIAGNOSIS — R34 Anuria and oliguria: Secondary | ICD-10-CM | POA: Diagnosis present

## 2016-01-16 DIAGNOSIS — Z8042 Family history of malignant neoplasm of prostate: Secondary | ICD-10-CM

## 2016-01-16 DIAGNOSIS — Z794 Long term (current) use of insulin: Secondary | ICD-10-CM

## 2016-01-16 DIAGNOSIS — E11649 Type 2 diabetes mellitus with hypoglycemia without coma: Secondary | ICD-10-CM | POA: Diagnosis not present

## 2016-01-16 DIAGNOSIS — N170 Acute kidney failure with tubular necrosis: Secondary | ICD-10-CM | POA: Diagnosis present

## 2016-01-16 LAB — BLOOD GAS, ARTERIAL
ACID-BASE DEFICIT: 1.8 mmol/L (ref 0.0–2.0)
Bicarbonate: 22.7 mmol/L (ref 20.0–28.0)
Delivery systems: POSITIVE
Expiratory PAP: 5
FIO2: 0.4
Inspiratory PAP: 10
MODE: POSITIVE
O2 SAT: 98 %
PATIENT TEMPERATURE: 98.6
PCO2 ART: 40.4 mmHg (ref 32.0–48.0)
PH ART: 7.368 (ref 7.350–7.450)
PO2 ART: 120 mmHg — AB (ref 83.0–108.0)

## 2016-01-16 LAB — BASIC METABOLIC PANEL
Anion gap: 11 (ref 5–15)
BUN: 69 mg/dL — AB (ref 6–20)
CHLORIDE: 107 mmol/L (ref 101–111)
CO2: 23 mmol/L (ref 22–32)
CREATININE: 3.73 mg/dL — AB (ref 0.61–1.24)
Calcium: 9.1 mg/dL (ref 8.9–10.3)
GFR calc non Af Amer: 15 mL/min — ABNORMAL LOW (ref >60)
GFR, EST AFRICAN AMERICAN: 18 mL/min — AB (ref >60)
GLUCOSE: 43 mg/dL — AB (ref 65–99)
Potassium: 4 mmol/L (ref 3.5–5.1)
Sodium: 141 mmol/L (ref 135–145)

## 2016-01-16 LAB — PROTIME-INR
INR: 5.19 — AB
Prothrombin Time: 49.3 seconds — ABNORMAL HIGH (ref 11.4–15.2)

## 2016-01-16 LAB — GLUCOSE, CAPILLARY
GLUCOSE-CAPILLARY: 124 mg/dL — AB (ref 65–99)
GLUCOSE-CAPILLARY: 176 mg/dL — AB (ref 65–99)
Glucose-Capillary: 41 mg/dL — CL (ref 65–99)
Glucose-Capillary: 56 mg/dL — ABNORMAL LOW (ref 65–99)
Glucose-Capillary: 137 mg/dL — ABNORMAL HIGH (ref 65–99)
Glucose-Capillary: 167 mg/dL — ABNORMAL HIGH (ref 65–99)

## 2016-01-16 LAB — HIV ANTIBODY (ROUTINE TESTING W REFLEX): HIV Screen 4th Generation wRfx: NONREACTIVE

## 2016-01-16 MED ORDER — DEXTROSE 50 % IV SOLN
INTRAVENOUS | Status: AC
  Filled 2016-01-16: qty 50

## 2016-01-16 MED ORDER — ORAL CARE MOUTH RINSE
15.0000 mL | Freq: Two times a day (BID) | OROMUCOSAL | Status: DC
  Administered 2016-01-16 – 2016-01-19 (×3): 15 mL via OROMUCOSAL

## 2016-01-16 MED ORDER — IPRATROPIUM-ALBUTEROL 0.5-2.5 (3) MG/3ML IN SOLN
3.0000 mL | Freq: Four times a day (QID) | RESPIRATORY_TRACT | Status: DC
  Administered 2016-01-16 – 2016-01-20 (×15): 3 mL via RESPIRATORY_TRACT
  Filled 2016-01-16 (×16): qty 3

## 2016-01-16 MED ORDER — FUROSEMIDE 10 MG/ML IJ SOLN
80.0000 mg | Freq: Once | INTRAMUSCULAR | Status: AC
  Administered 2016-01-16: 80 mg via INTRAVENOUS
  Filled 2016-01-16: qty 8

## 2016-01-16 MED ORDER — CHLORHEXIDINE GLUCONATE 0.12 % MT SOLN
15.0000 mL | Freq: Two times a day (BID) | OROMUCOSAL | Status: DC
  Administered 2016-01-16 – 2016-01-19 (×3): 15 mL via OROMUCOSAL
  Filled 2016-01-16 (×3): qty 15

## 2016-01-16 MED ORDER — DEXTROSE 50 % IV SOLN
1.0000 | Freq: Once | INTRAVENOUS | Status: AC
  Administered 2016-01-16: 50 mL via INTRAVENOUS

## 2016-01-16 MED ORDER — DEXTROSE 5 % IV SOLN
5.0000 mg | Freq: Once | INTRAVENOUS | Status: AC
  Administered 2016-01-16: 5 mg via INTRAVENOUS
  Filled 2016-01-16: qty 0.5

## 2016-01-16 MED ORDER — AZITHROMYCIN 250 MG PO TABS
250.0000 mg | ORAL_TABLET | Freq: Every day | ORAL | Status: DC
  Administered 2016-01-16 – 2016-01-18 (×3): 250 mg via ORAL
  Filled 2016-01-16 (×3): qty 1

## 2016-01-16 MED ORDER — FUROSEMIDE 10 MG/ML IJ SOLN
160.0000 mg | Freq: Two times a day (BID) | INTRAVENOUS | Status: DC
  Administered 2016-01-16 – 2016-01-17 (×3): 160 mg via INTRAVENOUS
  Filled 2016-01-16 (×4): qty 16

## 2016-01-16 NOTE — Progress Notes (Addendum)
Order to transfer patient to stepdown. Per patient placement, no stepdown beds at this time. Rapid Response RN, Attending MD, and Respiratory Therapist notified and aware.

## 2016-01-16 NOTE — Significant Event (Signed)
Rapid Response Event Note  Overview: Time Called: 1120 Arrival Time: 1120 Event Type: Respiratory  Initial Focused Assessment: Seen earlier today, plan transfer patient to SDU for bipap. Patient still easily conversant but falls asleep easily. Lung sound clear but tight  Heart tones irregular.  AF 70s  Interventions: 1540 Placed on Bipap 1555  ABG done Patient resting comfortably on bipap Transferred to 4np05  Plan of Care (if not transferred):  Event Summary:   at      at          Ocige Incayton, Chritopher Coster

## 2016-01-16 NOTE — Progress Notes (Signed)
ANTICOAGULATION CONSULT NOTE  Pharmacy Consult for warfarin Indication: atrial fibrillation  Allergies  Allergen Reactions  . Lasix [Furosemide]     6 years ago. Allergy, patient can take now    Patient Measurements: Height: 5\' 11"  (180.3 cm) Weight: 274 lb 3.2 oz (124.4 kg) (scale a) IBW/kg (Calculated) : 75.3  Vital Signs: Temp: 98.5 F (36.9 C) (11/29 0510) Temp Source: Oral (11/29 0510) BP: 160/60 (11/29 0510) Pulse Rate: 62 (11/29 0510)  Labs:  Recent Labs  01/15/16 0849 01/15/16 1528 01/15/16 2040 01/16/16 0511  HGB 8.5*  --   --   --   HCT 28.0*  --   --   --   PLT 238  --   --   --   LABPROT  --  45.4*  --  49.3*  INR  --  4.68*  --  5.19*  CREATININE 3.58*  --   --  3.73*  TROPONINI  --  <0.03 <0.03  --    Estimated Creatinine Clearance: 24.7 mL/min (by C-G formula based on SCr of 3.73 mg/dL (H)).  Medical History: Past Medical History:  Diagnosis Date  . Cardiac pacemaker in situ 08/03/2012   Indications transient complete heart block Insertion 08/03/12 Dr. Graciela HusbandsKlein  LV lead Medtronic 5076 serial number [id number]  LA lead Medtronic 5076 serial number [id number] Medtronic MRI compatible pulse generator serial number [id number].       . Chronic diastolic heart failure (HCC)   . Chronic kidney disease (CKD), stage III (moderate) 08/02/2012  . 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 Hyperlipidemia   . Hypertensive heart disease   . Hypothyroidism   . 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 LBBB (left bundle branch block) 08/02/2012  . Obesity (BMI 30-39.9)     Assessment: 70 year old man with complex medical history on warfarin PTA for atrial fibrillation. His INR remains supratherapeutic at 5.19 today. No s/sx bleeding.  PTA warfarin dose: 2.5 mg on Tue/Wed/Th/Sat/Sun, no warfarin on Mon/Fri  *Dose verified with patient and wife  Goal of Therapy:  INR 2-3 Monitor platelets by anticoagulation protocol: Yes    Plan:   -Hold warfarin -Daily INR -Monitor for s/sx bleeding     Agapito GamesAlison Roan Sawchuk, PharmD, BCPS Clinical Pharmacist 01/16/2016 8:42 AM

## 2016-01-16 NOTE — Progress Notes (Signed)
Inpatient Diabetes Program Recommendations  AACE/ADA: New Consensus Statement on Inpatient Glycemic Control (2015)  Target Ranges:  Prepandial:   less than 140 mg/dL      Peak postprandial:   less than 180 mg/dL (1-2 hours)      Critically ill patients:  140 - 180 mg/dL   Lab Results  Component Value Date   GLUCAP 137 (H) 01/16/2016   HGBA1C 7.1 (H) 08/03/2012   Results for [name], Zachary D (MRN [id number]) as of 01/16/2016 13:45  Ref. Range 01/15/2016 21:55 01/16/2016 06:16 01/16/2016 06:44 01/16/2016 07:49  Glucose-Capillary Latest Ref Range: 65 - 99 mg/dL 70 41 (LL) 56 (L) 102124 (H)   Review of Glycemic Control  Diabetes history: DM2 Outpatient Diabetes medications: Lantus 52 units QHS, Novolog 20 units tidwc Current orders for Inpatient glycemic control: Novolog sensitive tidwc  Noted hypoglycemia above.  Likely d/t HS Lantus 30 units. HgbA1C pending. Will likely need small amount of Lantus.  Inpatient Diabetes Program Recommendations:    Add Lantus 15 units QHS Add CHO mod to heart healthy diet.  Will follow. Thank you. Ailene Ardshonda Abdul Beirne, RD, LDN, CDE Inpatient Diabetes Coordinator [phone]

## 2016-01-16 NOTE — Progress Notes (Signed)
Continuous Bipap initiated by RT. Rapid Response RN monitoring at bedside.

## 2016-01-16 NOTE — Progress Notes (Signed)
CRITICAL VALUE ALERT  Critical value received:  INR 5.19   Date of notification: 01/16/16  Time of notification:  0615  Critical value read back:  yes Nurse who received alert:  Priscella MannHeather Sahaj Bona, RN   MD notified (1st page): Dr. Laural BenesJohnson   Time of first page:  0615  MD notified (2nd page):  Time of second page:  Responding MD:  Dr. Laural BenesJohnson   Time MD responded:  [phone]

## 2016-01-16 NOTE — Consult Note (Signed)
CARDIOLOGY CONSULT NOTE   Patient ID: [name] MRN: [id number] DOB/AGE: [birth year] 70 y.o.  Admit date: 01/15/2016  Primary Physician   Annamaria BootsBALE,RENU, MD Primary Cardiologist   Dr. Donnie Ahoilley EP: Dr. Graciela HusbandsKlein Reason for Consultation   CHF  Requesting Physician  Dr. Cyndie ChimeGranfortuna  HPI: [name] is a 70 y.o. male with a history of Chronic diastolic heart failure, chronic kidney disease stage IV, hypertensive heart disease, paroxysmal atrial fibrillation, hypothyroidism, hyperlipidemia, sleep apnea somewhat compliant with CPAP, diabetes with neuropathy and LLLB with transient CHB s/p PPM who is consulted for CHF. Dr. Donnie Ahoilley is on medical leave.  Device check showed persistent afib since June 2016. Patient was loaded with amiodarone by Dr. Donnie Ahoilley beginning of October 2017. He was seen in A. fib clinic for further management. S/p successful cardioversion 01/03/16.   He came to the ER 01/15/16 for progressive worsening of dyspnea and abdominal pain. He states that he has been feeling poorly since initiation of amiodarone. Admits to having orthopnea, PND, lower extremity edema, shortness of breath. Complains of abdominal pain with early ascites. He says he has been using CPAP. Denies chest pain, palpitation or syncope. Admits to  having dark stool. On Iron therapy.   BNP showed 556. LFT unremarkable. CXR concerning for right upper lobe consolidation. Troponin negative. TSH elevated. Elevated PT/INR. Creatinine of 3.73 today. He was treated with IV antibiotic. He was given IV Lasix 40 mg x 3 and IV Lasix 80 mg x 1. Unable to accurately measure urine, required  I & O Cath.   He has a 100-pack-year cigarette smoking history. Echocardiogram 12/03/15 shows left ventricular function of 50%, moderate concentric LVH and grade 2 diastolic dysfunction.    Past Medical History:  Diagnosis Date  . Cardiac pacemaker in situ 08/03/2012   Indications transient complete heart block Insertion 08/03/12  Dr. Graciela HusbandsKlein  LV lead Medtronic 5076 serial number [id number]  LA lead Medtronic 5076 serial number [id number] Medtronic MRI compatible pulse generator serial number [id number].       . Chronic diastolic heart failure (HCC)   . Chronic kidney disease (CKD), stage III (moderate) 08/02/2012  . 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 Hyperlipidemia   . Hypertensive heart disease   . Hypothyroidism   . 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 LBBB (left bundle branch block) 08/02/2012  . Obesity (BMI 30-39.9)      Past Surgical History:  Procedure Laterality Date  . CARDIOVERSION N/A 01/03/2016   Procedure: CARDIOVERSION;  Surgeon: Laurey Moralealton S McLean, MD;  [facility];  Service: Cardiovascular;  Laterality: N/A;  . PERMANENT PACEMAKER INSERTION N/A 08/03/2012   Procedure: PERMANENT PACEMAKER INSERTION;  Surgeon: Duke SalviaSteven C Klein, MD;  [facility];  Service: Cardiovascular;  Laterality: N/A;    Allergies  Allergen Reactions  . Lasix [Furosemide]     6 years ago. Allergy, patient can take now     I have reviewed the patient's current medications . amiodarone  200 mg Oral Daily  . atorvastatin  40 mg Oral QHS  . azithromycin  250 mg Oral Daily  . Chlorhexidine Gluconate Cloth  6 each Topical Q0600  . cholecalciferol  2,000 Units Oral Daily  . ferrous sulfate  325 mg Oral Q breakfast  . folic acid  1 mg Oral Daily  . insulin aspart  0-9 Units Subcutaneous TID WC  . ipratropium-albuterol  3 mL Nebulization TID  . levothyroxine  25 mcg Oral QAC breakfast  .  metoprolol  12.5 mg Oral BID  . mupirocin ointment  1 application Nasal BID  . pantoprazole  40 mg Oral Daily  . senna-docusate  2 tablet Oral QHS  . sertraline  100 mg Oral Daily  . sodium chloride flush  3 mL Intravenous Q12H  . thiamine  100 mg Oral Daily  . Warfarin - Pharmacist Dosing Inpatient   Does not apply q1800    sodium chloride, acetaminophen, ondansetron (ZOFRAN) IV,  sodium chloride flush  Prior to Admission medications   Medication Sig Start Date End Date Taking? Authorizing Provider  albuterol-ipratropium (COMBIVENT) 18-103 MCG/ACT inhaler Inhale 1 puff into the lungs 4 (four) times daily.   Yes Historical Provider, MD  amiodarone (PACERONE) 200 MG tablet Take 200 mg by mouth daily.  11/13/15  Yes Historical Provider, MD  amLODipine (NORVASC) 5 MG tablet Take 1 tablet (5 mg total) by mouth daily. 08/05/12  Yes Othella BoyerWilliam S Tilley, MD  atorvastatin (LIPITOR) 80 MG tablet Take 40 mg by mouth at bedtime.   Yes Historical Provider, MD  Cholecalciferol (VITAMIN D) 2000 UNITS CAPS Take 2,000 Units by mouth daily.   Yes Historical Provider, MD  ferrous sulfate 325 (65 FE) MG tablet Take 325 mg by mouth 2 (two) times daily.   Yes Historical Provider, MD  fish oil-omega-3 fatty acids 1000 MG capsule Take 1 g by mouth 2 (two) times daily.   Yes Historical Provider, MD  folic acid (FOLVITE) 1 MG tablet Take 1 mg by mouth daily.   Yes Historical Provider, MD  furosemide (LASIX) 40 MG tablet 1 tablet 2 (two) times daily. 11/13/15  Yes Historical Provider, MD  insulin aspart (NOVOLOG) 100 UNIT/ML injection Inject 20 Units into the skin 3 (three) times daily before meals.   Yes Historical Provider, MD  insulin glargine (LANTUS) 100 UNIT/ML injection Inject 52 Units into the skin at bedtime.    Yes Historical Provider, MD  ketotifen (ZADITOR) 0.025 % ophthalmic solution Place 1 drop into both eyes 2 (two) times daily as needed.   Yes Historical Provider, MD  levothyroxine (SYNTHROID, LEVOTHROID) 25 MCG tablet Take 25 mcg by mouth every morning. Saturday and Sunday takes 2 tablets   Yes Historical Provider, MD  metoprolol (LOPRESSOR) 50 MG tablet Take 50 mg by mouth 2 (two) times daily.   Yes Historical Provider, MD  mirtazapine (REMERON) 15 MG tablet Take 15 mg by mouth at bedtime.   Yes Historical Provider, MD  omeprazole (PRILOSEC) 20 MG capsule Take 20 mg by mouth daily.   Yes  Historical Provider, MD  pentoxifylline (TRENTAL) 400 MG CR tablet Take 400 mg by mouth 2 (two) times daily.    Yes Historical Provider, MD  sertraline (ZOLOFT) 100 MG tablet Take 100 mg by mouth daily.   Yes Historical Provider, MD  thiamine (VITAMIN B-1) 100 MG tablet Take 100 mg by mouth daily.   Yes Historical Provider, MD  warfarin (COUMADIN) 2.5 MG tablet Take 2.5 mg by mouth one time only at 6 PM. Tuesday, Wednesday,thursday,saturday,sunday 11/13/15  Yes Historical Provider, MD  Cliffton AstersWhite Petrolatum-Mineral Oil (ARTIFICIAL TEARS) OINT ophthalmic ointment Place 1 application into both eyes every evening.   Yes Historical Provider, MD     Social History   Social History  . Marital status: Married    Spouse name: N/A  . Number of children: N/A  . Years of education: N/A   Occupational History  . Not on file.   Social History Main Topics  . Smoking status:  Former Smoker  . Smokeless tobacco: Never Used  . Alcohol use No  . Drug use: No  . Sexual activity: Not on file   Other Topics Concern  . Not on file   Social History Narrative  . No narrative on file    No family status information on file.   No family hx of CAD.   ROS:  Full 14 point review of systems complete and found to be negative unless listed above.  Physical Exam: Blood pressure (!) 122/56, pulse 67, temperature 97.6 F (36.4 C), temperature source Oral, resp. rate 18, height 5\' 11"  (1.803 m), weight 274 lb 3.2 oz (124.4 kg), SpO2 100 %.  General: obese male in no acute distress, requiring oxygen Head: Eyes PERRLA, No xanthomas. Normocephalic and atraumatic, oropharynx without edema or exudate.  Lungs: Resp regular and unlabored, diminished breath sound due to grith.  Heart: RRR no s3, s4, or murmurs..   Neck: No carotid bruits. No lymphadenopathy.  Unable to assess JVD due to grith Abdomen: Bowel sounds present, distended without masses or hernias noted. Msk:  No spine or cva tenderness. No weakness, no joint  deformities or effusions. Extremities: No clubbing, cyanosis. 2+ BL LE edema. DP/PT/Radials 2+ and equal bilaterally. Neuro: lethargic.  No focal deficits noted. Psych:  drowsy  Skin: multiple scattered bruise  Labs:   Lab Results  Component Value Date   WBC 8.7 01/15/2016   HGB 8.5 (L) 01/15/2016   HCT 28.0 (L) 01/15/2016   MCV 90.9 01/15/2016   PLT 238 01/15/2016    Recent Labs  01/16/16 0511  INR 5.19*    Recent Labs Lab 01/15/16 0849 01/16/16 0511  NA 140 141  K 3.9 4.0  CL 107 107  CO2 22 23  BUN 64* 69*  CREATININE 3.58* 3.73*  CALCIUM 9.3 9.1  PROT 7.6  --   BILITOT 0.6  --   ALKPHOS 82  --   ALT 13*  --   AST 17  --   GLUCOSE 47* 43*  ALBUMIN 2.9*  --    Magnesium  Date Value Ref Range Status  08/02/2012 1.6 1.5 - 2.5 mg/dL Final    Recent Labs  16/10/96 1528 01/15/16 2040  TROPONINI <0.03 <0.03    Recent Labs  01/15/16 0858  TROPIPOC 0.00   Pro B Natriuretic peptide (BNP)  Date/Time Value Ref Range Status  08/03/2012 04:28 AM 1,959.0 (H) 0 - 125 pg/mL Final  08/02/2012 03:01 AM 1,103.0 (H) 0 - 125 pg/mL Final   Lab Results  Component Value Date   CHOL 135 08/02/2012   HDL 27 (L) 08/02/2012   LDLCALC 80 08/02/2012   TRIG 139 08/02/2012   No results found for: DDIMER No results found for: LIPASE, AMYLASE TSH  Date/Time Value Ref Range Status  01/15/2016 03:10 PM 6.428 (H) 0.350 - 4.500 uIU/mL Final    Comment:    Performed by a 3rd Generation assay with a functional sensitivity of <=0.01 uIU/mL.  08/02/2012 03:01 AM 1.735 0.350 - 4.500 uIU/mL Final     Radiology:  Dg Chest 2 View  Result Date: 01/15/2016 CLINICAL DATA:  Shortness of Breath EXAM: CHEST  2 VIEW COMPARISON:  November 14, 2015 FINDINGS: There is airspace consolidation in the anterior segment of the right upper lobe consistent with pneumonia. There is scarring in the left base region. Heart is borderline enlarged with pulmonary vascularity within normal limits.  There is atherosclerotic calcification in the aorta. No adenopathy. Pacemaker leads are attached  to the right atrium and right ventricle. Bony structures appear intact. IMPRESSION: Anterior segment right upper lobe airspace consolidation consistent with pneumonia. Stable scarring left base. Stable cardiac silhouette. There is aortic atherosclerosis. Followup PA and lateral chest radiographs recommended in 3-4 weeks following trial of antibiotic therapy to ensure resolution and exclude underlying malignancy. Electronically Signed   By: Bretta Bang III M.D.   On: 01/15/2016 09:23    ASSESSMENT AND PLAN:     1. Acute on chronic diastolic CHF - BNP of 556. Unable to accurately measure urine output as he was required cath. Still significantly volume overload. Scr of 3.73 today it was 3.58. - Worsening symptoms since initiation on amiodarone requiring oxygen and abnormal thyroid level. ? l benefit from discontinuation of amiodarone, however started about 2 months ago.  Consider PFT.  - Will discuss diuretics with MD.   2. Paroxysmal A. Fib - Status post cardioversion 11/16. CHADSVASCS score of 4. On Coumadin per pharmacy for anticoagulation. Elevated PT/INR. Multiple bruise noted. Paced rhythm on EKG and telemetry. Seem underlying afib. Will check device.   3. Pneumonia - Per primary  4. HX of CHB s/p PPM - intermittent pacing   Signed: Bhagat,Bhavinkumar, PA 01/16/2016, 3:25 PM Pager [phone]  Co-Sign MD  Patient seen and examined and history reviewed. Agree with above findings and plan. 70 yo WM with history of AFib, CKD stage 4, CHB s/p PPM presents with worsening symptoms of dyspnea, edema, increased abdominal girth. Symptoms began in early October when he was found to be in AFib. He was anticoagulated. Started on oral amiodarone and underwent DCCV on 01/03/16. Really no improvement in symptoms which have continued to progress. Now admitted. Currently on Bipap and somnolent. Most of  history obtained from wife. On exam he is morbidly obese. JVD difficult to assess with Bipap in place. Lungs decreased BS CV RRR without gallop or rub. No murmur Abd: obese, distended.  LE with 2-3+ edema.  Telemetry shows V pacing. I suspect underlying rhythm is Afib  Labs, Ecg, CXR personally reviewed.  Impression: Acute on chronic diastolic CHF. Exacerbated by Afib and CKD. Need more aggressive diuresis. Suggest lasix 160 mg IV bid. Monitor I/O, daily weight. Follow BMET closely.   Afib- s/p DCCV on 11/16. Appears to still be in AFib. Will interrogate pacemaker to confirm and see how long he was in NSR. Limited options from the standpoint of antiarrhythmic therapy. May need to consider reload of amiodarone and repeat DCCV at a later date. I think it is unlikely that he has amiodarone toxicity at this point on current doses. He is a poor candidate for ablation. Continue anticoagulation with coumadin.  PNA by CXR. Plan per primary team. Currently on Bipap  CHB s/p pacemaker implant.   CKD stage 4. This is contributing to his volume overload. If renal function deteriorates further will need Nephrology to see.   Hypertensive heart disease  DM with neuropathy  History of tobacco abuse.    Cirilo Canner [place], MDFACC 01/16/2016 5:33 PM

## 2016-01-16 NOTE — Progress Notes (Signed)
Patient transferred from 3E to 4NP05. Bed in low position, yellow on-skid socks in place, bed alarm on. Full assessment to Epic; skin assessed with Perry MountNiki B., RN, mult. Excoriation/scratch marks on arms, abdomen and legs; stage II noted to left buttocks- CDI, WOC consult placed, foam dsg soiled and replaced. Pt on BiPAP, sats 100%, VSS, NAD, MDs at bedside. Foley in place with red/orange urine, MDs aware/observed. IV access lost- consult to IV team due to edema and poor access. Will continue to monitor.

## 2016-01-16 NOTE — Progress Notes (Signed)
Notified MD that blood sugar this morning was 41, after snack of juice and pudding sugar only came up to 56.  Order for amp of D50 ordered and administered.

## 2016-01-16 NOTE — Progress Notes (Signed)
Patient alert and oriented,  Lung sounds tight, long expiration.  Heart tones irregular.  Paced 62. BP 122/56  RR 18  o2 sat 96% on 2l [place]   RN to call if patient worsens before he transfers to SDU,  Will continue to monitor.

## 2016-01-16 NOTE — Progress Notes (Signed)
Patient with relatively quiet night, did awaken once and report shortness of breath but felt better after being pulled up and repositioned in the bed.  O2 sats remained in high 90's on 2 liters.  Critical values reported to MD as per other notes.

## 2016-01-16 NOTE — Progress Notes (Signed)
Subjective: Hypoglycemia to 40s-50s this AM, ate a carb snack but very sleepy. Given 1 amp D50. Accessory muscle use, tripod position, tachypneic. Pool of blood in his bed from IV site on right hand. Applied pressure and bandage with RN assistance. Ordered IV vitamin K, BIPAP, transfer to SDU. Appears hypervolemic with peripheral edema, bibasilar crackles, wheezing - ordered Lasix 80mg  IV. Lethargic affect but awake and conversant.  Objective: Vital signs in last 24 hours: Vitals:   01/15/16 1554 01/15/16 2024 01/16/16 0028 01/16/16 0510  BP: 125/74 (!) 122/59 (!) 116/44 (!) 160/60  Pulse: 63 60 62 62  Resp: 18 18 18 18   Temp: 97.8 F (36.6 C) 98.1 F (36.7 C) 98.7 F (37.1 C) 98.5 F (36.9 C)  TempSrc: Oral Oral Oral Oral  SpO2: 95% 100% 100% 95%  Weight: 124.6 kg (274 lb 11.2 oz)   124.4 kg (274 lb 3.2 oz)  Height: 5\' 11"  (1.803 m)       Intake/Output Summary (Last 24 hours) at 01/16/16 0645 Last data filed at 01/16/16 0315  Gross per 24 hour  Intake              670 ml  Output              450 ml  Net              220 ml   Filed Weights   01/15/16 1554 01/16/16 0510  Weight: 124.6 kg (274 lb 11.2 oz) 124.4 kg (274 lb 3.2 oz)   Physical Exam General appearance: Obese gentleman sitting forward in bed, bracing his arms against his knees and breathing rapidly, in moderate distress, blood-soaked sheets revealed when he turned to sit on side of bed HENT: Normocephalic, short neck, moist mucous membranes Cardiovascular: RRR, no murmurs, rubs, gallops Respiratory/Chest: Bibasilar crackles, expiratory wheeze, shallow breaths, accessory abdominal muscle use for breathing Abdomen: Obese, soft, non-tender, mild distension Ext: Obese bulk, normal ROM, 2-3+ pitting edema of BLE to upper thighs, IV site on dorsum right hand with brisk venous bleeding Skin: Warm, dry, diffuse abrasions and bruising over forearms,  Psych: Lethargic affect  Labs / Imaging / Procedures: CBC Latest Ref  Rng & Units 01/15/2016 12/27/2015 12/20/2015  WBC 4.0 - 10.5 K/uL 8.7 8.0 10.4  Hemoglobin 13.0 - 17.0 g/dL 9.6(E8.5(L) 4.5(W8.7(L) 0.9(W9.1(L)  Hematocrit 39.0 - 52.0 % 28.0(L) 27.9(L) 29.6(L)  Platelets 150 - 400 K/uL 238 214 248   BMP Latest Ref Rng & Units 01/16/2016 01/15/2016 12/27/2015  Glucose 65 - 99 mg/dL 11(BJ43(LL) 47(W47(L) 78  BUN 6 - 20 mg/dL 29(F69(H) 62(Z64(H) 30(Q60(H)  Creatinine 0.61 - 1.24 mg/dL 6.57(Q3.73(H) 4.69(G3.58(H) 2.95(M3.13(H)  Sodium 135 - 145 mmol/L 141 140 139  Potassium 3.5 - 5.1 mmol/L 4.0 3.9 3.9  Chloride 101 - 111 mmol/L 107 107 104  CO2 22 - 32 mmol/L 23 22 26   Calcium 8.9 - 10.3 mg/dL 9.1 9.3 9.0   Results for [name], Zachary D (MRN [id number]) as of 01/16/2016 19:07  Ref. Range 01/15/2016 15:28 01/16/2016 05:11  INR Unknown 4.68 (HH) 5.19 (HH)   Dg Chest 2 View  Result Date: 01/15/2016 CLINICAL DATA:  Shortness of Breath EXAM: CHEST  2 VIEW COMPARISON:  November 14, 2015 FINDINGS: There is airspace consolidation in the anterior segment of the right upper lobe consistent with pneumonia. There is scarring in the left base region. Heart is borderline enlarged with pulmonary vascularity within normal limits. There is atherosclerotic calcification in the aorta. No adenopathy. Pacemaker leads  are attached to the right atrium and right ventricle. Bony structures appear intact. IMPRESSION: Anterior segment right upper lobe airspace consolidation consistent with pneumonia. Stable scarring left base. Stable cardiac silhouette. There is aortic atherosclerosis. Followup PA and lateral chest radiographs recommended in 3-4 weeks following trial of antibiotic therapy to ensure resolution and exclude underlying malignancy. Electronically Signed   By: Bretta BangWilliam  Woodruff III M.D.   On: 01/15/2016 09:23    Assessment/Plan: 70 year old man with history of chronic diastolic heart failure, pacemaker for complete heart block, CKD3, DM2, HLD, Hypothyroidism, PAF presenting dyspnea and abdominal pain.  Respiratory distress,  likely multifactorial - suspect AoC dCHF, pulmonary infiltrate concerning for pneumonia, and likely COPD in setting of 100 pack year smoking history: He has grade 3 diastolic dysfunction and EF of 50% on Echo October 2017, BNP 556.4, basilar rales and 3+ pitting edema to thighs bilaterally. Expiratory wheezing and smoking hx. He remains afebrile with no leukocytosis, procal 0.10 -Trend troponins - negative x3 - AM EKG no signif changes - No net output with Lasix 40mg  IV BID yesterday  - Lasix 80mg  IV x1 late this morning -Strict I/O, daily weights -Place urethral catheter for strict I/O and poor patient mobility -Continue azithromycin -Follow up blood cultures - Duonebs Q6 -BiPAP for respiratory distress -Notified his primary cardiology Dr. Donnie Ahoilley, appreciating inpatient cardiology recommendations via Dr. [place]   Supratherapeutic INR, brisk bleeding from IV site this AM, removed and pressure bandage placed, hematuria present in urinary cath collection bag - IV Vitamin K 5mg  x1 today  - Trend daily INR - Hold warfarin  AKI on CKD4: BUN 64 and creatinine 3.58 on admission. Previously creatinine was around 3 in early November. Likely cardiorenal -Continue to trend BMP - Urethral cath in place  Paroxysmal atrial fibrillation, CHADSVASc score 7: cardioversion with subsequent sinus rhythm on 11/16. Rhythm appears unchanged today. -Continue home amiodarone 200mg  daily - Hold warfarin  DM2, hypoglycemia this AM with poor po intake: Glucose 47 on presentation. On repeat measurement was 110. A1c 7.1 in 2014.  -DC Lantus.  -SSI with CBGs QID -Check A1c.   Anemia of chronic disease: Hgb 8.5 on admission with normal MCV. Baseline is around 9. -Continue home ferrous sulfate  HTN: Currently normotensive -reduce home metoprolol to 12.5mg  BID -hold home amlodipine  HLD: continue home atorvastatin  Hypothyroidism: Last TSH 8.141 on 11/9 -Continue home Synthroid 25mcg -Check  TSH  OSA: BiPAP vs CPAP QHS  Depression: Continue home Zoloft. Hold off on home Remeron for now  FEN: HH VTE ppx: Warfarin /supratherapeutic Code Status: FULL  Dispo: Anticipated discharge in approximately 3-4 day(s).   LOS: 0 days   Althia FortsAdam Deveney Bayon, MD 01/16/2016, 6:45 AM Pager: [phone]

## 2016-01-17 ENCOUNTER — Inpatient Hospital Stay (HOSPITAL_COMMUNITY): Payer: Medicare HMO

## 2016-01-17 DIAGNOSIS — J449 Chronic obstructive pulmonary disease, unspecified: Secondary | ICD-10-CM

## 2016-01-17 DIAGNOSIS — N184 Chronic kidney disease, stage 4 (severe): Secondary | ICD-10-CM

## 2016-01-17 DIAGNOSIS — J441 Chronic obstructive pulmonary disease with (acute) exacerbation: Secondary | ICD-10-CM

## 2016-01-17 DIAGNOSIS — N179 Acute kidney failure, unspecified: Secondary | ICD-10-CM

## 2016-01-17 DIAGNOSIS — N189 Chronic kidney disease, unspecified: Secondary | ICD-10-CM | POA: Insufficient documentation

## 2016-01-17 DIAGNOSIS — J181 Lobar pneumonia, unspecified organism: Secondary | ICD-10-CM

## 2016-01-17 DIAGNOSIS — E11649 Type 2 diabetes mellitus with hypoglycemia without coma: Secondary | ICD-10-CM

## 2016-01-17 DIAGNOSIS — J189 Pneumonia, unspecified organism: Secondary | ICD-10-CM

## 2016-01-17 LAB — BASIC METABOLIC PANEL
ANION GAP: 12 (ref 5–15)
BUN: 71 mg/dL — ABNORMAL HIGH (ref 6–20)
CHLORIDE: 104 mmol/L (ref 101–111)
CO2: 23 mmol/L (ref 22–32)
Calcium: 9 mg/dL (ref 8.9–10.3)
Creatinine, Ser: 3.82 mg/dL — ABNORMAL HIGH (ref 0.61–1.24)
GFR calc non Af Amer: 15 mL/min — ABNORMAL LOW (ref >60)
GFR, EST AFRICAN AMERICAN: 17 mL/min — AB (ref >60)
Glucose, Bld: 116 mg/dL — ABNORMAL HIGH (ref 65–99)
POTASSIUM: 4.1 mmol/L (ref 3.5–5.1)
SODIUM: 139 mmol/L (ref 135–145)

## 2016-01-17 LAB — GLUCOSE, CAPILLARY
GLUCOSE-CAPILLARY: 156 mg/dL — AB (ref 65–99)
GLUCOSE-CAPILLARY: 190 mg/dL — AB (ref 65–99)
Glucose-Capillary: 113 mg/dL — ABNORMAL HIGH (ref 65–99)

## 2016-01-17 LAB — PROTIME-INR
INR: 1.79
PROTHROMBIN TIME: 21.1 s — AB (ref 11.4–15.2)

## 2016-01-17 LAB — CBC
HCT: 25.7 % — ABNORMAL LOW (ref 39.0–52.0)
HEMOGLOBIN: 7.7 g/dL — AB (ref 13.0–17.0)
MCH: 27.7 pg (ref 26.0–34.0)
MCHC: 30 g/dL (ref 30.0–36.0)
MCV: 92.4 fL (ref 78.0–100.0)
PLATELETS: 206 10*3/uL (ref 150–400)
RBC: 2.78 MIL/uL — AB (ref 4.22–5.81)
RDW: 18.8 % — ABNORMAL HIGH (ref 11.5–15.5)
WBC: 7.7 10*3/uL (ref 4.0–10.5)

## 2016-01-17 LAB — HEMOGLOBIN A1C
HEMOGLOBIN A1C: 6.4 % — AB (ref 4.8–5.6)
Mean Plasma Glucose: 137 mg/dL

## 2016-01-17 LAB — HEPARIN LEVEL (UNFRACTIONATED): Heparin Unfractionated: 0.9 IU/mL — ABNORMAL HIGH (ref 0.30–0.70)

## 2016-01-17 LAB — T4, FREE: FREE T4: 1 ng/dL (ref 0.61–1.12)

## 2016-01-17 LAB — PROCALCITONIN: PROCALCITONIN: 0.14 ng/mL

## 2016-01-17 MED ORDER — AMIODARONE LOAD VIA INFUSION
150.0000 mg | Freq: Once | INTRAVENOUS | Status: AC
  Administered 2016-01-17: 150 mg via INTRAVENOUS
  Filled 2016-01-17: qty 83.34

## 2016-01-17 MED ORDER — SODIUM CHLORIDE 0.9% FLUSH
10.0000 mL | INTRAVENOUS | Status: DC | PRN
  Administered 2016-01-18 – 2016-01-20 (×2): 10 mL
  Filled 2016-01-17 (×2): qty 40

## 2016-01-17 MED ORDER — AMIODARONE HCL IN DEXTROSE 360-4.14 MG/200ML-% IV SOLN
30.0000 mg/h | INTRAVENOUS | Status: DC
  Administered 2016-01-17 – 2016-01-21 (×9): 30 mg/h via INTRAVENOUS
  Filled 2016-01-17 (×10): qty 200

## 2016-01-17 MED ORDER — FUROSEMIDE 10 MG/ML IJ SOLN
160.0000 mg | Freq: Four times a day (QID) | INTRAVENOUS | Status: DC
  Administered 2016-01-17 – 2016-01-18 (×2): 160 mg via INTRAVENOUS
  Filled 2016-01-17 (×6): qty 16

## 2016-01-17 MED ORDER — LEVOTHYROXINE SODIUM 75 MCG PO TABS
37.5000 ug | ORAL_TABLET | Freq: Every day | ORAL | Status: DC
  Administered 2016-01-18 – 2016-01-29 (×9): 37.5 ug via ORAL
  Filled 2016-01-17 (×11): qty 1

## 2016-01-17 MED ORDER — LEVOTHYROXINE SODIUM 50 MCG PO TABS: 50.0000 ug | ORAL_TABLET | Freq: Every day | ORAL | Status: DC

## 2016-01-17 MED ORDER — METOLAZONE 5 MG PO TABS
10.0000 mg | ORAL_TABLET | Freq: Two times a day (BID) | ORAL | Status: DC
  Administered 2016-01-17 – 2016-01-18 (×2): 10 mg via ORAL
  Filled 2016-01-17 (×2): qty 2

## 2016-01-17 MED ORDER — HEPARIN (PORCINE) IN NACL 100-0.45 UNIT/ML-% IJ SOLN
1300.0000 [IU]/h | INTRAMUSCULAR | Status: DC
  Administered 2016-01-17: 1300 [IU]/h via INTRAVENOUS
  Administered 2016-01-17: 1500 [IU]/h via INTRAVENOUS
  Filled 2016-01-17 (×2): qty 250

## 2016-01-17 MED ORDER — HEPARIN BOLUS VIA INFUSION
4000.0000 [IU] | Freq: Once | INTRAVENOUS | Status: AC
  Administered 2016-01-17: 4000 [IU] via INTRAVENOUS
  Filled 2016-01-17: qty 4000

## 2016-01-17 MED ORDER — AMIODARONE HCL IN DEXTROSE 360-4.14 MG/200ML-% IV SOLN
60.0000 mg/h | INTRAVENOUS | Status: DC
  Administered 2016-01-17: 60 mg/h via INTRAVENOUS
  Filled 2016-01-17: qty 200

## 2016-01-17 MED ORDER — WARFARIN SODIUM 2.5 MG PO TABS
2.5000 mg | ORAL_TABLET | Freq: Once | ORAL | Status: AC
  Administered 2016-01-17: 2.5 mg via ORAL
  Filled 2016-01-17: qty 1

## 2016-01-17 NOTE — Progress Notes (Signed)
Subjective: [name] is feeling well this morning, taking a break from BiPAP and eating breakfast with nasal cannula. His work of breathing has improved since yesterday. He reports that the BiPAP really helps his breathing and he gets less tired when wearing it. He also started IV Lasix 160 mg BID last night per cardiology and had -800 cc net output in last 24 hours. Appears to be in atrial fibrillation on the monitor with intermittent ventricular pacing.  Objective: Vital signs in last 24 hours: Vitals:   01/17/16 0342 01/17/16 0400 01/17/16 0500 01/17/16 0600  BP: (!) 112/54 134/71 120/89 116/86  Pulse:  61 61 62  Resp: 15 16 19 20   Temp: 97.9 F (36.6 C)     TempSrc: Oral     SpO2: 99% 99% 100% 100%  Weight: 124.2 kg (273 lb 14.4 oz)     Height:        Intake/Output Summary (Last 24 hours) at 01/17/16 0656 Last data filed at 01/17/16 0600  Gross per 24 hour  Intake             1043 ml  Output             1875 ml  Net             -832 ml   Filed Weights   01/15/16 1554 01/16/16 0510 01/17/16 0342  Weight: 124.6 kg (274 lb 11.2 oz) 124.4 kg (274 lb 3.2 oz) 124.2 kg (273 lb 14.4 oz)   Physical Exam General appearance: Obese gentleman laying comfortably in bed, eating breakfast HENT: Normocephalic, short neck, moist mucous membranes Cardiovascular: Irregular rhythm but normal rate, no murmurs, rubs, gallops Respiratory/Chest: Bibasilar crackles, expiratory wheeze, shallow breaths, accessory abdominal muscle use for breathing Abdomen: Obese, soft, non-tender, mild distension Ext: Obese bulk, normal ROM, 2-3+ pitting edema of BLE to upper thighs, IV site on dorsum right hand with brisk venous bleeding Skin: Warm, dry, diffuse abrasions and bruising over forearms,  Psych: Lethargic affect  Labs / Imaging / Procedures: CBC Latest Ref Rng & Units 01/17/2016 01/15/2016 12/27/2015  WBC 4.0 - 10.5 K/uL 7.7 8.7 8.0  Hemoglobin 13.0 - 17.0 g/dL 7.7(L) 8.5(L) 8.7(L)  Hematocrit  39.0 - 52.0 % 25.7(L) 28.0(L) 27.9(L)  Platelets 150 - 400 K/uL 206 238 214   BMP Latest Ref Rng & Units 01/17/2016 01/16/2016 01/15/2016  Glucose 65 - 99 mg/dL 161(W116(H) 96(EA43(LL) 54(U47(L)  BUN 6 - 20 mg/dL 98(J71(H) 19(J69(H) 47(W64(H)  Creatinine 0.61 - 1.24 mg/dL 2.95(A3.82(H) 2.13(Y3.73(H) 8.65(H3.58(H)  Sodium 135 - 145 mmol/L 139 141 140  Potassium 3.5 - 5.1 mmol/L 4.1 4.0 3.9  Chloride 101 - 111 mmol/L 104 107 107  CO2 22 - 32 mmol/L 23 23 22   Calcium 8.9 - 10.3 mg/dL 9.0 9.1 9.3   Results for [name], [name] (MRN [id number]) as of 01/17/2016 11:17  Ref. Range 01/15/2016 15:28 01/16/2016 05:11 01/17/2016 05:36  INR Unknown 4.68 (HH) 5.19 (HH) 1.79   Dg Chest 2 View  Result Date: 01/17/2016 CLINICAL DATA:  Shortness of breath and wheezing today. History of CHF, pneumonia, former smoker. EXAM: CHEST  2 VIEW COMPARISON:  PA and lateral chest x-ray of January 15, 2016 FINDINGS: The lungs are less well inflated today but the patient is also positioned in a lordotic fashion. Confluent alveolar opacity persists in the right upper lobe. Elsewhere the interstitial markings remain increased. The pulmonary vascularity is more prominent today and cardiac silhouette remains enlarged. Small amounts of pleural fluid  blunt the left lateral and posterior costophrenic angles. The ICD is in stable position. IMPRESSION: CHF with pulmonary interstitial edema and small left pleural effusion. Right upper lobe pneumonia. Overall the appearance of the chest has deteriorated somewhat but a portion of this is related to the lordotic positioning. Electronically Signed   By: Zachary  Swazil[place]Jordan [name].   On: 01/17/2016 08:01    Assessment/Plan: 70 year old man with history of chronic diastolic heart failure, pacemaker for complete heart block, CKD3, DM2, HLD, Hypothyroidism, PAF presenting dyspnea and abdominal pain.  Respiratory distress, likely multifactorial - suspect AoC dCHF, pulmonary infiltrate concerning for pneumonia, and likely COPD in setting  of 100 pack year smoking history: He has grade 3 diastolic dysfunction and EF of 50% on Echo October 2017, BNP 556.4, basilar rales and 3+ pitting edema to thighs bilaterally. Expiratory wheezing and smoking hx. He remains afebrile with no leukocytosis, procal 0.10 - Appreciate cardiology recs, plan to have pacemaker interrogated and potential reload with amio and repeat DCCV - Lasix 160mg  IV BID -Strict I/O, daily weights -urethral catheter for accurate I/Os -Azithromycin -Follow up blood cultures - NGTD - Duonebs Q6 -BiPAP for respiratory distress  Anticoagulation, initially supratherapeutic INR 5.2 on 11/29 with IV site bleeding, s/p IV vitamin K. Today subtherapeutic with INR 1.79 - Heparin gtt until INR >2 - Warfarin per pharmacy - Trend INR  AKI on CKD4: BUN 64 and creatinine 3.58 on admission. Previously creatinine was around 3 in early November. Likely cardiorenal, in setting of chronic progressive renal failure. [facility] [place] but we have no records. - Cr 3.82 from 3.73 yesterday, not bad considering high dose lasix - Discussed with renal, worth an evaluation so that our nephrologists know his history, given that he comes here for acute medical issues and will eventually need dialysis - Trend BMP - Urethral cath in place  Paroxysmal atrial fibrillation, CHADSVASc score 7: cardioversion with subsequent sinus rhythm on 11/16. Rhythm appears to be afib with ventricular pacing. - Cardiology to interrogate pacemaker, consider re-dose Amio / DCCV - Continue home amiodarone 200mg  daily - Heparin gtt until INR >2 - Warfarin per pharmacy  DM2, hypoglycemia this AM with poor po intake: Glucose 47 on presentation. On repeat measurement was 110. A1c 7.7 on 01/17/16 -DC Lantus.  -SSI with CBGs QID   Anemia of chronic disease: Hb 7.7 from 8.5 on admit, normal MCV. Baseline is around 9. - Continue home ferrous sulfate - Trend CBC, transfusion threshold  7  HTN: Currently normotensive -reduce home metoprolol to 12.5mg  BID -hold home amlodipine  HLD: continue home atorvastatin  Hypothyroidism: Last TSH 8.141 on 11/9 -Continue home Synthroid 25mcg -Check TSH  OSA: BiPAP   Depression: Continue home Zoloft. Hold off on home Remeron for now  FEN: HH VTE ppx: Heparin gtt, Warfarin Code Status: FULL  Dispo: Anticipated discharge in approximately 3-4 day(s).   LOS: 1 day   Althia FortsAdam Sartaj Hoskin, MD 01/17/2016, 6:56 AM Pager: [phone]
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
## 2016-01-17 NOTE — Progress Notes (Signed)
Medicine attending: I examined this patient today together with resident physician Dr. Althia FortsAdam Johnson and I concur with his evaluation and management plan which we discussed together. We appreciate cardiology assistance. We also discussed his status with our nephrologist. This patient appears to be heading towards end-stage renal disease/dialysis. Diuretic regimen intensified. Oxygenation improved and he is clinically breathing more comfortably on BiPAP. One of his daughters is present. He does in fact, also have COPD. This is what we suspected clinically. He remains afebrile and with a normal white count despite pulmonary infiltrate on chest x-ray. I would consider getting a non contrast CT scan of the chest when he is otherwise stable from the respiratory point of view to look for other potential lung pathology such as neoplasm. His pacemaker will be interrogated today. Follow cardiology recommendations re-increasing amiodarone dose with attempt to medically convert his atrial fibrillation. If he does not convert medically, DC cardioversion being considered.

## 2016-01-17 NOTE — Progress Notes (Signed)
ANTICOAGULATION CONSULT NOTE - Initial Consult  Pharmacy Consult for heparin and warfarin Indication: atrial fibrillation/ bridging with warfarin  No Known Allergies  Patient Measurements: Height: 5\' 11"  (180.3 cm) Weight: 273 lb 14.4 oz (124.2 kg) IBW/kg (Calculated) : 75.3 Heparin Dosing Weight: 103.3 kg   Vital Signs: Temp: 97.9 F (36.6 C) (11/30 0342) Temp Source: Oral (11/30 0342) BP: 118/65 (11/30 0700) Pulse Rate: 65 (11/30 0700)  Labs:  Recent Labs  01/15/16 0849 01/15/16 1528 01/15/16 2040 01/16/16 0511 01/17/16 0536  HGB 8.5*  --   --   --  7.7*  HCT 28.0*  --   --   --  25.7*  PLT 238  --   --   --  206  LABPROT  --  45.4*  --  49.3* 21.1*  INR  --  4.68*  --  5.19* 1.79  CREATININE 3.58*  --   --  3.73* 3.82*  TROPONINI  --  <0.03 <0.03  --   --     Estimated Creatinine Clearance: 24.2 mL/min (by C-G formula based on SCr of 3.82 mg/dL (H)).   Medical History: Past Medical History:  Diagnosis Date  . Cardiac pacemaker in situ 08/03/2012   Indications transient complete heart block Insertion 08/03/12 Dr. Graciela HusbandsKlein  LV lead Medtronic 5076 serial number [id number]  LA lead Medtronic 5076 serial number [id number] Medtronic MRI compatible pulse generator serial number [id number] H.       . Chronic diastolic heart failure (HCC)   . Chronic kidney disease (CKD), stage III (moderate) 08/02/2012  . 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 Hyperlipidemia   . Hypertensive heart disease   . Hypothyroidism   . 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 LBBB (left bundle branch block) 08/02/2012  . Obesity (BMI 30-39.9)      Assessment: MB is a 70 yo male who was on warfarin PTA for afib . INR on admission was 4.68 and rose to 5.19 yesterday 11/29. Patient was given 5 mg of IV Vitamin K 11/29 due to elevated INR and brisk bleeding from IV site and hematuria. INR is subtherapeutic this AM at 1.79. Pharmacy has been consulted to start heparin  for bridging due to possible need for cardioversion. CBC down today H/H- 7.7/25.7. Platelets are within normal limits at 206.Spoke with nurse and small amount of blood noted in urine last PM. No other signs of bleeding. Will bolus due to patient risk factors and monitor closely.   Re-start warfarin at 2.5 mg tonight. Would expect INR to continue to trend down tomorrow due to dose of Vitamin K. Of note, patient is on amiodarone and azithromycin inpatient which can increase levels of warfarin.   PTA warfarin dose: 2.5 mg on Tue/Wed/Th/Sat/Sun, no warfarin on Mon/Fri  *Dose verified with patient and wife Goal of Therapy:  INR 2-3  Heparin level 0.3-0.7 units/ml Monitor platelets by anticoagulation protocol: Yes   Plan:  Heparin 4000 units x 1  Then Heparin at 1500 units/hr 8 hour heparin level Daily heparin level and CBC  Warfarin 2.5 mg x 1 tonight Daily INR  Monitor for signs and symptoms of bleeding  Hillis RangeEmily A Stewart, PharmD PGY1 Pharmacy Resident Pager: [phone] 01/17/2016,7:56 AM

## 2016-01-17 NOTE — Progress Notes (Addendum)
ANTICOAGULATION CONSULT NOTE - Follow Up Consult  Pharmacy Consult for heparin and warfarin Indication: atrial fibrillation/ bridging with warfarin  No Known Allergies  Patient Measurements: Height: 5\' 11"  (180.3 cm) Weight: 273 lb 14.4 oz (124.2 kg) IBW/kg (Calculated) : 75.3 Heparin Dosing Weight: 103.3 kg   Vital Signs: Temp: 97.7 F (36.5 C) (11/30 1531) Temp Source: Axillary (11/30 1531) BP: 116/63 (11/30 1228) Pulse Rate: 63 (11/30 1531)  Labs:  Recent Labs  01/15/16 0849 01/15/16 1528 01/15/16 2040 01/16/16 0511 01/17/16 0536 01/17/16 1713  HGB 8.5*  --   --   --  7.7*  --   HCT 28.0*  --   --   --  25.7*  --   PLT 238  --   --   --  206  --   LABPROT  --  45.4*  --  49.3* 21.1*  --   INR  --  4.68*  --  5.19* 1.79  --   HEPARINUNFRC  --   --   --   --   --  0.90*  CREATININE 3.58*  --   --  3.73* 3.82*  --   TROPONINI  --  <0.03 <0.03  --   --   --     Estimated Creatinine Clearance: 24.2 mL/min (by C-G formula based on SCr of 3.82 mg/dL (H)).   Medical History: Past Medical History:  Diagnosis Date  . Cardiac pacemaker in situ 08/03/2012   Indications transient complete heart block Insertion 08/03/12 Dr. Graciela HusbandsKlein  LV lead Medtronic 5076 serial number [id number]  LA lead Medtronic 5076 serial number [id number] Medtronic MRI compatible pulse generator serial number [id number] H.       . Chronic diastolic heart failure (HCC)   . Chronic kidney disease (CKD), stage III (moderate) 08/02/2012  . 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 Hyperlipidemia   . Hypertensive heart disease   . Hypothyroidism   . 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 LBBB (left bundle branch block) 08/02/2012  . Obesity (BMI 30-39.9)      Assessment: [name] is a 70 yo male who was on warfarin PTA for afib . INR on admission was 4.68 and rose to 5.19 yesterday 11/29. Patient was given 5 mg of IV Vitamin K 11/29 due to elevated INR and brisk bleeding from IV  site and hematuria. INR is subtherapeutic this AM at 1.79. Pharmacy has been consulted to start heparin for bridging due to possible need for cardioversion. CBC down today H/H- 7.7/25.7. Platelets are within normal limits at 206.  First heparin level returned supratherapeutic (0.90). Heparin dosing weight is 103.3 kg with poor renal function. I expect some of this level is due to poor metabolism of the bolus, and that the level would trend down on its own; however, I do not believe this level would decline to therapeutic range. As such, I will decrease heparin and recheck a level. No signs/symptoms of bleeding noted per nurse.   Plans to restart warfarin tonight per earlier note.  PTA warfarin dose: 2.5 mg on Tue/Wed/Th/Sat/Sun, no warfarin on Mon/Fri  *Dose verified with patient and wife  Goal of Therapy:  INR 2-3  Heparin level 0.3-0.7 units/ml Monitor platelets by anticoagulation protocol: Yes   Plan:  Decrease heparin to 1300 units/hr Check daily heparin level and CBC  Warfarin 2.5 mg x 1 tonight Daily INR  Monitor for signs and symptoms of bleeding  [name], [name], PharmD PGY1 Pharmacy Resident Pager: [phone] 01/17/2016,6:23 PM

## 2016-01-17 NOTE — Progress Notes (Signed)
Patient Name: [name] Date of Encounter: 01/17/2016  Primary Cardiologist: Viann Fish MD  Hospital Problem List     Active Problems:   CHF (congestive heart failure) (HCC)   Acute CHF (congestive heart failure) (HCC)   Pneumonia of right upper lobe due to infectious organism [facility])   History of complete heart block   Chronic anticoagulation   OSA (obstructive sleep apnea)   Pressure injury of skin     Subjective   Feels breathing is better. Still on Bipap. No chest pain  Inpatient Medications    Scheduled Meds: . amiodarone  150 mg Intravenous Once  . atorvastatin  40 mg Oral QHS  . azithromycin  250 mg Oral Daily  . chlorhexidine  15 mL Mouth Rinse BID  . Chlorhexidine Gluconate Cloth  6 each Topical Q0600  . cholecalciferol  2,000 Units Oral Daily  . ferrous sulfate  325 mg Oral Q breakfast  . folic acid  1 mg Oral Daily  . furosemide  160 mg Intravenous Q12H  . insulin aspart  0-9 Units Subcutaneous TID WC  . ipratropium-albuterol  3 mL Nebulization Q6H  . [START ON 01/18/2016] levothyroxine  37.5 mcg Oral QAC breakfast  . mouth rinse  15 mL Mouth Rinse q12n4p  . metoprolol  12.5 mg Oral BID  . mupirocin ointment  1 application Nasal BID  . pantoprazole  40 mg Oral Daily  . senna-docusate  2 tablet Oral QHS  . sertraline  100 mg Oral Daily  . sodium chloride flush  3 mL Intravenous Q12H  . thiamine  100 mg Oral Daily  . warfarin  2.5 mg Oral ONCE-1800  . Warfarin - Pharmacist Dosing Inpatient   Does not apply q1800   Continuous Infusions: . amiodarone     Followed by  . amiodarone    . heparin 1,500 Units/hr (01/17/16 1000)   PRN Meds: sodium chloride, acetaminophen, ondansetron (ZOFRAN) IV, sodium chloride flush   Vital Signs    Vitals:   01/17/16 0800 01/17/16 0825 01/17/16 0900 01/17/16 1000  BP: 132/68 120/78 116/63 133/77  Pulse: 69 64 66 67  Resp: (!) 22 (!) 22 18 (!) 23  Temp:  97.6 F (36.4 C)    TempSrc:  Oral    SpO2:  98% 98% 99% 100%  Weight:      Height:        Intake/Output Summary (Last 24 hours) at 01/17/16 1211 Last data filed at 01/17/16 1000  Gross per 24 hour  Intake           949.75 ml  Output             1575 ml  Net          -625.25 ml   Filed Weights   01/15/16 1554 01/16/16 0510 01/17/16 0342  Weight: 274 lb 11.2 oz (124.6 kg) 274 lb 3.2 oz (124.4 kg) 273 lb 14.4 oz (124.2 kg)    Physical Exam   General: obese male in no acute distress, requiring oxygen Head: Eyes PERRLA, No xanthomas. Normocephalic and atraumatic, oropharynx without edema or exudate.  Lungs: Resp regular and unlabored, diminished breath sound due to grith.  Heart: RRR no s3, s4, or murmurs..   Neck: No carotid bruits. No lymphadenopathy.  Unable to assess JVD due to grith Abdomen: Bowel sounds present, distended without masses or hernias noted. Msk:  No spine or cva tenderness. No weakness, no joint deformities or effusions. Extremities: No clubbing, cyanosis. 2+ BL  LE edema. DP/PT/Radials 2+ and equal bilaterally. Neuro: lethargic.  No focal deficits noted. Psych:  drowsy  Skin: multiple scattered bruise   Labs    CBC  Recent Labs  01/15/16 0849 01/17/16 0536  WBC 8.7 7.7  NEUTROABS 6.3  --   HGB 8.5* 7.7*  HCT 28.0* 25.7*  MCV 90.9 92.4  PLT 238 206   Basic Metabolic Panel  Recent Labs  01/16/16 0511 01/17/16 0536  NA 141 139  K 4.0 4.1  CL 107 104  CO2 23 23  GLUCOSE 43* 116*  BUN 69* 71*  CREATININE 3.73* 3.82*  CALCIUM 9.1 9.0   Liver Function Tests  Recent Labs  01/15/16 0849  AST 17  ALT 13*  ALKPHOS 82  BILITOT 0.6  PROT 7.6  ALBUMIN 2.9*   No results for input(s): LIPASE, AMYLASE in the last 72 hours. Cardiac Enzymes  Recent Labs  01/15/16 1528 01/15/16 2040  TROPONINI <0.03 <0.03   BNP Invalid input(s): POCBNP D-Dimer No results for input(s): DDIMER in the last 72 hours. Hemoglobin A1C  Recent Labs  01/15/16 2040  HGBA1C 6.4*   Fasting Lipid  Panel No results for input(s): CHOL, HDL, LDLCALC, TRIG, CHOLHDL, LDLDIRECT in the last 72 hours. Thyroid Function Tests  Recent Labs  01/15/16 1510  TSH 6.428*    Telemetry    Atrial fibrillation with V pacing - Personally Reviewed  ECG    V paced - Personally Reviewed  Radiology    Dg Chest 2 View  Result Date: 01/17/2016 CLINICAL DATA:  Shortness of breath and wheezing today. History of CHF, pneumonia, former smoker. EXAM: CHEST  2 VIEW COMPARISON:  PA and lateral chest x-ray of January 15, 2016 FINDINGS: The lungs are less well inflated today but the patient is also positioned in a lordotic fashion. Confluent alveolar opacity persists in the right upper lobe. Elsewhere the interstitial markings remain increased. The pulmonary vascularity is more prominent today and cardiac silhouette remains enlarged. Small amounts of pleural fluid blunt the left lateral and posterior costophrenic angles. The ICD is in stable position. IMPRESSION: CHF with pulmonary interstitial edema and small left pleural effusion. Right upper lobe pneumonia. Overall the appearance of the chest has deteriorated somewhat but a portion of this is related to the lordotic positioning. Electronically Signed   By: David  SwazilandJordan M.D.   On: 01/17/2016 08:01    Cardiac Studies     Patient Profile     [name] is a 70 y.o. male with a history of Chronic diastolic heart failure, chronic kidney disease stage IV, hypertensive heart disease, paroxysmal atrial fibrillation, hypothyroidism, hyperlipidemia, sleep apnea somewhat compliant with CPAP, diabetes with neuropathy and LLLB with transient CHB s/p PPM who is consulted for CHF. Dr. Donnie Ahoilley is on medical leave.  Assessment & Plan    1. Acute on chronic diastolic CHF. Exacerbated by Afib and CKD. Need more aggressive diuresis. On  lasix 160 mg IV bid. Modest response.  Monitor I/O, daily weight. Follow BMET closely. Creatinine has increased as expected with  diuresis.   2. Afib- s/p DCCV on 11/16. Appears to still be in AFib. Will interrogate pacemaker to confirm and see how long he was in NSR. Limited options from the standpoint of antiarrhythmic therapy. I recommend reloading with IV amiodarone and then high dose oral amiodarone. Consider repeat attempt at DCCV later next week once PNA treated, CHF improved and more amiodarone on board.  I think it is unlikely that he has amiodarone  toxicity at this point on current doses. He is a poor candidate for ablation. Continue anticoagulation with coumadin.  3. PNA by CXR. Plan per primary team. Currently on Bipap  4. CHB s/p pacemaker implant.   5. CKD stage 4. This is contributing to his volume overload. If renal function deteriorates further will need Nephrology to see. He is followed by Nephrology at [facility].  6. Hypertensive heart disease  7. DM with neuropathy  8. History of tobacco abuse.    Signed, Tajia Szeliga [place], MD  01/17/2016, 12:11 PM

## 2016-01-17 NOTE — Consult Note (Signed)
WOC Nurse wound consult note Reason for Consult: Consult requested for buttock wound.   Wound type: Left buttock with partial thickness wound; appearance and location are NOT consistent with a pressure injury. Pressure Ulcer POA: Yes Measurement: .2X.2X.1cm Wound bed: Pink and dry Drainage (amount, consistency, odor) Scant amt yellow drainage, no odor Periwound: Intact Dressing procedure/placement/frequency: Foam dressing to protect and promote healing.  Pt is frequently incontinent of stool and it is difficult to keep the location from becoming soiled. Please re-consult if further assistance is needed.  Thank-you,  Cammie Mcgeeawn Chay Mazzoni MSN, RN, CWOCN, CorwithWCN-AP, CNS [phone]

## 2016-01-18 ENCOUNTER — Inpatient Hospital Stay (HOSPITAL_COMMUNITY): Payer: Medicare HMO

## 2016-01-18 DIAGNOSIS — N189 Chronic kidney disease, unspecified: Secondary | ICD-10-CM

## 2016-01-18 DIAGNOSIS — E1165 Type 2 diabetes mellitus with hyperglycemia: Secondary | ICD-10-CM

## 2016-01-18 DIAGNOSIS — N179 Acute kidney failure, unspecified: Secondary | ICD-10-CM

## 2016-01-18 DIAGNOSIS — Z9981 Dependence on supplemental oxygen: Secondary | ICD-10-CM

## 2016-01-18 DIAGNOSIS — Z992 Dependence on renal dialysis: Secondary | ICD-10-CM

## 2016-01-18 DIAGNOSIS — R918 Other nonspecific abnormal finding of lung field: Secondary | ICD-10-CM

## 2016-01-18 LAB — CBC
HCT: 24.6 % — ABNORMAL LOW (ref 39.0–52.0)
HEMOGLOBIN: 7.5 g/dL — AB (ref 13.0–17.0)
MCH: 28.1 pg (ref 26.0–34.0)
MCHC: 30.5 g/dL (ref 30.0–36.0)
MCV: 92.1 fL (ref 78.0–100.0)
Platelets: 203 10*3/uL (ref 150–400)
RBC: 2.67 MIL/uL — AB (ref 4.22–5.81)
RDW: 18.9 % — ABNORMAL HIGH (ref 11.5–15.5)
WBC: 9 10*3/uL (ref 4.0–10.5)

## 2016-01-18 LAB — BASIC METABOLIC PANEL
Anion gap: 14 (ref 5–15)
BUN: 72 mg/dL — AB (ref 6–20)
CALCIUM: 9 mg/dL (ref 8.9–10.3)
CO2: 22 mmol/L (ref 22–32)
CREATININE: 3.95 mg/dL — AB (ref 0.61–1.24)
Chloride: 100 mmol/L — ABNORMAL LOW (ref 101–111)
GFR, EST AFRICAN AMERICAN: 16 mL/min — AB (ref >60)
GFR, EST NON AFRICAN AMERICAN: 14 mL/min — AB (ref >60)
Glucose, Bld: 178 mg/dL — ABNORMAL HIGH (ref 65–99)
Potassium: 3.9 mmol/L (ref 3.5–5.1)
SODIUM: 136 mmol/L (ref 135–145)

## 2016-01-18 LAB — GLUCOSE, CAPILLARY
GLUCOSE-CAPILLARY: 168 mg/dL — AB (ref 65–99)
GLUCOSE-CAPILLARY: 189 mg/dL — AB (ref 65–99)
GLUCOSE-CAPILLARY: 218 mg/dL — AB (ref 65–99)
GLUCOSE-CAPILLARY: 166 mg/dL — AB (ref 65–99)
Glucose-Capillary: 153 mg/dL — ABNORMAL HIGH (ref 65–99)
Glucose-Capillary: 184 mg/dL — ABNORMAL HIGH (ref 65–99)

## 2016-01-18 LAB — PROTIME-INR
INR: 1.61
PROTHROMBIN TIME: 19.3 s — AB (ref 11.4–15.2)

## 2016-01-18 LAB — ALT: ALT: 19 U/L (ref 17–63)

## 2016-01-18 LAB — HEPATITIS B SURFACE ANTIGEN: Hepatitis B Surface Ag: NEGATIVE

## 2016-01-18 MED ORDER — ALTEPLASE 2 MG IJ SOLR: 2.0000 mg | Freq: Once | INTRAMUSCULAR | Status: DC | PRN

## 2016-01-18 MED ORDER — WARFARIN SODIUM 2.5 MG PO TABS
2.5000 mg | ORAL_TABLET | Freq: Once | ORAL | Status: AC
  Administered 2016-01-18: 2.5 mg via ORAL
  Filled 2016-01-18 (×2): qty 1

## 2016-01-18 MED ORDER — PENTAFLUOROPROP-TETRAFLUOROETH EX AERO: 1.0000 "application " | INHALATION_SPRAY | CUTANEOUS | Status: DC | PRN

## 2016-01-18 MED ORDER — "THROMBI-PAD 3""X3"" EX PADS"
1.0000 | MEDICATED_PAD | Freq: Once | CUTANEOUS | Status: DC | PRN
  Filled 2016-01-18: qty 1

## 2016-01-18 MED ORDER — SODIUM CHLORIDE 0.9 % IV SOLN: 100.0000 mL | INTRAVENOUS | Status: DC | PRN

## 2016-01-18 MED ORDER — LIDOCAINE-PRILOCAINE 2.5-2.5 % EX CREA: 1.0000 "application " | TOPICAL_CREAM | CUTANEOUS | Status: DC | PRN

## 2016-01-18 MED ORDER — LIDOCAINE HCL (PF) 1 % IJ SOLN: 5.0000 mL | INTRAMUSCULAR | Status: DC | PRN

## 2016-01-18 MED ORDER — THROMBI-PAD 3"X3" EX PADS
1.0000 | MEDICATED_PAD | Freq: Once | CUTANEOUS | Status: DC
Start: 2016-01-18 — End: 2016-01-18

## 2016-01-18 MED ORDER — HEPARIN SODIUM (PORCINE) 1000 UNIT/ML DIALYSIS: 1000.0000 [IU] | INTRAMUSCULAR | Status: DC | PRN

## 2016-01-18 MED ORDER — LIDOCAINE HCL 2 % IJ SOLN
INTRAMUSCULAR | Status: AC
  Filled 2016-01-18: qty 20

## 2016-01-18 NOTE — Progress Notes (Signed)
PT Cancellation Note  Patient Details Name: [name] MRN: [id number] DOB: Sep 04, 1945   Cancelled Treatment:    Reason Eval/Treat Not Completed: Patient not medically ready (per nsg will hold at this time)   Fabio AsaDevon J Travarius Lange 01/18/2016, 11:30 AM Charlotte Crumbevon Suleika Donavan, PT DPT  [phone]

## 2016-01-18 NOTE — Progress Notes (Signed)
Mr. Zachary BennettBeliczky came to HD unit today. Dr. Lamar Laundry. [place]hertz surgically  inserted temporary R IJ Cath for hemodialysis aseptically and successfully. RIJ was then used to start pt's first hemodialysis after chest x-ray and Dr. Arlean HoppingSchertz confirmation of catheter's placement. HD was done without any issues. Net fluids removed: 4000 cc. Report was given to floor RN Alberteen Spindle(M Wright); and pt was returned to the unit assisted by RN and RT as pt was in BiPAP.

## 2016-01-18 NOTE — Progress Notes (Signed)
Patient Name: Zachary CleverlyMichael D Morfin Date of Encounter: 01/18/2016  Primary Cardiologist: Viann FishSpencer Tilley MD  Hospital Problem List     Principal Problem:   Acute on chronic diastolic CHF (congestive heart failure) (HCC) Active Problems:   Chronic kidney disease (CKD), stage III (moderate)   Cardiac pacemaker in situ   Hypothyroidism   CHF (congestive heart failure) (HCC)   Pneumonia of right upper lobe due to infectious organism [facility](HCC)   History of complete heart block   Chronic anticoagulation   OSA (obstructive sleep apnea)   Pressure injury of skin   AKI (acute kidney injury) (HCC)   Community acquired pneumonia of right upper lobe of lung (HCC)   COPD (chronic obstructive pulmonary disease) (HCC)     Subjective   Feels breathing is better. Still on Bipap. No chest pain. Starting HD  Inpatient Medications    Scheduled Meds: . atorvastatin  40 mg Oral QHS  . chlorhexidine  15 mL Mouth Rinse BID  . Chlorhexidine Gluconate Cloth  6 each Topical Q0600  . cholecalciferol  2,000 Units Oral Daily  . ferrous sulfate  325 mg Oral Q breakfast  . folic acid  1 mg Oral Daily  . furosemide  160 mg Intravenous Q6H  . insulin aspart  0-9 Units Subcutaneous TID WC  . ipratropium-albuterol  3 mL Nebulization Q6H  . levothyroxine  37.5 mcg Oral QAC breakfast  . mouth rinse  15 mL Mouth Rinse q12n4p  . metolazone  10 mg Oral BID  . metoprolol  12.5 mg Oral BID  . mupirocin ointment  1 application Nasal BID  . pantoprazole  40 mg Oral Daily  . senna-docusate  2 tablet Oral QHS  . sertraline  100 mg Oral Daily  . sodium chloride flush  3 mL Intravenous Q12H  . thiamine  100 mg Oral Daily  . warfarin  2.5 mg Oral ONCE-1800  . Warfarin - Pharmacist Dosing Inpatient   Does not apply q1800   Continuous Infusions: . amiodarone 30 mg/hr (01/18/16 1203)   PRN Meds: sodium chloride, sodium chloride, sodium chloride, acetaminophen, alteplase, heparin, lidocaine (PF),  lidocaine-prilocaine, ondansetron (ZOFRAN) IV, pentafluoroprop-tetrafluoroeth, sodium chloride flush, sodium chloride flush, THROMBI-PAD   Vital Signs    Vitals:   01/18/16 1430 01/18/16 1434 01/18/16 1500 01/18/16 1530  BP: (!) 124/50 (!) 130/59 (!) 132/53 (!) 141/58  Pulse: 63 62 62 61  Resp: (!) 23 20 (!) 23 (!) 23  Temp: 98.3 F (36.8 C)     TempSrc: Oral     SpO2: 100%     Weight: 244 lb 7.8 oz (110.9 kg)     Height:        Intake/Output Summary (Last 24 hours) at 01/18/16 1616 Last data filed at 01/18/16 1200  Gross per 24 hour  Intake           1250.8 ml  Output             1601 ml  Net           -350.2 ml   Filed Weights   01/16/16 0510 01/17/16 0342 01/18/16 1430  Weight: 274 lb 3.2 oz (124.4 kg) 273 lb 14.4 oz (124.2 kg) 244 lb 7.8 oz (110.9 kg)    Physical Exam   General: obese male in no acute distress, requiring oxygen Head: Eyes PERRLA, No xanthomas. Normocephalic and atraumatic, oropharynx without edema or exudate. Vasc cath in right neck Lungs: Resp regular and unlabored, diminished breath sound due to  grith.  Heart: RRR no s3, s4, or murmurs..   Neck: No carotid bruits. No lymphadenopathy.  Unable to assess JVD due to grith Abdomen: Bowel sounds present, distended without masses or hernias noted. Msk:  No spine or cva tenderness. No weakness, no joint deformities or effusions. Extremities: No clubbing, cyanosis. 2+ BL LE edema. DP/PT/Radials 2+ and equal bilaterally. Neuro: lethargic.  No focal deficits noted. Psych:  drowsy  Skin: multiple scattered bruise   Labs    CBC  Recent Labs  01/17/16 0536 01/18/16 0300  WBC 7.7 9.0  HGB 7.7* 7.5*  HCT 25.7* 24.6*  MCV 92.4 92.1  PLT 206 203   Basic Metabolic Panel  Recent Labs  01/17/16 0536 01/18/16 0300  NA 139 136  K 4.1 3.9  CL 104 100*  CO2 23 22  GLUCOSE 116* 178*  BUN 71* 72*  CREATININE 3.82* 3.95*  CALCIUM 9.0 9.0   Liver Function Tests  Recent Labs  01/18/16 1501  ALT  19   No results for input(s): LIPASE, AMYLASE in the last 72 hours. Cardiac Enzymes  Recent Labs  01/15/16 2040  TROPONINI <0.03   BNP Invalid input(s): POCBNP D-Dimer No results for input(s): DDIMER in the last 72 hours. Hemoglobin A1C  Recent Labs  01/15/16 2040  HGBA1C 6.4*   Fasting Lipid Panel No results for input(s): CHOL, HDL, LDLCALC, TRIG, CHOLHDL, LDLDIRECT in the last 72 hours. Thyroid Function Tests No results for input(s): TSH, T4TOTAL, T3FREE, THYROIDAB in the last 72 hours.  Invalid input(s): FREET3  Telemetry    Atrial fibrillation with V pacing - Personally Reviewed  ECG    V paced - Personally Reviewed  Radiology    Dg Chest 2 View  Result Date: 01/17/2016 CLINICAL DATA:  Shortness of breath and wheezing today. History of CHF, pneumonia, former smoker. EXAM: CHEST  2 VIEW COMPARISON:  PA and lateral chest x-ray of January 15, 2016 FINDINGS: The lungs are less well inflated today but the patient is also positioned in a lordotic fashion. Confluent alveolar opacity persists in the right upper lobe. Elsewhere the interstitial markings remain increased. The pulmonary vascularity is more prominent today and cardiac silhouette remains enlarged. Small amounts of pleural fluid blunt the left lateral and posterior costophrenic angles. The ICD is in stable position. IMPRESSION: CHF with pulmonary interstitial edema and small left pleural effusion. Right upper lobe pneumonia. Overall the appearance of the chest has deteriorated somewhat but a portion of this is related to the lordotic positioning. Electronically Signed   By: David  SwazilandJordan M.D.   On: 01/17/2016 08:01   Dg Chest Port 1 View  Result Date: 01/18/2016 CLINICAL DATA:  Central line placement EXAM: PORTABLE CHEST 1 VIEW COMPARISON:  01/18/2016 FINDINGS: Cardiomediastinal silhouette is stable. Right upper lobe airspace disease again noted. Persistent bilateral mild pulmonary edema. There is right IJ central  line with tip in upper SVC about T3 level. No pneumothorax. IMPRESSION: Right IJ central line with tip in upper SVC. No pneumothorax. Persistent airspace disease in right upper lobe. Mild pulmonary edema. Electronically Signed   By: Natasha MeadLiviu  Pop M.D.   On: 01/18/2016 14:57   Dg Chest Port 1 View  Result Date: 01/18/2016 CLINICAL DATA:  History of CHF EXAM: PORTABLE CHEST 1 VIEW COMPARISON:  Yesterday FINDINGS: Interstitial opacities that are generalized and congestive appearing, improved. There remains asymmetric right upper lobe airspace opacity. Chronic cardiopericardial enlargement. Dual-chamber pacer leads from the left. IMPRESSION: 1. Unchanged right upper lobe airspace disease. 2.  Pulmonary edema has likely improved from yesterday. Electronically Signed   By: Marnee Spring M.D.   On: 01/18/2016 10:15    Cardiac Studies     Patient Profile     MINORU CHAP is a 70 y.o. male with a history of Chronic diastolic heart failure, chronic kidney disease stage IV, hypertensive heart disease, paroxysmal atrial fibrillation, hypothyroidism, hyperlipidemia, sleep apnea somewhat compliant with CPAP, diabetes with neuropathy and LLLB with transient CHB s/p PPM who is consulted for CHF. Dr. Donnie Aho is on medical leave.  Assessment & Plan    1. Acute on chronic diastolic CHF. Exacerbated by Afib and CKD. Unresponsive to aggressive diuresis. Appreciate Nephrology assistance. Now on HD.   Monitor I/O, daily weight.   2. Afib- s/p DCCV on 11/16. Still in AFib.  Limited options from the standpoint of antiarrhythmic therapy. I recommend reloading with IV amiodarone over the weekend. Consider repeat attempt at DCCV later next week once PNA treated, CHF improved with HD and more amiodarone on board. He is a poor candidate for ablation. Continue anticoagulation with coumadin.  3. PNA by CXR. Plan per primary team. Currently on Bipap  4. CHB s/p pacemaker implant.   5. CKD stage 4. Initiating  HD  6. Hypertensive heart disease  7. DM with neuropathy  8. History of tobacco abuse.    Signed, Aundra Pung [place], MD  01/18/2016, 4:16 PM

## 2016-01-18 NOTE — Procedures (Signed)
  I was present at this dialysis session, have reviewed the session itself and made  appropriate changes Vinson Moselleob Shandelle Borrelli MD [facility] pager [phone]   01/18/2016, 1:46 PM

## 2016-01-18 NOTE — Progress Notes (Signed)
ANTICOAGULATION CONSULT NOTE   Pharmacy Consult for warfarin Indication: atrial fibrillation No Known Allergies  Patient Measurements: Height: 5\' 11"  (180.3 cm) Weight: 273 lb 14.4 oz (124.2 kg) IBW/kg (Calculated) : 75.3 Heparin Dosing Weight: 103.3 kg   Vital Signs: Temp: 98.1 F (36.7 C) (12/01 1205) Temp Source: Oral (12/01 1205) BP: 129/66 (12/01 1205) Pulse Rate: 66 (12/01 1356)  Labs:  Recent Labs  01/15/16 1528 01/15/16 2040 01/16/16 0511 01/17/16 0536 01/17/16 1713 01/18/16 0300  HGB  --   --   --  7.7*  --  7.5*  HCT  --   --   --  25.7*  --  24.6*  PLT  --   --   --  206  --  203  LABPROT 45.4*  --  49.3* 21.1*  --  19.3*  INR 4.68*  --  5.19* 1.79  --  1.61  HEPARINUNFRC  --   --   --   --  0.90*  --   CREATININE  --   --  3.73* 3.82*  --  3.95*  TROPONINI <0.03 <0.03  --   --   --   --     Estimated Creatinine Clearance: 23.4 mL/min (by C-G formula based on SCr of 3.95 mg/dL (H)).   Medical History: Past Medical History:  Diagnosis Date  . Cardiac pacemaker in situ 08/03/2012   Indications transient complete heart block Insertion 08/03/12 Dr. Graciela HusbandsKlein  LV lead Medtronic 5076 serial number [id number]  LA lead Medtronic 5076 serial number [id number] Medtronic MRI compatible pulse generator serial number [id number] H.       . Chronic diastolic heart failure (HCC)   . Chronic kidney disease (CKD), stage III (moderate) 08/02/2012  . 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 Hyperlipidemia   . Hypertensive heart disease   . Hypothyroidism   . 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 LBBB (left bundle branch block) 08/02/2012  . Obesity (BMI 30-39.9)      Assessment: [name] is a 70 yo male who was on warfarin PTA for afib . INR on admission was 4.68 and rose to 5.19 yesterday 11/29. Patient was given 5 mg of IV Vitamin K 11/29 due to elevated INR and brisk bleeding from IV site and hematuria. INR is subtherapeutic this AM at 1.79.    Heparin stopped overnight d/t bleeding. Bleeding appears to be resolving per primary. Will continue warfarin conservative therapy. Will watch cautiously with temp HD cath placed.   PTA warfarin dose: 2.5 mg on Tue/Wed/Th/Sat/Sun, no warfarin on Mon/Fri  *Dose verified with patient and wife  Goal of Therapy:  INR 2-3  Monitor platelets by anticoagulation protocol: Yes   Plan:  Warfarin 2.5 mg x 1 tonight Daily INR  Monitor for signs and symptoms of bleeding  Sheppard CoilFrank Maryl Blalock PharmD., BCPS Clinical Pharmacist Pager [phone] 01/18/2016 2:47 PM

## 2016-01-18 NOTE — Procedures (Signed)
Under sterile conditions and using US guidance, a 15 cm tri-lumen Trialysis HD cath was placed into the R int jugular vein on the second attempt.  No apparent complications, no arterial stick noted. Patient stable post procedure, conversing, no chg inc'd RR rate. Will place back on bipap now and when CXR confirms placement will begin HD.   Vinson Moselleob Lamond Glantz MD [facility] pgr [phone]   01/18/2016, 1:46 PM

## 2016-01-18 NOTE — Progress Notes (Signed)
Pt transported on Bipap from  Dialysis to 4N05. Vitals stable through out. Patient placed on [place] 4 Lpm with humidity once back ibn room. SAt 92%.  RN aware.

## 2016-01-18 NOTE — Progress Notes (Signed)
Was paged by RN that patient was bleeding from 1 IV site and was advised to d/c Heparin. Another page was received approximately 40 minutes later stating that the IV had been removed and she was unable to stop bleeding at the site despite 30 minutes of pressure. Dr. Earlene PlaterWallace and myself went to evaluate the patient shortly after receiving the page. STAT CBC and PT/INR were ordered.   Upon arrival, a pressure dressing had been applied to IV site on right forearm and bleeding had been controlled. The patient was resting comfortably in bed and was in no acute distress. Was on BiPAP and tolerating well. The patient denied any symptoms of increased fatigue, SOB or LH and reported he feels unchanged. Was also notified that pt was having hematuria + clots and cath bag did demonstrate gross hematuria however without apparent clots. Per RN this hematuria was present last night as well and has not changed.   Plan: This is a 70 y/o M with Mhx of CHF, DM2 and AFIb a/c with Coumadin. Initially with a supratherapeutic INR + Bleeding from IV site. Was reversed with IV Vit K and was started on Heparin until INR within therapeutic range. Pt was restarted on Coumadin 11/30 and continued on Heparin until INR >2 however had bleeding from IV site this evening + hematuria (present x2 days). Pt denies any constitutional symptoms. STAT CBC and PT/INR were ordered for evaluation. Suspect reversal is not indicated at this time however will transfuse depending on results.

## 2016-01-18 NOTE — Progress Notes (Signed)
Upon arrival, patient's lower dentures were found broken on the floor.  Patient had removed them and attempted to place on bedside table.    Dentures (lower) placed in denture cup and biohazard bag with patient label. Left in room.   Frutoso ChaseKristen M Arleigh Dicola, RN

## 2016-01-18 NOTE — Progress Notes (Signed)
Internal Medicine Attending:   I saw and examined the patient. I reviewed the resident's note and I agree with the resident's findings and plan as documented in the resident's note. Patient states that his breathing is improved since admission but still requiring BiPAP intermittently and increased FiO2 when off BiPAP. He does complain of occasional productive cough with white phlegm. No fevers or chills. Patient with poor urine output despite aggressive diuresis and still requiring BiPAP intermittently. Nephrology follow-up appreciated. Patient to be started on hemodialysis today given his worsening creatinine, decreased urine output despite aggressive diuresis and persistent hypoxia and pulmonary edema.  Patient with persistent right upper lobe infiltrate in addition to pulmonary edema noted on chest x-ray. Patient with no evidence of infection as his white count is normal, his pro-calcitonin is normal and he has no fevers and has only occasional productive cough with whitish phlegm. We'll discontinue azithromycin at this time. We will image him further with a CT chest once he is stable enough to undergo this to further clarify this right upper lobe infiltrate.  Patient remains in atrial fibrillation. Cardiology follow-up appreciated. We'll continue with amiodarone for now consider DCCV next week if remains in atrial fibrillation. Continue with warfarin per pharmacy. We'll hold off on heparin GTT at this time given his recurrent episodes of bleeding from his IV while on this.

## 2016-01-18 NOTE — Progress Notes (Signed)
[facility] Progress Note   Subjective: no new c/o, still SOB. CXR no sig change  Vitals:   01/18/16 0816 01/18/16 0900 01/18/16 0915 01/18/16 1000  BP: (!) 147/58 (!) 138/54  (!) 146/74  Pulse: 67 62 61 62  Resp: 20 19 20  (!) 21  Temp:      TempSrc:      SpO2: 97% 99% 95% 91%  Weight:      Height:        Inpatient medications: . atorvastatin  40 mg Oral QHS  . azithromycin  250 mg Oral Daily  . chlorhexidine  15 mL Mouth Rinse BID  . Chlorhexidine Gluconate Cloth  6 each Topical Q0600  . cholecalciferol  2,000 Units Oral Daily  . ferrous sulfate  325 mg Oral Q breakfast  . folic acid  1 mg Oral Daily  . furosemide  160 mg Intravenous Q6H  . insulin aspart  0-9 Units Subcutaneous TID WC  . ipratropium-albuterol  3 mL Nebulization Q6H  . levothyroxine  37.5 mcg Oral QAC breakfast  . mouth rinse  15 mL Mouth Rinse q12n4p  . metolazone  10 mg Oral BID  . metoprolol  12.5 mg Oral BID  . mupirocin ointment  1 application Nasal BID  . pantoprazole  40 mg Oral Daily  . senna-docusate  2 tablet Oral QHS  . sertraline  100 mg Oral Daily  . sodium chloride flush  3 mL Intravenous Q12H  . thiamine  100 mg Oral Daily  . Warfarin - Pharmacist Dosing Inpatient   Does not apply q1800   . amiodarone 30 mg/hr (01/18/16 1000)   sodium chloride, acetaminophen, ondansetron (ZOFRAN) IV, sodium chloride flush, sodium chloride flush, THROMBI-PAD  Exam: Gen obese WM, mild dyspnea at rest No rash, cyanosis or gangrene Sclera anicteric, throat clear   No jvd or bruits Chest bilat rales 1/3 up post    Cor dec'd BS no RG heard Abd soft ntnd no mass or ascites +bs , obese GU normal male MS no joint effusions or deformity Ext 1-2+ edema / no wounds or ulcers Neuro is alert, Ox 3 , nf, no asterixis    Assessment: 1. CKD stage 4, approaching stage 5 - not diuresing, still sig pulm edema no better and hypoxic off bipap, wts unchanged. Will plan HD today w temp cath.   2. Vol overload / pulm edema/ acute resp failure - as above 3. IDDM 4. Gen'd weakness / gait failure - over last 4 mos 5. AFib on amio 6. SP PPM   Plan - HD today. Check renal US and UA as well.    Vinson Moselleob Livingston Denner MD [facility] pager [phone]   01/18/2016, 10:43 AM    Recent Labs Lab 01/16/16 0511 01/17/16 0536 01/18/16 0300  NA 141 139 136  K 4.0 4.1 3.9  CL 107 104 100*  CO2 23 23 22   GLUCOSE 43* 116* 178*  BUN 69* 71* 72*  CREATININE 3.73* 3.82* 3.95*  CALCIUM 9.1 9.0 9.0    Recent Labs Lab 01/15/16 0849  AST 17  ALT 13*  ALKPHOS 82  BILITOT 0.6  PROT 7.6  ALBUMIN 2.9*    Recent Labs Lab 01/15/16 0849 01/17/16 0536 01/18/16 0300  WBC 8.7 7.7 9.0  NEUTROABS 6.3  --   --   HGB 8.5* 7.7* 7.5*  HCT 28.0* 25.7* 24.6*  MCV 90.9 92.4 92.1  PLT 238 206 203   Iron/TIBC/Ferritin/ %Sat No results found for: IRON, TIBC,  FERRITIN, IRONPCTSAT

## 2016-01-18 NOTE — Progress Notes (Addendum)
Subjective: Patient was evaluated this morning on rounds. He reports being taken off BiPAP this morning and placed on nasal cannula.  He states that his breathing has improved.  He states he has a productive cough occasionally with clear sputum.  He denies fever or chills, chest pain, or palpitations.  Objective:  Vital signs in last 24 hours: Vitals:   01/18/16 1000 01/18/16 1100 01/18/16 1200 01/18/16 1205  BP: (!) 146/74 (!) 131/58 129/66 129/66  Pulse: 62 64 63 63  Resp: (!) 21 (!) 23 (!) 21 20  Temp:    98.1 F (36.7 C)  TempSrc:    Oral  SpO2: 91% 92% 97% 98%  Weight:      Height:       Physical Exam  Constitutional:  Obese, on nasal cannula  Cardiovascular:  Irregularly Irregular  Pulmonary/Chest: Effort normal.  Wheezing throughout all lung fields Bibasilar crackles  Musculoskeletal: He exhibits edema.     Assessment/Plan:  Principal Problem:   Acute on chronic diastolic CHF (congestive heart failure) (HCC) Active Problems:   Chronic kidney disease (CKD), stage III (moderate)   Cardiac pacemaker in situ   Hypothyroidism   CHF (congestive heart failure) (HCC)   Pneumonia of right upper lobe due to infectious organism [facility](HCC)   History of complete heart block   Chronic anticoagulation   OSA (obstructive sleep apnea)   Pressure injury of skin   AKI (acute kidney injury) (HCC)   Community acquired pneumonia of right upper lobe of lung (HCC)   COPD (chronic obstructive pulmonary disease) (HCC)  Acute on chronic diastolic CHF Appreciate cardiology's recommendations. Patient appears volume overloaded on physical exam. He has 2+ pitting edema in his lower extremities bilaterally. He has been aggressively diuresed with 160 mg IV twice a day. Patient has catheter placed and yesterday net output was 355 ml.  Patient's worsening renal function is likely not allowing for proper diuresing.  Nephrology is following and will do temporary hemodialysis today.   During  admission patient did not have a fever or leukocytosis.  He has a productive cough with clear sputum.  Procalcitonin has been 0.10 and 0.14.  At this time I do not believe patient has a pneumonia.  His chest x-ray exhibits pulmonary edema and this is likely from his acute on chronic CHF.  Will discontinue azithromycin today.    - Hemodialysis  - discontinue azithromycin  Paroxysmal Atrial fibrillation Patient received cardioversion on 11/16.  Cardiology recommended increasing amiodarone yesterday and consider DCCV next week once patient was stable. Patient is currently in A. Fib with rate control.  Patient was on a heparin drip due to subtherapeutic INR.Marland Kitchen.  Overnight he had bleeding at the IV site and now heparin is being held. INR is currently 1.6 -Continue amiodarone   - discontinue heparin  - continue warfarin  Chronic kidney disease stage IV Appreciate nephrology's recommendations.  Patient's creatinine continues to slightly worsenin. It is currently 3.95 and on admission was 3.73. Patient continues to be on BiPAP with occasional attempts with nasal cannula. Patient's breathing is not significantly improving and he is not diuresing well.   Nephrology is following and plan is for hemodialysis today.   -Temporary hemodialysis  Type 2 diabetes  Glucose 170 -SSI with CBGs QID   Anemia of chronic disease Hb 7.5 from 8.5 on admit, normal MCV. Baseline is around 9. - Continue home ferrous sulfate - Trend CBC, transfusion threshold 7  HTN Currently normotensive at 122/67 - continue reduced home  metoprolol to 12.5mg  BID -continue to hold home amlodipine  HLD -continue home atorvastatin  Hypothyroidism: Last TSH 8.141 on 11/9 -Continue home Synthroid 25mcg   Dispo: Anticipated discharge in approximately 3 day(s).   Camelia PhenesJessica Ratliff Jeily Guthridge, DO 01/18/2016, 1:16 PM Pager: [phone]

## 2016-01-19 ENCOUNTER — Inpatient Hospital Stay (HOSPITAL_COMMUNITY): Payer: Medicare HMO

## 2016-01-19 LAB — URINALYSIS, ROUTINE W REFLEX MICROSCOPIC
Glucose, UA: NEGATIVE mg/dL
KETONES UR: 15 mg/dL — AB
Nitrite: POSITIVE — AB
PROTEIN: 100 mg/dL — AB
Specific Gravity, Urine: 1.018 (ref 1.005–1.030)
pH: 5 (ref 5.0–8.0)

## 2016-01-19 LAB — POCT I-STAT 3, ART BLOOD GAS (G3+)
ACID-BASE DEFICIT: 1 mmol/L (ref 0.0–2.0)
BICARBONATE: 23.3 mmol/L (ref 20.0–28.0)
O2 SAT: 79 %
PCO2 ART: 35.1 mmHg (ref 32.0–48.0)
PO2 ART: 41 mmHg — AB (ref 83.0–108.0)
TCO2: 24 mmol/L (ref 0–100)
pH, Arterial: 7.431 (ref 7.350–7.450)

## 2016-01-19 LAB — BASIC METABOLIC PANEL
ANION GAP: 12 (ref 5–15)
BUN: 49 mg/dL — ABNORMAL HIGH (ref 6–20)
CALCIUM: 8.8 mg/dL — AB (ref 8.9–10.3)
CO2: 24 mmol/L (ref 22–32)
Chloride: 99 mmol/L — ABNORMAL LOW (ref 101–111)
Creatinine, Ser: 3.42 mg/dL — ABNORMAL HIGH (ref 0.61–1.24)
GFR, EST AFRICAN AMERICAN: 19 mL/min — AB (ref >60)
GFR, EST NON AFRICAN AMERICAN: 17 mL/min — AB (ref >60)
Glucose, Bld: 200 mg/dL — ABNORMAL HIGH (ref 65–99)
POTASSIUM: 4.1 mmol/L (ref 3.5–5.1)
SODIUM: 135 mmol/L (ref 135–145)

## 2016-01-19 LAB — GLUCOSE, CAPILLARY
GLUCOSE-CAPILLARY: 225 mg/dL — AB (ref 65–99)
Glucose-Capillary: 163 mg/dL — ABNORMAL HIGH (ref 65–99)
Glucose-Capillary: 186 mg/dL — ABNORMAL HIGH (ref 65–99)
Glucose-Capillary: 195 mg/dL — ABNORMAL HIGH (ref 65–99)
Glucose-Capillary: 208 mg/dL — ABNORMAL HIGH (ref 65–99)
Glucose-Capillary: 221 mg/dL — ABNORMAL HIGH (ref 65–99)

## 2016-01-19 LAB — CBC
HEMATOCRIT: 24.7 % — AB (ref 39.0–52.0)
Hemoglobin: 7.5 g/dL — ABNORMAL LOW (ref 13.0–17.0)
MCH: 27.9 pg (ref 26.0–34.0)
MCHC: 30.4 g/dL (ref 30.0–36.0)
MCV: 91.8 fL (ref 78.0–100.0)
Platelets: 196 10*3/uL (ref 150–400)
RBC: 2.69 MIL/uL — AB (ref 4.22–5.81)
RDW: 19.3 % — ABNORMAL HIGH (ref 11.5–15.5)
WBC: 8.8 10*3/uL (ref 4.0–10.5)

## 2016-01-19 LAB — IRON AND TIBC
IRON: 35 ug/dL — AB (ref 45–182)
SATURATION RATIOS: 14 % — AB (ref 17.9–39.5)
TIBC: 244 ug/dL — AB (ref 250–450)
UIBC: 209 ug/dL

## 2016-01-19 LAB — URINE MICROSCOPIC-ADD ON

## 2016-01-19 LAB — PROTIME-INR
INR: 1.75
PROTHROMBIN TIME: 20.7 s — AB (ref 11.4–15.2)

## 2016-01-19 LAB — PROCALCITONIN: PROCALCITONIN: 0.31 ng/mL

## 2016-01-19 LAB — HEPATITIS B SURFACE ANTIBODY,QUALITATIVE: HEP B S AB: REACTIVE

## 2016-01-19 LAB — HEPATITIS B CORE ANTIBODY, TOTAL: Hep B Core Total Ab: POSITIVE — AB

## 2016-01-19 MED ORDER — DARBEPOETIN ALFA 100 MCG/0.5ML IJ SOSY
100.0000 ug | PREFILLED_SYRINGE | INTRAMUSCULAR | Status: DC
  Administered 2016-01-19 – 2016-01-26 (×2): 100 ug via INTRAVENOUS
  Filled 2016-01-19 (×2): qty 0.5

## 2016-01-19 MED ORDER — DARBEPOETIN ALFA 100 MCG/0.5ML IJ SOSY
PREFILLED_SYRINGE | INTRAMUSCULAR | Status: AC
  Filled 2016-01-19: qty 0.5

## 2016-01-19 MED ORDER — WARFARIN SODIUM 2.5 MG PO TABS
2.5000 mg | ORAL_TABLET | Freq: Once | ORAL | Status: AC
  Administered 2016-01-19: 2.5 mg via ORAL
  Filled 2016-01-19 (×2): qty 1

## 2016-01-19 NOTE — Procedures (Signed)
Pt removed from BiPAP and placed on 4L [place] by RN.

## 2016-01-19 NOTE — Progress Notes (Signed)
Report given to HD nurse, respiratory called and notified patient will be on bipap and respiratory will go with. Patient and family aware.

## 2016-01-19 NOTE — Evaluation (Signed)
Physical Therapy Evaluation Patient Details Name: [name] MRN: [id number] DOB: 26-Mar-1945 Today's Date: 01/19/2016   History of Present Illness  Pt is a 70 y/o male admitted secondary to abdominal tightness and SOB, found to have pneumonia. PMH including but not limited to CHF, CKD, DM and pacemaker placement in 2014.  Clinical Impression  Pt presented supine in bed, initially asleep and requiring tactile stimuli to increase arousal/alertness. Prior to admission, pt's wife stated that he ambulated short distances with RW and uses a w/c for long distances. Pt would continue to benefit from skilled physical therapy services at this time while admitted and after d/c to address his below listed limitations in order to improve his overall safety and independence with functional mobility.     Follow Up Recommendations SNF (potential for home if able to progress)    Equipment Recommendations  None recommended by PT    Recommendations for Other Services       Precautions / Restrictions Precautions Precautions: Fall Restrictions Weight Bearing Restrictions: No      Mobility  Bed Mobility Overal bed mobility: Needs Assistance Bed Mobility: Supine to Sit;Sit to Supine     Supine to sit: Min assist;HOB elevated Sit to supine: Min guard   General bed mobility comments: pt required tactile and VC'ing for initiation secondary to lethargy, min A to bring bilateral LEs off of bed to achieve sitting.  Transfers Overall transfer level: Needs assistance Equipment used: Rolling walker (2 wheeled) Transfers: Sit to/from Stand Sit to Stand: Min assist;+2 safety/equipment         General transfer comment: pt required increased time, VC'ing for bilateral hand placement and min A to achieve standing from bed. (tech present for safety)  Ambulation/Gait             General Gait Details: NT as pt was on BiPAP and needed a SLP evaluation prior to needing to go off of the floor  for a procedure. Pt was able to take two side steps towards head of bed prior to sitting down.  Stairs            Wheelchair Mobility    Modified Rankin (Stroke Patients Only)       Balance Overall balance assessment: Needs assistance Sitting-balance support: Feet supported;No upper extremity supported Sitting balance-Leahy Scale: Fair     Standing balance support: During functional activity;Bilateral upper extremity supported Standing balance-Leahy Scale: Poor Standing balance comment: pt reliant on bilateral UEs on RW                             Pertinent Vitals/Pain Pain Assessment: No/denies pain   All VSS throughout.    Home Living Family/patient expects to be discharged to:: Private residence Living Arrangements: Spouse/significant other Available Help at Discharge: Family;Available 24 hours/day Type of Home: House Home Access: Ramped entrance     Home Layout: One level Home Equipment: Wheelchair - [employer] Corporationmanual;Walker - 2 wheels;Shower seat;Grab bars - toilet;Grab bars - tub/shower;Hand held shower head      Prior Function Level of Independence: Independent with assistive device(s)         Comments: pt's wife reported that he was ambulating short distances with use of RW     Hand Dominance        Extremity/Trunk Assessment   Upper Extremity Assessment: Overall WFL for tasks assessed           Lower Extremity Assessment: Generalized weakness  Communication   Communication: No difficulties  Cognition Arousal/Alertness: Lethargic Behavior During Therapy: WFL for tasks assessed/performed Overall Cognitive Status: Within Functional Limits for tasks assessed                      General Comments      Exercises General Exercises - Lower Extremity Long Arc Quad: AROM;Both;20 reps;Seated   Assessment/Plan    PT Assessment Patient needs continued PT services  PT Problem List Decreased strength;Decreased activity  tolerance;Decreased balance;Decreased mobility;Decreased coordination;Decreased safety awareness;Cardiopulmonary status limiting activity          PT Treatment Interventions DME instruction;Gait training;Functional mobility training;Therapeutic activities;Therapeutic exercise;Balance training;Neuromuscular re-education;Patient/family education    PT Goals (Current goals can be found in the Care Plan section)  Acute Rehab PT Goals Patient Stated Goal: get OOB PT Goal Formulation: With patient/family Time For Goal Achievement: 02/02/16 Potential to Achieve Goals: Fair    Frequency Min 3X/week   Barriers to discharge        Co-evaluation               End of Session Equipment Utilized During Treatment: Gait belt;Oxygen (on BiPAP) Activity Tolerance: Patient limited by fatigue Patient left: in bed;with call bell/phone within reach;with family/visitor present;Other (comment) (SLP entering for evaluation) Nurse Communication: Mobility status         Time: [id number] PT Time Calculation (min) (ACUTE ONLY): 20 min   Charges:   PT Evaluation $PT Eval Moderate Complexity: 1 Procedure     PT G CodesAlessandra Bevels:        Sunny Gains M Buryl Bamber 01/19/2016, 11:23 AM Deborah ChalkJennifer Zidan Helget, PT, DPT [phone]

## 2016-01-19 NOTE — Progress Notes (Addendum)
[facility] Progress Note   Subjective: 4.5 kg off w HD yest, breathing better, CXR no better however  Vitals:   01/19/16 0800 01/19/16 0802 01/19/16 0900 01/19/16 1000  BP: (!) 134/58 (!) 134/58 (!) 127/48 (!) 154/67  Pulse: (!) 59 (!) 59 60 (!) 59  Resp: 15 20 19  (!) 23  Temp:  98.2 F (36.8 C)    TempSrc:  Axillary    SpO2: 100% 100% 100% 95%  Weight:      Height:        Inpatient medications: . atorvastatin  40 mg Oral QHS  . chlorhexidine  15 mL Mouth Rinse BID  . Chlorhexidine Gluconate Cloth  6 each Topical Q0600  . cholecalciferol  2,000 Units Oral Daily  . ferrous sulfate  325 mg Oral Q breakfast  . folic acid  1 mg Oral Daily  . insulin aspart  0-9 Units Subcutaneous TID WC  . ipratropium-albuterol  3 mL Nebulization Q6H  . levothyroxine  37.5 mcg Oral QAC breakfast  . mouth rinse  15 mL Mouth Rinse q12n4p  . metoprolol  12.5 mg Oral BID  . mupirocin ointment  1 application Nasal BID  . pantoprazole  40 mg Oral Daily  . senna-docusate  2 tablet Oral QHS  . sertraline  100 mg Oral Daily  . sodium chloride flush  3 mL Intravenous Q12H  . thiamine  100 mg Oral Daily  . Warfarin - Pharmacist Dosing Inpatient   Does not apply q1800   . amiodarone 30 mg/hr (01/19/16 0800)   sodium chloride, acetaminophen, ondansetron (ZOFRAN) IV, sodium chloride flush, sodium chloride flush, THROMBI-PAD  Exam: Gen obese WM, on nasal cannula today No rash, cyanosis or gangrene Sclera anicteric, throat clear   No jvd or bruits Chest bilat rales 1/3 up post    Cor difficult to hear, no RG heard Abd soft ntnd no mass or ascites +bs , obese GU normal male MS no joint effusions or deformity Ext diffuse moderate pitting edema LE > UE's  bilat Neuro is alert, Ox 3 , nf, no asterixis  UA turbid, many bact, 15 ketone, +nit/ mod LE, tntc RBC, 6-30 wbc Renal US 11-12 cm kidneys, no hydro  Assessment: 1. CKD stage 4/5 - required HD for vol overload, possibly was  uremic as well, hard to tell yet 2. Anasarca/ pulm edema - cont to lower vol w HD 3. IDDM 4. AFib on amio 5. SP PPM 6. Hx diast CHF 7. Pyuria- sending UCx. Foley in place, will dc 8. Anemia of CKD - check Fe, start ESA, Hb 7-8   Plan - serial HD for vol overload, poss uremia. HD today   Vinson Moselleob Maegen Wigle MD [facility] pager [phone]   01/19/2016, 10:58 AM    Recent Labs Lab 01/17/16 0536 01/18/16 0300 01/19/16 0423  NA 139 136 135  K 4.1 3.9 4.1  CL 104 100* 99*  CO2 23 22 24   GLUCOSE 116* 178* 200*  BUN 71* 72* 49*  CREATININE 3.82* 3.95* 3.42*  CALCIUM 9.0 9.0 8.8*    Recent Labs Lab 01/15/16 0849 01/18/16 1501  AST 17  --   ALT 13* 19  ALKPHOS 82  --   BILITOT 0.6  --   PROT 7.6  --   ALBUMIN 2.9*  --     Recent Labs Lab 01/15/16 0849 01/17/16 0536 01/18/16 0300 01/19/16 0423  WBC 8.7 7.7 9.0 8.8  NEUTROABS 6.3  --   --   --  HGB 8.5* 7.7* 7.5* 7.5*  HCT 28.0* 25.7* 24.6* 24.7*  MCV 90.9 92.4 92.1 91.8  PLT 238 206 203 196   Iron/TIBC/Ferritin/ %Sat No results found for: IRON, TIBC, FERRITIN, IRONPCTSAT

## 2016-01-19 NOTE — Progress Notes (Signed)
Subjective: Patient was evaluated this morning. He was laying comfortably in bed on BiPAP. He had dialysis yesterday and said it was boring and uneventful. He states that his breathing continues to improve.  He denies any fever/chills, palpitations or chest pain.  Objective:  Vital signs in last 24 hours: Vitals:   01/19/16 0100 01/19/16 0200 01/19/16 0332 01/19/16 0342  BP: (!) 131/53 (!) 140/50 135/64   Pulse: (!) 58 (!) 58 60   Resp: 20 (!) 21 (!) 22   Temp: 99 F (37.2 C)  98.5 F (36.9 C)   TempSrc: Axillary  Oral   SpO2: 96% 96% 95% 95%  Weight:   261 lb 0.4 oz (118.4 kg)   Height:       Physical Exam  Constitutional:  On bipap  Cardiovascular:  Irregularly irregular  Pulmonary/Chest: Effort normal.  Wheezing throughout all lung fields bibasilar crackles  Abdominal: Soft. He exhibits no distension. There is no tenderness.  Musculoskeletal:  Compression stockings on lower extremities bilaterally No pitting edema noted  Skin: Skin is warm and dry.  Psychiatric: Affect and judgment normal.     Assessment/Plan:  Principal Problem:   Acute on chronic diastolic CHF (congestive heart failure) (HCC) Active Problems:   Chronic kidney disease (CKD), stage III (moderate)   Cardiac pacemaker in situ   Hypothyroidism   CHF (congestive heart failure) (HCC)   Pneumonia of right upper lobe due to infectious organism [facility](HCC)   History of complete heart block   Chronic anticoagulation   OSA (obstructive sleep apnea)   Pressure injury of skin   AKI (acute kidney injury) (HCC)   Community acquired pneumonia of right upper lobe of lung (HCC)   COPD (chronic obstructive pulmonary disease) (HCC)   Acute on chronic renal failure (HCC)  Acute on chronic diastolic CHF Patient received hemodialysis yesterday due to continued shortness of breath and not diuresing well despite aggressive IV 160 BID lasix.  His weight prior to dialysis was 244lb and post dialysis was 235lb. He states  that his breathing continues to improve. Will need to consider CT chest to further clarify his right upper lobe infiltrate on X-ray yesterday once patient's breathing is stable. - Hemodialysis  - Monitor I/Os - daily weights  Atrial fibrillation  S/P DCCV on 11/16.  Appreciate cardiology's recommendations.  Recommendations included reloading patient with IV amiodarone over the weekend and consider DCCV later next week once patient's pulmonary issues are stable. Patient is currently in A. Fib with heart rates in the 60s.  INR is 1.75 and warfarin is on board.  Patient has had bleeding at the IV site during admission, last time being two nights ago. Patient denies any bleeding episodes over night. - reload amiodarone over the weekend - continue warfarin  Chronic kidney disease stage IV Appreciate nephrology's recommendations.  Patient had a tri-lumen trialysis HD cath placed in R int jugular vein yesterday and completed hemodialysis yesterday.  Creatinine has improved from 3.95 to 3.42 this morning.  Patient's breathing is improving, but continues to require bipap. -Temporary hemodialysis  Type 2 diabetes  Glucose 200  -SSI with CBGs QID   Anemia of chronic disease Hb 7.5 and stable.  8.5 on admit,normal MCV. Baseline is around 9. - Continue home ferrous sulfate - Trend CBC, transfusion threshold 7  HTN 135/64 and stable - continue reduced home metoprolol to 12.5mg  BID - continue to hold home amlodipine  HLD -continue home atorvastatin  Hypothyroidism: Last TSH 8.141 on 11/9 -Continue home  Synthroid 25mcg   Dispo: Anticipated discharge in approximately 2 day(s).   Stephanine Reas RatlifCamelia Phenesf Javante Nilsson, DO 01/19/2016, 7:21 AM Pager: [phone]

## 2016-01-19 NOTE — Evaluation (Signed)
Clinical/Bedside Swallow Evaluation Patient Details  Name: [name] MRN: [id number] Date of Birth: 03/06/1945  Today's Date: 01/19/2016 Time: SLP Start Time (ACUTE ONLY): 40980952 SLP Stop Time (ACUTE ONLY): 1018 SLP Time Calculation (min) (ACUTE ONLY): 26 min  Past Medical History:  Past Medical History:  Diagnosis Date  . Cardiac pacemaker in situ 08/03/2012   Indications transient complete heart block Insertion 08/03/12 Dr. Graciela HusbandsKlein  LV lead Medtronic 5076 serial number [id number]  LA lead Medtronic 5076 serial number [id number] Medtronic MRI compatible pulse generator serial number [id number].       . Chronic diastolic heart failure (HCC)   . Chronic kidney disease (CKD), stage III (moderate) 08/02/2012  . 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 Hyperlipidemia   . Hypertensive heart disease   . Hypothyroidism   . 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 LBBB (left bundle branch block) 08/02/2012  . Obesity (BMI 30-39.9)    Past Surgical History:  Past Surgical History:  Procedure Laterality Date  . CARDIOVERSION N/A 01/03/2016   Procedure: CARDIOVERSION;  Surgeon: Laurey Moralealton S McLean, MD;  [facility];  Service: Cardiovascular;  Laterality: N/A;  . PERMANENT PACEMAKER INSERTION N/A 08/03/2012   Procedure: PERMANENT PACEMAKER INSERTION;  Surgeon: Duke SalviaSteven C Klein, MD;  [facility];  Service: Cardiovascular;  Laterality: N/A;   HPI:  The patient is a 70 y.o.year-old with history of DM, HTN, afib , diast CHF, HL and obesity, has CKD f/b kidney specialist [facility] 6 mos, admitted with SOB and decomp CHF on 01/15/16. Getting high dose diuretics and still SOB. Chest x ray revealed. Lungs hypoexpanded. Patchy bilateral airspace opacification raises   Assessment / Plan / Recommendation Clinical Impression  Pt presents with a mild oropharyngeal dysphagia which is impacted by his decreased respiratory status. Oxygen saturation  remained above 90 with use of nasal cannula during BSE, however RN reports pt alternating between Bipap and nasal cannula to maintain O2 saturation. Increased fatigue noted coordinating respiration and swallowing which was aided by frequent breaks. Delayed cough exhibited x1 following thin liquids by straw. Pt with baseline use of upper and lower dentures, lower dentures reported to have gotten broken last night which impeded adequate masticaiton of solid PO. Recommend dysphagia 3 (mechanical soft) and thin liquids with medicines whole in puree. ST to continue to monitor for diet tolerance.      Aspiration Risk  Moderate aspiration risk;Mild aspiration risk    Diet Recommendation  Dysphagia 3 (mechanical soft) and thin liquids    Medication Administration: Whole meds with puree    Other  Recommendations Oral Care Recommendations: Oral care BID   Follow up Recommendations        Frequency and Duration min 2x/week  1 week       Prognosis Prognosis for Safe Diet Advancement: Good Barriers to Reach Goals: Time post onset      Swallow Study   General Date of Onset: 01/15/16 HPI: The patient is a 70 y.o.year-old with history of DM, HTN, afib , diast CHF, HL and obesity, has CKD f/b kidney specialist [facility] every 6 mos, admitted with SOB and decomp CHF on 01/15/16. Getting high dose diuretics and still SOB. Chest x ray revealed. Lungs hypoexpanded. Patchy bilateral airspace opacification raises Type of Study: Bedside Swallow Evaluation Previous Swallow Assessment: none on file Diet Prior to this Study: NPO Temperature Spikes Noted: No Respiratory Status: Other (comment) (Bipap ) History of Recent Intubation: No Behavior/Cognition:  Requires cueing;Lethargic/Drowsy;Cooperative Oral Cavity Assessment: Dry;Dried secretions Oral Care Completed by SLP: Yes Oral Cavity - Dentition: Dentures, top (bottom dentures broke last night ) Self-Feeding Abilities: Needs assist Patient  Positioning: Upright in bed Baseline Vocal Quality: Breathy;Low vocal intensity Volitional Cough: Weak Volitional Swallow: Able to elicit    Oral/Motor/Sensory Function Overall Oral Motor/Sensory Function: Within functional limits   Ice Chips Ice chips: Impaired Presentation: Spoon Oral Phase Functional Implications: Prolonged oral transit Pharyngeal Phase Impairments: Suspected delayed Swallow;Multiple swallows   Thin Liquid Thin Liquid: Impaired Presentation: Cup;Straw Oral Phase Functional Implications: Prolonged oral transit Pharyngeal  Phase Impairments: Suspected delayed Swallow;Multiple swallows;Throat Clearing - Delayed    Nectar Thick Nectar Thick Liquid: Not tested   Honey Thick Honey Thick Liquid: Not tested   Puree Puree: Within functional limits   Solid   GO   Solid: Impaired Oral Phase Impairments: Impaired mastication Oral Phase Functional Implications: Prolonged oral transit;Impaired mastication Pharyngeal Phase Impairments: Multiple swallows;Suspected delayed Swallow       Zachary Duoshelsea Sumney MA, CCC-SLP Acute Care Speech Language Pathologist    [name], [name] 01/19/2016,10:27 AM

## 2016-01-19 NOTE — Progress Notes (Signed)
ANTICOAGULATION CONSULT NOTE   Pharmacy Consult for warfarin Indication: atrial fibrillation No Known Allergies  Patient Measurements: Height: 5\' 11"  (180.3 cm) Weight: 263 lb 0.1 oz (119.3 kg) IBW/kg (Calculated) : 75.3 Heparin Dosing Weight: 103.3 kg   Vital Signs: Temp: 98.3 F (36.8 C) (12/02 1113) Temp Source: Axillary (12/02 0802) BP: 129/59 (12/02 1400) Pulse Rate: 59 (12/02 1400)  Labs:  Recent Labs  01/17/16 0536 01/17/16 1713 01/18/16 0300 01/19/16 0423  HGB 7.7*  --  7.5* 7.5*  HCT 25.7*  --  24.6* 24.7*  PLT 206  --  203 196  LABPROT 21.1*  --  19.3* 20.7*  INR 1.79  --  1.61 1.75  HEPARINUNFRC  --  0.90*  --   --   CREATININE 3.82*  --  3.95* 3.42*    Estimated Creatinine Clearance: 26.4 mL/min (by C-G formula based on SCr of 3.42 mg/dL (H)).   Medical History: Past Medical History:  Diagnosis Date  . Cardiac pacemaker in situ 08/03/2012   Indications transient complete heart block Insertion 08/03/12 Dr. Graciela HusbandsKlein  LV lead Medtronic 5076 serial number [id number]  LA lead Medtronic 5076 serial number [id number] Medtronic MRI compatible pulse generator serial number [id number] H.       . Chronic diastolic heart failure (HCC)   . Chronic kidney disease (CKD), stage III (moderate) 08/02/2012  . 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 Hyperlipidemia   . Hypertensive heart disease   . Hypothyroidism   . 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 LBBB (left bundle branch block) 08/02/2012  . Obesity (BMI 30-39.9)      Assessment: [name] is a 70 yo male who was on warfarin PTA for afib . INR on admission was 4.68 and rose to 5.19 on 11/29. Patient was given 5 mg of IV Vitamin K 11/29 due to elevated INR and brisk bleeding from IV site and hematuria.  Heparin stopped on 11/30 d/t bleeding. Bleeding appears to have resolved per primary. Will continue warfarin conservative therapy without heparin. INR today remains SUBtherapeutic but  starting to trend up to 1.75. Hgb remains low but stable.   PTA warfarin dose: 2.5 mg on Tue/Wed/Th/Sat/Sun, no warfarin on Mon/Fri  *Dose verified with patient and wife  Goal of Therapy:  INR 2-3  Monitor platelets by anticoagulation protocol: Yes   Plan:  Warfarin 2.5 mg x 1 tonight Daily INR  Monitor closely for further signs and symptoms of bleeding  [name] Bruno K. Bonnye FavaNicolsen, PharmD, BCPS, CPP Clinical Pharmacist Pager: [phone] Phone: [phone] 01/19/2016 2:54 PM

## 2016-01-19 NOTE — Progress Notes (Signed)
Paged by nurse regarding pt being more confused with increased work of breathing. Went to see the patient. He states that this work of breathing is what he experiences at baseline and he does not have any pain or complaints. He smoked 2 packs per day for 50 years, no PFTs in our system but he uses albuterol - ipratropium inhaler at home.  He is receiving a Duo- neb inhaler treatment and is on nasal canula. He has SpO2 95%, HR 60 with multiple PVCs on telemetry. He is AAOx4 he has abdominal breathing and diffuse wheezes on pulmonary exam.   Increased work of breathing may be multifactorial and related to COPD, pulmonary edema, and amiodarone use. Will order lab work and imaging and notify day team.   Ordered ABG, CBG, and chest xray

## 2016-01-20 DIAGNOSIS — N179 Acute kidney failure, unspecified: Secondary | ICD-10-CM

## 2016-01-20 DIAGNOSIS — N189 Chronic kidney disease, unspecified: Secondary | ICD-10-CM

## 2016-01-20 DIAGNOSIS — I4891 Unspecified atrial fibrillation: Secondary | ICD-10-CM

## 2016-01-20 LAB — CBC
HCT: 25.4 % — ABNORMAL LOW (ref 39.0–52.0)
Hemoglobin: 7.6 g/dL — ABNORMAL LOW (ref 13.0–17.0)
MCH: 27.5 pg (ref 26.0–34.0)
MCHC: 29.9 g/dL — ABNORMAL LOW (ref 30.0–36.0)
MCV: 92 fL (ref 78.0–100.0)
PLATELETS: 203 10*3/uL (ref 150–400)
RBC: 2.76 MIL/uL — ABNORMAL LOW (ref 4.22–5.81)
RDW: 19.9 % — AB (ref 11.5–15.5)
WBC: 9.4 10*3/uL (ref 4.0–10.5)

## 2016-01-20 LAB — BASIC METABOLIC PANEL
ANION GAP: 12 (ref 5–15)
BUN: 36 mg/dL — ABNORMAL HIGH (ref 6–20)
CALCIUM: 8.8 mg/dL — AB (ref 8.9–10.3)
CO2: 24 mmol/L (ref 22–32)
CREATININE: 3.64 mg/dL — AB (ref 0.61–1.24)
Chloride: 97 mmol/L — ABNORMAL LOW (ref 101–111)
GFR, EST AFRICAN AMERICAN: 18 mL/min — AB (ref >60)
GFR, EST NON AFRICAN AMERICAN: 16 mL/min — AB (ref >60)
Glucose, Bld: 265 mg/dL — ABNORMAL HIGH (ref 65–99)
Potassium: 4.2 mmol/L (ref 3.5–5.1)
SODIUM: 133 mmol/L — AB (ref 135–145)

## 2016-01-20 LAB — CULTURE, BLOOD (ROUTINE X 2)
Culture: NO GROWTH
Culture: NO GROWTH

## 2016-01-20 LAB — URINE CULTURE: CULTURE: NO GROWTH

## 2016-01-20 LAB — PROTIME-INR
INR: 2.18
PROTHROMBIN TIME: 24.6 s — AB (ref 11.4–15.2)

## 2016-01-20 LAB — GLUCOSE, CAPILLARY: Glucose-Capillary: 253 mg/dL — ABNORMAL HIGH (ref 65–99)

## 2016-01-20 MED ORDER — WARFARIN SODIUM 1 MG PO TABS
1.0000 mg | ORAL_TABLET | Freq: Once | ORAL | Status: AC
  Administered 2016-01-20: 1 mg via ORAL
  Filled 2016-01-20: qty 1
  Filled 2016-01-20: qty 0.5

## 2016-01-20 MED ORDER — ORAL CARE MOUTH RINSE
15.0000 mL | Freq: Two times a day (BID) | OROMUCOSAL | Status: DC
  Administered 2016-01-20 – 2016-02-01 (×19): 15 mL via OROMUCOSAL

## 2016-01-20 NOTE — Progress Notes (Signed)
ANTICOAGULATION CONSULT NOTE   Pharmacy Consult for warfarin Indication: atrial fibrillation No Known Allergies  Patient Measurements: Height: 5\' 11"  (180.3 cm) Weight: 249 lb 9 oz (113.2 kg) IBW/kg (Calculated) : 75.3 Heparin Dosing Weight: 103.3 kg   Vital Signs: Temp: 98.9 F (37.2 C) (12/03 0747) Temp Source: Axillary (12/03 0747) BP: 143/52 (12/03 0747) Pulse Rate: 61 (12/03 0747)  Labs:  Recent Labs  01/17/16 1713  01/18/16 0300 01/19/16 0423 01/20/16 0450  HGB  --   < > 7.5* 7.5* 7.6*  HCT  --   --  24.6* 24.7* 25.4*  PLT  --   --  203 196 203  LABPROT  --   --  19.3* 20.7* 24.6*  INR  --   --  1.61 1.75 2.18  HEPARINUNFRC 0.90*  --   --   --   --   CREATININE  --   --  3.95* 3.42* 3.64*  < > = values in this interval not displayed.  Estimated Creatinine Clearance: 24.2 mL/min (by C-G formula based on SCr of 3.64 mg/dL (H)).   Medical History: Past Medical History:  Diagnosis Date  . Cardiac pacemaker in situ 08/03/2012   Indications transient complete heart block Insertion 08/03/12 Dr. Graciela HusbandsKlein  LV lead Medtronic 5076 serial number [id number]  LA lead Medtronic 5076 serial number [id number] Medtronic MRI compatible pulse generator serial number [id number] H.       . Chronic diastolic heart failure (HCC)   . Chronic kidney disease (CKD), stage III (moderate) 08/02/2012  . 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 Hyperlipidemia   . Hypertensive heart disease   . Hypothyroidism   . 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 LBBB (left bundle branch block) 08/02/2012  . Obesity (BMI 30-39.9)      Assessment: [name] is a 70 yo male who was on warfarin PTA for afib . INR on admission was 4.68 and rose to 5.19 on 11/29. Patient was given 5 mg of IV Vitamin K 11/29 due to elevated INR and brisk bleeding from IV site and hematuria.  Heparin stopped on 11/30 d/t bleeding. Bleeding appears to have resolved per primary. Will continue warfarin  conservative therapy without heparin. INR has trended up today to goal at 2.18. Hgb remains low but stable. Diet restarted today.  PTA warfarin dose: 2.5 mg on Tue/Wed/Th/Sat/Sun, no warfarin on Mon/Fri  *Dose verified with patient and wife  Goal of Therapy:  INR 2-3  Monitor platelets by anticoagulation protocol: Yes   Plan:  Warfarin 1 mg x 1 tonight Daily INR  Monitor closely for further signs and symptoms of bleeding  [name], PharmD, BCPS, CPP Clinical Pharmacist Pager: [phone] Phone: [phone] 01/20/2016 8:30 AM

## 2016-01-20 NOTE — Progress Notes (Signed)
   Subjective: Patient was evaluated this morning on rounds. He states that his breathing has improved and that he feels great.  Objective:  Vital signs in last 24 hours: Vitals:   01/20/16 1222 01/20/16 1443 01/20/16 1549 01/20/16 1600  BP: (!) 127/49  (!) 140/54   Pulse: 60  61 63  Resp: 20  19   Temp: 97.6 F (36.4 C)  97.8 F (36.6 C)   TempSrc: Oral  Oral   SpO2: 92% 95% 97% 94%  Weight:      Height:       Physical Exam  Constitutional:  On nasal cannula  Cardiovascular: Normal rate, regular rhythm and normal heart sounds.  Exam reveals no gallop and no friction rub.   No murmur heard. Pulmonary/Chest: Effort normal. He has wheezes.  Musculoskeletal:  1+ pitting edema on lower extremities bilaterally  Skin: Skin is warm and dry.    Assessment/Plan:  Principal Problem:   Acute on chronic diastolic CHF (congestive heart failure) (HCC) Active Problems:   Chronic kidney disease (CKD), stage III (moderate)   Cardiac pacemaker in situ   Hypothyroidism   CHF (congestive heart failure) (HCC)   Pneumonia of right upper lobe due to infectious organism [facility](HCC)   History of complete heart block   Chronic anticoagulation   OSA (obstructive sleep apnea)   Pressure injury of skin   AKI (acute kidney injury) (HCC)   Community acquired pneumonia of right upper lobe of lung (HCC)   COPD (chronic obstructive pulmonary disease) (HCC)   Acute on chronic renal failure (HCC)  Acute on chronic diastolic CHF Patient states that his breathing continues to improve.  Since admission patient has diuresed 5 L.  When in the room patient required 1 L of oxygen on nasal cannula.  BiPAP has been discontinued and he will require CPAP at night. - Hemodialysis  - Monitor I/Os - daily weights - CPAP QHS  Atrial fibrillation  S/P DCCV on 11/16. Appreciate cardiology's recommendations.   Patient has a regular rate and rhythm. Heart rates are in the  60s.  Warfarin is on board and INR is 2.18.   Patient denies any bleeding episodes over night. - continue amiodarone  - continue warfarin  Chronic kidney disease stage IV Appreciate nephrology's recommendations.  Patient had 4.5 kg off with hemodialysis yesterday. Creatinine is stable at 3.64.  Patient's breathing is improving.  -Temporary hemodialysis  Type 2 diabetes  Glucose 265 -SSI with CBGs QID   Anemia of chronic disease Hb 7.6 and stable.  8.5 on admit,normal MCV. Baseline is around 9. - Continue home ferrous sulfate - Trend CBC, transfusion threshold 7  HTN 140/54 and stable - continue reducedhome metoprolol to 12.5mg  BID - continue to hold home amlodipine  HLD -continue home atorvastatin  Hypothyroidism: Last TSH 8.141 on 11/9 -Continue home Synthroid 25mcg  Dispo: Anticipated discharge in approximately 2 day(s).   Camelia PhenesJessica Ratliff Tosh Glaze, DO 01/20/2016, 4:53 PM Pager: [phone]

## 2016-01-20 NOTE — Progress Notes (Signed)
Internal Medicine Attending  Date: 01/20/2016  Patient name: [name] Medical record number: [id number] Date of birth: 01/24/1946 Age: 70 y.o. Gender: male  I saw and evaluated the patient. I reviewed the resident's note by Dr. Mikey BussingHoffman and I agree with the resident's findings and plans as documented in her progress note.  When seen on rounds this morning the patient felt much improved from a breathing standpoint after the dialysis session yesterday. He was placed on BiPAP overnight but not per his request. When we walked into the room he was on 5 L by nasal cannula satting at 99%. He was lowered to 1 L per nasal cannula with sats in the 90-94 range after a long discussion on rounds. Therefore his oxygen requirements are markedly improved. I feel he is stable for transfer to the general medical ward on telemetry for further volume control with hemodialysis and tapering off of his supplemental oxygen therapy. The BiPAP will be discontinued but we will try to encourage use of chronic CPAP therapy which has been prescribed for him to be used at home.

## 2016-01-20 NOTE — Progress Notes (Signed)
Took pt off bipap and placed on 4L

## 2016-01-20 NOTE — Progress Notes (Signed)
[facility] Progress Note   Subjective: feeling better, no c/o  Vitals:   01/20/16 1600 01/20/16 1829 01/20/16 2008 01/20/16 2109  BP:  (!) 140/44 (!) 127/36   Pulse: 63 60 63   Resp:  20 19   Temp:  98.2 F (36.8 C) 98.7 F (37.1 C)   TempSrc:  Oral Oral   SpO2: 94% 100% 99% 99%  Weight:  115.4 kg (254 lb 6.6 oz)    Height:  5\' 11"  (1.803 m)      Inpatient medications: . atorvastatin  40 mg Oral QHS  . Chlorhexidine Gluconate Cloth  6 each Topical Q0600  . cholecalciferol  2,000 Units Oral Daily  . darbepoetin (ARANESP) injection - DIALYSIS  100 mcg Intravenous Q Sat-HD  . ferrous sulfate  325 mg Oral Q breakfast  . folic acid  1 mg Oral Daily  . insulin aspart  0-9 Units Subcutaneous TID WC  . ipratropium-albuterol  3 mL Nebulization Q6H  . levothyroxine  37.5 mcg Oral QAC breakfast  . mouth rinse  15 mL Mouth Rinse BID  . metoprolol  12.5 mg Oral BID  . pantoprazole  40 mg Oral Daily  . senna-docusate  2 tablet Oral QHS  . sertraline  100 mg Oral Daily  . sodium chloride flush  3 mL Intravenous Q12H  . thiamine  100 mg Oral Daily  . warfarin  1 mg Oral ONCE-1800  . Warfarin - Pharmacist Dosing Inpatient   Does not apply q1800   . amiodarone 30 mg/hr (01/20/16 1014)   sodium chloride, acetaminophen, ondansetron (ZOFRAN) IV, sodium chloride flush, sodium chloride flush, THROMBI-PAD  Exam: Gen obese WM, on nasal cannula today No rash, cyanosis or gangrene Sclera anicteric, throat clear   No jvd or bruits Chest R clear, L rales at the base Cor difficult to hear, no RG heard Abd soft ntnd no mass or ascites +bs , obese MS no joint effusions or deformity Ext LE edema sig improved bilat Neuro is alert, Ox 3 , nf, no asterixis  UA turbid, many bact, 15 ketone, +nit/ mod LE, tntc RBC, 6-30 wbc Renal US 11-12 cm kidneys, no hydro  Assessment: 1. Acute on CKD 4 - baseline creat 2.0 here 2014, f[facility]; creat here 3.1 >> 3.8  with attempted diuresis. Failed diuresis, now sp HD x 2 w nine liters removed and looks much better , off bipap and LE edema resolving.  Still looks pretty debilitated however, and not sure what his baseline is.   2. Anasarca/ pulm edema - sig improved 3. IDDM 4. AFib on amio 5. SP PPM 6. Hx diast CHF 7. Pyuria- sending UCx, dc'd foley 8. Anemia of CKD - check Fe, start ESA, Hb 7-8   Plan - HD tomorrow, get PT, mobilize, etc   Vinson Moselleob Chaye Misch MD [facility] pager [phone]   01/20/2016, 9:50 PM    Recent Labs Lab 01/18/16 0300 01/19/16 0423 01/20/16 0450  NA 136 135 133*  K 3.9 4.1 4.2  CL 100* 99* 97*  CO2 22 24 24   GLUCOSE 178* 200* 265*  BUN 72* 49* 36*  CREATININE 3.95* 3.42* 3.64*  CALCIUM 9.0 8.8* 8.8*    Recent Labs Lab 01/15/16 0849 01/18/16 1501  AST 17  --   ALT 13* 19  ALKPHOS 82  --   BILITOT 0.6  --   PROT 7.6  --   ALBUMIN 2.9*  --     Recent Labs  Lab 01/15/16 0849  01/18/16 0300 01/19/16 0423 01/20/16 0450  WBC 8.7  < > 9.0 8.8 9.4  NEUTROABS 6.3  --   --   --   --   HGB 8.5*  < > 7.5* 7.5* 7.6*  HCT 28.0*  < > 24.6* 24.7* 25.4*  MCV 90.9  < > 92.1 91.8 92.0  PLT 238  < > 203 196 203  < > = values in this interval not displayed. Iron/TIBC/Ferritin/ %Sat    Component Value Date/Time   IRON 35 (L) 01/19/2016 1720   TIBC 244 (L) 01/19/2016 1720   IRONPCTSAT 14 (L) 01/19/2016 1720

## 2016-01-21 DIAGNOSIS — N184 Chronic kidney disease, stage 4 (severe): Secondary | ICD-10-CM

## 2016-01-21 DIAGNOSIS — I482 Chronic atrial fibrillation: Secondary | ICD-10-CM

## 2016-01-21 DIAGNOSIS — Z7901 Long term (current) use of anticoagulants: Secondary | ICD-10-CM

## 2016-01-21 DIAGNOSIS — Z960 Presence of urogenital implants: Secondary | ICD-10-CM

## 2016-01-21 LAB — BASIC METABOLIC PANEL
Anion gap: 16 — ABNORMAL HIGH (ref 5–15)
BUN: 51 mg/dL — ABNORMAL HIGH (ref 6–20)
CHLORIDE: 96 mmol/L — AB (ref 101–111)
CO2: 19 mmol/L — ABNORMAL LOW (ref 22–32)
CREATININE: 5.34 mg/dL — AB (ref 0.61–1.24)
Calcium: 9 mg/dL (ref 8.9–10.3)
GFR, EST AFRICAN AMERICAN: 11 mL/min — AB (ref >60)
GFR, EST NON AFRICAN AMERICAN: 10 mL/min — AB (ref >60)
Glucose, Bld: 312 mg/dL — ABNORMAL HIGH (ref 65–99)
Potassium: 4.4 mmol/L (ref 3.5–5.1)
SODIUM: 131 mmol/L — AB (ref 135–145)

## 2016-01-21 LAB — RENAL FUNCTION PANEL
Albumin: 2.5 g/dL — ABNORMAL LOW (ref 3.5–5.0)
Anion gap: 13 (ref 5–15)
BUN: 19 mg/dL (ref 6–20)
CHLORIDE: 96 mmol/L — AB (ref 101–111)
CO2: 25 mmol/L (ref 22–32)
CREATININE: 3.38 mg/dL — AB (ref 0.61–1.24)
Calcium: 8.6 mg/dL — ABNORMAL LOW (ref 8.9–10.3)
GFR, EST AFRICAN AMERICAN: 20 mL/min — AB (ref >60)
GFR, EST NON AFRICAN AMERICAN: 17 mL/min — AB (ref >60)
Glucose, Bld: 206 mg/dL — ABNORMAL HIGH (ref 65–99)
PHOSPHORUS: 3.3 mg/dL (ref 2.5–4.6)
POTASSIUM: 3.6 mmol/L (ref 3.5–5.1)
Sodium: 134 mmol/L — ABNORMAL LOW (ref 135–145)

## 2016-01-21 LAB — GLUCOSE, CAPILLARY
GLUCOSE-CAPILLARY: 208 mg/dL — AB (ref 65–99)
GLUCOSE-CAPILLARY: 263 mg/dL — AB (ref 65–99)
Glucose-Capillary: 301 mg/dL — ABNORMAL HIGH (ref 65–99)

## 2016-01-21 LAB — CBC
HEMATOCRIT: 26.4 % — AB (ref 39.0–52.0)
Hemoglobin: 8.1 g/dL — ABNORMAL LOW (ref 13.0–17.0)
MCH: 28 pg (ref 26.0–34.0)
MCHC: 30.7 g/dL (ref 30.0–36.0)
MCV: 91.3 fL (ref 78.0–100.0)
Platelets: 193 10*3/uL (ref 150–400)
RBC: 2.89 MIL/uL — ABNORMAL LOW (ref 4.22–5.81)
RDW: 19.9 % — AB (ref 11.5–15.5)
WBC: 9.9 10*3/uL (ref 4.0–10.5)

## 2016-01-21 LAB — PROTIME-INR
INR: 2.29
Prothrombin Time: 25.6 seconds — ABNORMAL HIGH (ref 11.4–15.2)

## 2016-01-21 MED ORDER — HEPARIN SODIUM (PORCINE) 1000 UNIT/ML DIALYSIS
5000.0000 [IU] | INTRAMUSCULAR | Status: DC | PRN
  Filled 2016-01-21: qty 5

## 2016-01-21 MED ORDER — ALTEPLASE 2 MG IJ SOLR: 2.0000 mg | Freq: Once | INTRAMUSCULAR | Status: DC | PRN

## 2016-01-21 MED ORDER — LIDOCAINE-PRILOCAINE 2.5-2.5 % EX CREA
1.0000 "application " | TOPICAL_CREAM | CUTANEOUS | Status: DC | PRN
  Filled 2016-01-21: qty 5

## 2016-01-21 MED ORDER — INSULIN GLARGINE 100 UNIT/ML ~~LOC~~ SOLN
10.0000 [IU] | Freq: Every day | SUBCUTANEOUS | Status: DC
  Administered 2016-01-21: 10 [IU] via SUBCUTANEOUS
  Filled 2016-01-21 (×2): qty 0.1

## 2016-01-21 MED ORDER — PENTAFLUOROPROP-TETRAFLUOROETH EX AERO: 1.0000 "application " | INHALATION_SPRAY | CUTANEOUS | Status: DC | PRN

## 2016-01-21 MED ORDER — IPRATROPIUM-ALBUTEROL 0.5-2.5 (3) MG/3ML IN SOLN
3.0000 mL | Freq: Four times a day (QID) | RESPIRATORY_TRACT | Status: DC
  Administered 2016-01-21 (×3): 3 mL via RESPIRATORY_TRACT
  Filled 2016-01-21 (×4): qty 3

## 2016-01-21 MED ORDER — SODIUM CHLORIDE 0.9 % IV SOLN: 100.0000 mL | INTRAVENOUS | Status: DC | PRN

## 2016-01-21 MED ORDER — INSULIN ASPART 100 UNIT/ML ~~LOC~~ SOLN
0.0000 [IU] | Freq: Three times a day (TID) | SUBCUTANEOUS | Status: DC
  Administered 2016-01-21: 5 [IU] via SUBCUTANEOUS
  Administered 2016-01-22 – 2016-01-23 (×4): 8 [IU] via SUBCUTANEOUS
  Administered 2016-01-23 – 2016-01-24 (×4): 5 [IU] via SUBCUTANEOUS
  Administered 2016-01-25: 8 [IU] via SUBCUTANEOUS
  Administered 2016-01-25: 5 [IU] via SUBCUTANEOUS

## 2016-01-21 MED ORDER — LIDOCAINE HCL (PF) 1 % IJ SOLN: 5.0000 mL | INTRAMUSCULAR | Status: DC | PRN

## 2016-01-21 MED ORDER — FUROSEMIDE 10 MG/ML IJ SOLN
120.0000 mg | Freq: Two times a day (BID) | INTRAMUSCULAR | Status: DC
  Administered 2016-01-21 – 2016-01-22 (×3): 120 mg via INTRAVENOUS
  Filled 2016-01-21 (×4): qty 12

## 2016-01-21 MED ORDER — HEPARIN SODIUM (PORCINE) 1000 UNIT/ML DIALYSIS
1000.0000 [IU] | INTRAMUSCULAR | Status: DC | PRN
  Filled 2016-01-21: qty 1

## 2016-01-21 MED ORDER — WARFARIN SODIUM 2 MG PO TABS
1.0000 mg | ORAL_TABLET | Freq: Once | ORAL | Status: AC
  Administered 2016-01-21: 1 mg via ORAL
  Filled 2016-01-21: qty 0.5

## 2016-01-21 NOTE — Consult Note (Signed)
[facility]  Patient name: [name] MRN: [id number] DOB: 1945-06-23 Sex: male  REASON FOR CONSULT: permanent dialysis access, consult is from internal medicine teaching service.   HPI: [name] is a 70 y.o. male, who presents for evaluation for permanent dialysis. He is currently dialyzing via a right IJ temporary catheter. [facility] with shortness of breath secondary to CHF exacerbation. He was unable to diurese adequately and dialysis was started to remove excess fluid. The patient has CKD stage IV. It is uncertain whether his kidneys will recover but given advanced CKD, we have been asked to evaluate for permanent access.   The patient is right handed. He has never had access procedures before. He has a left sided pacemaker. He is on coumadin for atrial fibrillation. He underwent cardioversion on 01/03/16 and is currently on amiodarone. He has hyperlipidemia and type 2 diabetes. He is sedentary at home.   He denies any chest pain or shortness of breath today.   Past Medical History:  Diagnosis Date  . Cardiac pacemaker in situ 08/03/2012   Indications transient complete heart block Insertion 08/03/12 Dr. Graciela HusbandsKlein  LV lead Medtronic 5076 serial number [id number]  LA lead Medtronic 5076 serial number [id number] Medtronic MRI compatible pulse generator serial number [id number].       . Chronic diastolic heart failure (HCC)   . Chronic kidney disease (CKD), stage III (moderate) 08/02/2012  . 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 Hyperlipidemia   . Hypertensive heart disease   . Hypothyroidism   . 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 LBBB (left bundle branch block) 08/02/2012  . Obesity (BMI 30-39.9)     No family history on file.  SOCIAL HISTORY: Social History   Social History  . Marital status: Married    Spouse name: N/A  . Number of children: N/A  . Years of  education: N/A   Occupational History  . Not on file.   Social History Main Topics  . Smoking status: Former Games developermoker  . Smokeless tobacco: Never Used  . Alcohol use No  . Drug use: No  . Sexual activity: Not on file   Other Topics Concern  . Not on file   Social History Narrative  . No narrative on file    No Known Allergies  Current Facility-Administered Medications  Medication Dose Route Frequency Provider Last Rate Last Dose  . 0.9 %  sodium chloride infusion  250 mL Intravenous PRN Lora PaulaJennifer T Krall, MD   Stopped at 01/19/16 0530  . acetaminophen (TYLENOL) tablet 650 mg  650 mg Oral Q4H PRN Lora PaulaJennifer T Krall, MD   650 mg at 01/16/16 0636  . amiodarone (NEXTERONE PREMIX) 360-4.14 MG/200ML-% (1.8 mg/mL) IV infusion  30 mg/hr Intravenous Continuous Peter M SwazilandJordan, MD 16.7 mL/hr at 01/21/16 1128 30 mg/hr at 01/21/16 1128  . atorvastatin (LIPITOR) tablet 40 mg  40 mg Oral QHS Lora PaulaJennifer T Krall, MD   40 mg at 01/20/16 2227  . cholecalciferol (VITAMIN D) tablet 2,000 Units  2,000 Units Oral Daily Lora PaulaJennifer T Krall, MD   2,000 Units at 01/21/16 1314  . Darbepoetin Alfa (ARANESP) injection 100 mcg  100 mcg Intravenous Q Sat-HD Delano Metzobert Schertz, MD   100 mcg at 01/19/16 1523  . ferrous sulfate tablet 325 mg  325 mg Oral Q breakfast Lora PaulaJennifer T Krall, MD   325 mg at 01/20/16 0800  . folic acid (FOLVITE) tablet 1  mg  1 mg Oral Daily Lora PaulaJennifer T Krall, MD   1 mg at 01/21/16 1314  . furosemide (LASIX) 120 mg in dextrose 5 % 50 mL IVPB  120 mg Intravenous BID Terrial RhodesJoseph Coladonato, MD      . insulin aspart (novoLOG) injection 0-15 Units  0-15 Units Subcutaneous TID WC Valentino NoseNathan Boswell, MD      . insulin glargine (LANTUS) injection 10 Units  10 Units Subcutaneous QHS Valentino NoseNathan Boswell, MD      . ipratropium-albuterol (DUONEB) 0.5-2.5 (3) MG/3ML nebulizer solution 3 mL  3 mL Nebulization QID Earl LagosNischal Narendra, MD   3 mL at 01/21/16 0742  . levothyroxine (SYNTHROID, LEVOTHROID) tablet 37.5 mcg  37.5 mcg Oral QAC  breakfast Althia FortsAdam Johnson, MD   37.5 mcg at 01/20/16 0800  . MEDLINE mouth rinse  15 mL Mouth Rinse BID Delano Metzobert Schertz, MD   15 mL at 01/21/16 0730  . metoprolol tartrate (LOPRESSOR) tablet 12.5 mg  12.5 mg Oral BID Lora PaulaJennifer T Krall, MD   12.5 mg at 01/21/16 1314  . ondansetron (ZOFRAN) injection 4 mg  4 mg Intravenous Q6H PRN Lora PaulaJennifer T Krall, MD      . pantoprazole (PROTONIX) EC tablet 40 mg  40 mg Oral Daily Lora PaulaJennifer T Krall, MD   40 mg at 01/21/16 1313  . senna-docusate (Senokot-S) tablet 2 tablet  2 tablet Oral QHS Lora PaulaJennifer T Krall, MD   2 tablet at 01/20/16 2228  . sertraline (ZOLOFT) tablet 100 mg  100 mg Oral Daily Lora PaulaJennifer T Krall, MD   100 mg at 01/21/16 1314  . sodium chloride flush (NS) 0.9 % injection 10-40 mL  10-40 mL Intracatheter PRN Levert FeinsteinJames M Granfortuna, MD   10 mL at 01/20/16 2340  . sodium chloride flush (NS) 0.9 % injection 3 mL  3 mL Intravenous Q12H Lora PaulaJennifer T Krall, MD   3 mL at 01/21/16 1000  . sodium chloride flush (NS) 0.9 % injection 3 mL  3 mL Intravenous PRN Lora PaulaJennifer T Krall, MD      . thiamine (VITAMIN B-1) tablet 100 mg  100 mg Oral Daily Lora PaulaJennifer T Krall, MD   100 mg at 01/21/16 1313  . THROMBI-PAD (THROMBI-PAD) 3"X3" pad 1 each  1 each Topical Once PRN Bethany Molt, DO      . warfarin (COUMADIN) tablet 1 mg  1 mg Oral ONCE-1800 Armandina StammerNathan J Batchelder, RPH      . Warfarin - Pharmacist Dosing Inpatient   Does not apply q1800 Faye Ramsayachel L Rumbarger, RPH        REVIEW OF SYSTEMS:  [X]  denotes positive finding, [ ]  denotes negative finding Cardiac  Comments:  Chest pain or chest pressure:    Shortness of breath upon exertion: x   Short of breath when lying flat: x   Irregular heart rhythm: x       Vascular    Pain in calf, thigh, or hip brought on by ambulation:    Pain in feet at night that wakes you up from your sleep:     Blood clot in your veins:    Leg swelling:         Pulmonary    Oxygen at home:    Productive cough:     Wheezing:         Neurologic      Sudden weakness in arms or legs:     Sudden numbness in arms or legs:     Sudden onset of difficulty speaking or slurred speech:  Temporary loss of vision in one eye:     Problems with dizziness:         Gastrointestinal    Blood in stool:     Vomited blood:         Genitourinary    Burning when urinating:     Blood in urine:        Psychiatric    Major depression:         Hematologic    Bleeding problems:    Problems with blood clotting too easily:        Skin    Rashes or ulcers:        Constitutional    Fever or chills:      PHYSICAL EXAM: Vitals:   01/21/16 1100 01/21/16 1130 01/21/16 1200 01/21/16 1220  BP: (!) 117/51 (!) 134/48 136/60 (!) (P) 140/50  Pulse: 68 63 65 (P) 67  Resp: 16 (!) 23 19 (P) 19  Temp:    (P) 98 F (36.7 C)  TempSrc:    (P) Oral  SpO2: 96% 100% 100% (P) 100%  Weight:    (P) 248 lb 14.4 oz (112.9 kg)  Height:        GENERAL: The patient is a well-nourished male, in no acute distress. The vital signs are documented above. CARDIAC: There is a regular rate and rhythm.  VASCULAR: 2+ brachial and radial pulses bilaterally. Feet warm and pink bilaterally.  PULMONARY: Non labored respiratory effort.  ABDOMEN: Soft and non-tender with normal pitched bowel sounds.  MUSCULOSKELETAL: There are no major deformities or cyanosis. NEUROLOGIC: No focal weakness or paresthesias are detected. SKIN: Sporadic ecchymosis and scratches of arms and legs.  PSYCHIATRIC: The patient has a normal affect.   MEDICAL ISSUES: CKD stage IV  The patient is current dialyzing via a right IJ temporary catheter. He has not been declared ESRD yet and is undergoing diuresis per primary team. Vein mapping ordered. He is right handed. He has a left sided pacemaker.  He is therapeutic on coumadin for atrial fibrillation. His INR will need to be corrected if he were to undergo surgery while inpatient. Otherwise, we can schedule as an outpatient. Await vein mapping results.     Maris Berger, PA-C [facility] [place] [phone]   I have examined the patient, reviewed and agree with above.Had long discussion with the patient, his wife and daughter present. Explained options for tunneled catheter, AV fistula and AV graft. Await vein map for recommendation. It with his left sided pacemaker would plan right arm access. Will restrict right arm from IV and blood draws. We'll make a formal recommendation following vein map  Gretta Began, MD 01/21/2016 5:24 PM

## 2016-01-21 NOTE — Progress Notes (Signed)
Internal Medicine Attending:   I saw and examined the patient. I reviewed the resident's note and I agree with the resident's findings and plan as documented in the resident's note.  Patient was seen at hemodialysis today. He has no new complaints and states that his breathing continues to improve. He currently remains on 2 L nasal cannula but is maintaining good oxygen saturation. Patient was admitted with acute shortness of breath secondary to acute on chronic diastolic heart failure exacerbation. He was unable to diurese on his own and required hemodialysis to remove excess fluid. It remains unclear if patient will continue to need hemodialysis or if this is just a temporary measure. Case discussed with nephrology at bedside. We will attempt another trial of Lasix over the next 48 hours and see if patient is able to diurese. If his urine output remains poor despite diuresis he may require long-term hemodialysis. Vascular surgery consulted for possible fistula/graft placement. It is likely that even if his kidneys recover on this admission he will eventually need hemodialysis.  We will discuss with cardiology today if patient will require another cardioversion for his A. fib. Heart rate is currently in the 60s and appears regular. We will continue amiodarone and warfarin for now.

## 2016-01-21 NOTE — Clinical Social Work Note (Addendum)
Patient in dialysis. CSW met with patient's wife and provided SNF list for her to review. She stated that they will likely go home with HHPT. CSW will confirm with patient when he gets back from dialysis. RNCM notified.  Dayton Scrape, CSW [phone]  1:55 pm CSW met with patient and his wife after he returned from dialysis. Patient confirmed that he wants HHPT rather than SNF. RNCM notified.  CSW signing off. Consult again if any other social work needs arise.  Dayton Scrape, [place]

## 2016-01-21 NOTE — Progress Notes (Signed)
PT Cancellation Note  Patient Details Name: [name] MRN: [id number] DOB: 12/02/1945   Cancelled Treatment:    Reason Eval/Treat Not Completed: Patient at procedure or test/unavailable (off the floor to dialysis)   Fabio AsaDevon J Masato Pettie 01/21/2016, 8:25 AM Charlotte Crumbevon Tykel Badie, PT DPT  [phone]

## 2016-01-21 NOTE — Care Management Note (Signed)
Case Management Note  Patient Details  Name: Zachary CleverlyMichael D Burda MRN: [id number] Date of Birth: 11-15-1945  Admitted with CHF:                    Action/Plan: Patient lives at home with spouse; [facility] [place] [facility]; Also has [employer] Medicare insurance; Patient does not want SNF placement at this time but [facility] - [facility]; [facility] called for [facility]. CM will continue to follow for DCP;   Expected Discharge Date:  01/19/16               Expected Discharge Plan:  Home w Home Health Services  In-House Referral:  NA  Discharge planning Services  CM Consult  Choice offered to:  Spouse, Patient  HH Arranged:  RN, Disease Management, PT HH [facility]  Status of Service:  In process, will continue to follow  Reola MosherChandler, Lovey Crupi L, RN,MHA,BSN [phone] 01/21/2016, 3:06 PM

## 2016-01-21 NOTE — Progress Notes (Signed)
ANTICOAGULATION CONSULT NOTE   Pharmacy Consult for warfarin Indication: atrial fibrillation No Known Allergies  Patient Measurements: Height: 5\' 11"  (180.3 cm) Weight: 258 lb (117 kg) IBW/kg (Calculated) : 75.3 Heparin Dosing Weight: 103.3 kg   Vital Signs: Temp: 99 F (37.2 C) (12/04 0408) Temp Source: Oral (12/04 0408) BP: 144/50 (12/04 0408) Pulse Rate: 60 (12/04 0424)  Labs:  Recent Labs  01/19/16 0423 01/20/16 0450 01/21/16 0416 01/21/16 0711  HGB 7.5* 7.6*  --  8.1*  HCT 24.7* 25.4*  --  26.4*  PLT 196 203  --  193  LABPROT 20.7* 24.6* 25.6*  --   INR 1.75 2.18 2.29  --   CREATININE 3.42* 3.64*  --  5.34*    Assessment: [name] is a 70 yo male who was on Coumadin 2.5mg  on Tues/Wed/Thurs/Sat/Sun and no dose on Mon/Fri. INR elevated on admit at 4.68. Patient did have some bleeding from IV site on 11/29. Vit K was given. Then tried to bridge with heparin but still had significant bleeding from IV site so heparin stopped. Now INR is therapeutic at 2.29. Hgb up to 8.1, plts wnl. No bleeding noted since 12/1. Also on amio gtt.  Goal of Therapy:  INR 2-3  Monitor platelets by anticoagulation protocol: Yes   Plan:  Give Coumadin 1mg  PO x 1 tonight Monitor daily INR, CBC, s/s of bleed  [name], PharmD, [facility] Clinical Pharmacist Pager [phone] 01/21/2016 8:18 AM

## 2016-01-21 NOTE — Progress Notes (Signed)
Patient ID: Zadie CleverlyMichael D Peavler, male   DOB: 1945-05-31, 70 y.o.   MRN: [id number] S:  Feels well today O:BP (!) 120/56 (BP Location: Right Arm)   Pulse 67   Temp 97.6 F (36.4 C) (Oral)   Resp 20   Ht 5\' 11"  (1.803 m)   Wt 115.7 kg (255 lb 1.2 oz)   SpO2 97%   BMI 35.58 kg/m   Intake/Output Summary (Last 24 hours) at 01/21/16 0919 Last data filed at 01/21/16 0300  Gross per 24 hour  Intake            596.9 ml  Output                0 ml  Net            596.9 ml   Intake/Output: I/O last 3 completed shifts: In: 1104.1 [P.O.:720; I.V.:384.1] Out: 200 [Urine:200]  Intake/Output this shift:  No intake/output data recorded. Weight change: -3.9 kg (-8 lb 9.6 oz) Gen:WD obese WM in NAD CVS:no rub Resp:cta WUJ:WJXBJYAbd:benign NWG:NFAOZHYQExt:multiple excoriations on arms and legs, minimal edema   Recent Labs Lab 01/15/16 0849 01/16/16 0511 01/17/16 0536 01/18/16 0300 01/18/16 1501 01/19/16 0423 01/20/16 0450 01/21/16 0711  NA 140 141 139 136  --  135 133* 131*  K 3.9 4.0 4.1 3.9  --  4.1 4.2 4.4  CL 107 107 104 100*  --  99* 97* 96*  CO2 22 23 23 22   --  24 24 19*  GLUCOSE 47* 43* 116* 178*  --  200* 265* 312*  BUN 64* 69* 71* 72*  --  49* 36* 51*  CREATININE 3.58* 3.73* 3.82* 3.95*  --  3.42* 3.64* 5.34*  ALBUMIN 2.9*  --   --   --   --   --   --   --   CALCIUM 9.3 9.1 9.0 9.0  --  8.8* 8.8* 9.0  AST 17  --   --   --   --   --   --   --   ALT 13*  --   --   --  19  --   --   --    Liver Function Tests:  Recent Labs Lab 01/15/16 0849 01/18/16 1501  AST 17  --   ALT 13* 19  ALKPHOS 82  --   BILITOT 0.6  --   PROT 7.6  --   ALBUMIN 2.9*  --    No results for input(s): LIPASE, AMYLASE in the last 168 hours. No results for input(s): AMMONIA in the last 168 hours. CBC:  Recent Labs Lab 01/15/16 0849 01/17/16 0536 01/18/16 0300 01/19/16 0423 01/20/16 0450 01/21/16 0711  WBC 8.7 7.7 9.0 8.8 9.4 9.9  NEUTROABS 6.3  --   --   --   --   --   HGB 8.5* 7.7* 7.5* 7.5* 7.6* 8.1*   HCT 28.0* 25.7* 24.6* 24.7* 25.4* 26.4*  MCV 90.9 92.4 92.1 91.8 92.0 91.3  PLT 238 206 203 196 203 193   Cardiac Enzymes:  Recent Labs Lab 01/15/16 1528 01/15/16 2040  TROPONINI <0.03 <0.03   CBG:  Recent Labs Lab 01/19/16 0804 01/19/16 1623 01/19/16 2116 01/20/16 2205 01/21/16 0709  GLUCAP 225* 163* 208* 253* 301*    Iron Studies:  Recent Labs  01/19/16 1720  IRON 35*  TIBC 244*   Studies/Results: No results found. Marland Kitchen. atorvastatin  40 mg Oral QHS  . cholecalciferol  2,000 Units Oral Daily  .  darbepoetin (ARANESP) injection - DIALYSIS  100 mcg Intravenous Q Sat-HD  . ferrous sulfate  325 mg Oral Q breakfast  . folic acid  1 mg Oral Daily  . insulin aspart  0-9 Units Subcutaneous TID WC  . ipratropium-albuterol  3 mL Nebulization QID  . levothyroxine  37.5 mcg Oral QAC breakfast  . mouth rinse  15 mL Mouth Rinse BID  . metoprolol  12.5 mg Oral BID  . pantoprazole  40 mg Oral Daily  . senna-docusate  2 tablet Oral QHS  . sertraline  100 mg Oral Daily  . sodium chloride flush  3 mL Intravenous Q12H  . thiamine  100 mg Oral Daily  . warfarin  1 mg Oral ONCE-1800  . Warfarin - Pharmacist Dosing Inpatient   Does not apply q1800    BMET    Component Value Date/Time   NA 131 (L) 01/21/2016 0711   K 4.4 01/21/2016 0711   CL 96 (L) 01/21/2016 0711   CO2 19 (L) 01/21/2016 0711   GLUCOSE 312 (H) 01/21/2016 0711   BUN 51 (H) 01/21/2016 0711   CREATININE 5.34 (H) 01/21/2016 0711   CALCIUM 9.0 01/21/2016 0711   GFRNONAA 10 (L) 01/21/2016 0711   GFRAA 11 (L) 01/21/2016 0711   CBC    Component Value Date/Time   WBC 9.9 01/21/2016 0711   RBC 2.89 (L) 01/21/2016 0711   HGB 8.1 (L) 01/21/2016 0711   HCT 26.4 (L) 01/21/2016 0711   PLT 193 01/21/2016 0711   MCV 91.3 01/21/2016 0711   MCH 28.0 01/21/2016 0711   MCHC 30.7 01/21/2016 0711   RDW 19.9 (H) 01/21/2016 0711   LYMPHSABS 1.5 01/15/2016 0849   MONOABS 0.6 01/15/2016 0849   EOSABS 0.3 01/15/2016 0849    BASOSABS 0.0 01/15/2016 0849     Assessment/Plan:  1. 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  Likely due to underlying DM/HTN kidney disease with AKI related to decompensated diastolic CHF/cardiorenal syndrome.  S/p HD x 2 with improved volume.  Unclear if he will regain renal function, will continue to follow.  Baseline Scr appears to be in the 3's.  2. Diastolic CHF- with anasarca.  Unresponsive to IV diuresis.  Improved with HD and UF.  S/p HD x 3 will attempt IV lasix and follow UOP over the next 48 hours before another session of HD.  3. A fib on amiodarone s/p DC cardioversion on 01/03/16 by Dr. Shirlee LatchMcLean. 4. Complete heart block- S/p Permanent pacemaker 5. Pyuria-  6. HTN- stable 7. DM per primary 8. Anemia of CKD- started on ESA 9. SHPTH- will follow Ca/Phos and check iPTH.  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 10. Disposition- unclear if he will require longterm HD.  If so he will need outpatient HD arrangements and will need an AVF/AVG even if he recovers renal function given his advanced CKD at baseline.   Julien NordmannJoseph A Nivin Braniff

## 2016-01-21 NOTE — Progress Notes (Deleted)
[facility]  Patient name: Zachary CleverlyMichael D Spalla MRN: [id number] DOB: [birth year] Sex: male  REASON FOR CONSULT: permanent dialysis access, consult is from internal medicine teaching service.   HPI: [name] is a 70 y.o. male, who presents for evaluation for permanent dialysis. He is currently dialyzing via a right IJ temporary catheter. [facility] of breath secondary to CHF exacerbation. He was unable to diurese adequately and dialysis was started to remove excess fluid. The patient has CKD stage IV. It is uncertain whether his kidneys will recover but given advanced CKD, we have been asked to evaluate for permanent access.   The patient is right handed. He has never had access procedures before. He has a left sided pacemaker. He is on coumadin for atrial fibrillation. He underwent cardioversion on 01/03/16 and is currently on amiodarone. He has hyperlipidemia and type 2 diabetes. He is sedentary at home.   He denies any chest pain or shortness of breath today.   Past Medical History:  Diagnosis Date  . Cardiac pacemaker in situ 08/03/2012   Indications transient complete heart block Insertion 08/03/12 Dr. Graciela HusbandsKlein  LV lead Medtronic 5076 serial number [id number]  LA lead Medtronic 5076 serial number [id number] Medtronic MRI compatible pulse generator serial number [id number].       . Chronic diastolic heart failure (HCC)   . Chronic kidney disease (CKD), stage III (moderate) 08/02/2012  . 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 Hyperlipidemia   . Hypertensive heart disease   . Hypothyroidism   . 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 LBBB (left bundle branch block) 08/02/2012  . Obesity (BMI 30-39.9)     No family history on file.  SOCIAL HISTORY: Social History   Social History  . Marital status: Married    Spouse name: N/A  . Number of children: N/A  . Years of  education: N/A   Occupational History  . Not on file.   Social History Main Topics  . Smoking status: Former Games developermoker  . Smokeless tobacco: Never Used  . Alcohol use No  . Drug use: No  . Sexual activity: Not on file   Other Topics Concern  . Not on file   Social History Narrative  . No narrative on file    No Known Allergies  Current Facility-Administered Medications  Medication Dose Route Frequency Provider Last Rate Last Dose  . 0.9 %  sodium chloride infusion  250 mL Intravenous PRN Lora PaulaJennifer T Krall, MD   Stopped at 01/19/16 0530  . acetaminophen (TYLENOL) tablet 650 mg  650 mg Oral Q4H PRN Lora PaulaJennifer T Krall, MD   650 mg at 01/16/16 0636  . amiodarone (NEXTERONE PREMIX) 360-4.14 MG/200ML-% (1.8 mg/mL) IV infusion  30 mg/hr Intravenous Continuous Peter M SwazilandJordan, MD 16.7 mL/hr at 01/21/16 1128 30 mg/hr at 01/21/16 1128  . atorvastatin (LIPITOR) tablet 40 mg  40 mg Oral QHS Lora PaulaJennifer T Krall, MD   40 mg at 01/20/16 2227  . cholecalciferol (VITAMIN D) tablet 2,000 Units  2,000 Units Oral Daily Lora PaulaJennifer T Krall, MD   2,000 Units at 01/21/16 1314  . Darbepoetin Alfa (ARANESP) injection 100 mcg  100 mcg Intravenous Q Sat-HD Delano Metzobert Schertz, MD   100 mcg at 01/19/16 1523  . ferrous sulfate tablet 325 mg  325 mg Oral Q breakfast Lora PaulaJennifer T Krall, MD   325 mg at 01/20/16 0800  . folic acid (FOLVITE) tablet 1  mg  1 mg Oral Daily Lora PaulaJennifer T Krall, MD   1 mg at 01/21/16 1314  . furosemide (LASIX) 120 mg in dextrose 5 % 50 mL IVPB  120 mg Intravenous BID Terrial RhodesJoseph Coladonato, MD      . insulin aspart (novoLOG) injection 0-15 Units  0-15 Units Subcutaneous TID WC Valentino NoseNathan Boswell, MD      . insulin glargine (LANTUS) injection 10 Units  10 Units Subcutaneous QHS Valentino NoseNathan Boswell, MD      . ipratropium-albuterol (DUONEB) 0.5-2.5 (3) MG/3ML nebulizer solution 3 mL  3 mL Nebulization QID Earl LagosNischal Narendra, MD   3 mL at 01/21/16 0742  . levothyroxine (SYNTHROID, LEVOTHROID) tablet 37.5 mcg  37.5 mcg Oral QAC  breakfast Althia FortsAdam Johnson, MD   37.5 mcg at 01/20/16 0800  . MEDLINE mouth rinse  15 mL Mouth Rinse BID Delano Metzobert Schertz, MD   15 mL at 01/21/16 0730  . metoprolol tartrate (LOPRESSOR) tablet 12.5 mg  12.5 mg Oral BID Lora PaulaJennifer T Krall, MD   12.5 mg at 01/21/16 1314  . ondansetron (ZOFRAN) injection 4 mg  4 mg Intravenous Q6H PRN Lora PaulaJennifer T Krall, MD      . pantoprazole (PROTONIX) EC tablet 40 mg  40 mg Oral Daily Lora PaulaJennifer T Krall, MD   40 mg at 01/21/16 1313  . senna-docusate (Senokot-S) tablet 2 tablet  2 tablet Oral QHS Lora PaulaJennifer T Krall, MD   2 tablet at 01/20/16 2228  . sertraline (ZOLOFT) tablet 100 mg  100 mg Oral Daily Lora PaulaJennifer T Krall, MD   100 mg at 01/21/16 1314  . sodium chloride flush (NS) 0.9 % injection 10-40 mL  10-40 mL Intracatheter PRN Levert FeinsteinJames M Granfortuna, MD   10 mL at 01/20/16 2340  . sodium chloride flush (NS) 0.9 % injection 3 mL  3 mL Intravenous Q12H Lora PaulaJennifer T Krall, MD   3 mL at 01/21/16 1000  . sodium chloride flush (NS) 0.9 % injection 3 mL  3 mL Intravenous PRN Lora PaulaJennifer T Krall, MD      . thiamine (VITAMIN B-1) tablet 100 mg  100 mg Oral Daily Lora PaulaJennifer T Krall, MD   100 mg at 01/21/16 1313  . THROMBI-PAD (THROMBI-PAD) 3"X3" pad 1 each  1 each Topical Once PRN Bethany Molt, DO      . warfarin (COUMADIN) tablet 1 mg  1 mg Oral ONCE-1800 Armandina StammerNathan J Batchelder, RPH      . Warfarin - Pharmacist Dosing Inpatient   Does not apply q1800 Faye Ramsayachel L Rumbarger, RPH        REVIEW OF SYSTEMS:  [X]  denotes positive finding, [ ]  denotes negative finding Cardiac  Comments:  Chest pain or chest pressure:    Shortness of breath upon exertion: x   Short of breath when lying flat: x   Irregular heart rhythm: x       Vascular    Pain in calf, thigh, or hip brought on by ambulation:    Pain in feet at night that wakes you up from your sleep:     Blood clot in your veins:    Leg swelling:         Pulmonary    Oxygen at home:    Productive cough:     Wheezing:         Neurologic      Sudden weakness in arms or legs:     Sudden numbness in arms or legs:     Sudden onset of difficulty speaking or slurred speech:  Temporary loss of vision in one eye:     Problems with dizziness:         Gastrointestinal    Blood in stool:     Vomited blood:         Genitourinary    Burning when urinating:     Blood in urine:        Psychiatric    Major depression:         Hematologic    Bleeding problems:    Problems with blood clotting too easily:        Skin    Rashes or ulcers:        Constitutional    Fever or chills:      PHYSICAL EXAM: Vitals:   01/21/16 1100 01/21/16 1130 01/21/16 1200 01/21/16 1220  BP: (!) 117/51 (!) 134/48 136/60 (!) (P) 140/50  Pulse: 68 63 65 (P) 67  Resp: 16 (!) 23 19 (P) 19  Temp:    (P) 98 F (36.7 C)  TempSrc:    (P) Oral  SpO2: 96% 100% 100% (P) 100%  Weight:    (P) 248 lb 14.4 oz (112.9 kg)  Height:        GENERAL: The patient is a well-nourished male, in no acute distress. The vital signs are documented above. CARDIAC: There is a regular rate and rhythm.  VASCULAR: 2+ brachial and radial pulses bilaterally. Feet warm and pink bilaterally.  PULMONARY: Non labored respiratory effort.  ABDOMEN: Soft and non-tender with normal pitched bowel sounds.  MUSCULOSKELETAL: There are no major deformities or cyanosis. NEUROLOGIC: No focal weakness or paresthesias are detected. SKIN: Sporadic ecchymosis and scratches of arms and legs.  PSYCHIATRIC: The patient has a normal affect.   MEDICAL ISSUES: CKD stage IV  The patient is current dialyzing via a right IJ temporary catheter. He has not been declared ESRD yet and is undergoing diuresis per primary team. Vein mapping ordered. He is right handed. He has a left sided pacemaker.  He is therapeutic on coumadin for atrial fibrillation. His INR will need to be corrected if he were to undergo surgery while inpatient. Otherwise, we can schedule as an outpatient. Await vein mapping results.     Maris Berger, PA-C [facility] Specialists of [place] [phone]

## 2016-01-21 NOTE — Progress Notes (Signed)
Subjective: Patient was evaluated this morning on rounds while on hemodialysis.  He was resting comfortably in bed on 2 L nasal cannula. He states that his breathing continues to improve. And that he required CPAP at night for 2 hours for his OSA.   Objective:  Vital signs in last 24 hours: Vitals:   01/21/16 0930 01/21/16 1000 01/21/16 1030 01/21/16 1045  BP: (!) 110/42 (!) 102/38 (!) 122/47 (!) 94/39  Pulse: 63 64 61 61  Resp: (!) 26 (!) 22 20 (!) 23  Temp:      TempSrc:      SpO2: 99% 98% 98% 94%  Weight:      Height:       Physical Exam  Eyes: Right eye exhibits no discharge. Left eye exhibits no discharge.  Right eyelid droop  Cardiovascular: Normal rate, regular rhythm and normal heart sounds.  Exam reveals no gallop and no friction rub.   No murmur heard. Pulmonary/Chest: Effort normal. No respiratory distress. He has no wheezes.  Musculoskeletal:  Swelling improving in his upper extremities No lower extremity edema noted  Skin: Skin is warm and dry.    Assessment/Plan:  Principal Problem:   Acute on chronic diastolic CHF (congestive heart failure) (HCC) Active Problems:   Chronic kidney disease (CKD), stage III (moderate)   Cardiac pacemaker in situ   Hypothyroidism   CHF (congestive heart failure) (HCC)   Pneumonia of right upper lobe due to infectious organism [facility](HCC)   History of complete heart block   Chronic anticoagulation   OSA (obstructive sleep apnea)   Pressure injury of skin   AKI (acute kidney injury) (HCC)   Community acquired pneumonia of right upper lobe of lung (HCC)   COPD (chronic obstructive pulmonary disease) (HCC)   Acute on chronic renal failure (HCC)   Acute renal failure superimposed on chronic kidney disease (HCC)  Acute on chronic diastolic CHF Patient states that his breathing continues to improve.   He is in dialysis today and has had 9L removed since starting hemodialysis. Weight is down 10lb.  He is stating  94% on 2L nasal  cannula.  CPAP at night for OSA. - Hemodialysis  - Monitor I/Os - daily weights - CPAP QHS  Atrial fibrillation  S/P DCCV on 11/16. Appreciate cardiology's recommendations. Patient has a regular rate and rhythm. Heart rates are in the  60s.  Warfarin is on board and INR is 2.29.  Patient denies any bleeding episodes over night. - continue amiodarone  - continue warfarin  Chronic kidney disease stage IV Appreciate nephrology's recommendations. Patient is in dialysis today. Patient's breathing is improving.  Per nephrology IV Lasix will be given in hopes that patient will start making urine. If not he will require a tunnel catheter. Vascular has been consulted for future fistula/graft placement.     -Temporary hemodialysis - Vein mapping for fistula/graft  Pyuria Foley catheter removed. Condom catheter is in place. Urine culture showed no growth.  Patient has no urinary complaints - no antibiotics at this time  Type 2 diabetes  Glucose 312 -SSI with CBGs QID - restart Lantus at 10units   Anemia of chronic disease Hb 8.1 and stable. 8.5 on admit,normal MCV. Baseline is around 9. - Continue home ferrous sulfate - Trend CBC, transfusion threshold 7  HTN 138/48 and stable - continue reducedhome metoprolol to 12.5mg  BID - continue to hold home amlodipine  HLD -continue home atorvastatin  Hypothyroidism: Last TSH 8.141 on 11/9 -Continue home Synthroid 25mcg  Dispo: Anticipated discharge in approximately 2 day(s). SNF consult  Camelia PhenesJessica Ratliff Kaleeyah Cuffie, DO 01/21/2016, 11:01 AM Pager: [phone]

## 2016-01-21 NOTE — Procedures (Signed)
Patient was seen on dialysis and the procedure was supervised. BFR 300 Via RIJ temporary HD catheter BP is 124/59.  Patient appears to be tolerating treatment well

## 2016-01-21 NOTE — Progress Notes (Signed)
Speech Language Pathology Treatment: Dysphagia  Patient Details Name: ODA LANSDOWNE MRN: [id number] DOB: 12-01-1945 Today's Date: 01/21/2016 Time: 9584-4171 SLP Time Calculation (min) (ACUTE ONLY): 10 min  Assessment / Plan / Recommendation Clinical Impression  F/u after 12/02 swallow evaluation.  Pt just returned to room from HD; wife reports good intake at lunch with no notable difficulty.  She states that consuming pills whole in puree has decreased coughing.  Pt provided with dys 3, thin liquids during session and demonstrated adequate coordination of swallow/respiration.  Tolerating nasal cannula. No s/s of aspiration.  No further SLP needs identified - our services will sign off.    HPI HPI: The patient is a 70 y.o.year-old with history of DM, HTN, afib , diast CHF, HL and obesity, has CKD f/b kidney specialist at [facility] every 6 mos, admitted with SOB and decomp CHF on 01/15/16. Getting high dose diuretics and still SOB. Has transitioned from BIPAP to [place].       SLP Plan  All goals met     Recommendations  Diet recommendations: Dysphagia 3 (mechanical soft);Thin liquid Liquids provided via: Cup Medication Administration: Whole meds with puree Supervision: Patient able to self feed Postural Changes and/or Swallow Maneuvers: Seated upright 90 degrees                Oral Care Recommendations: Oral care BID Plan: All goals met       GO               Jauan Wohl L. Tivis Ringer, [place] CCC/SLP Pager [phone]  Juan Quam Laurice 01/21/2016, 3:30 PM

## 2016-01-22 ENCOUNTER — Inpatient Hospital Stay (HOSPITAL_COMMUNITY): Payer: Medicare HMO

## 2016-01-22 DIAGNOSIS — N189 Chronic kidney disease, unspecified: Secondary | ICD-10-CM

## 2016-01-22 LAB — CBC
HEMATOCRIT: 25.4 % — AB (ref 39.0–52.0)
Hemoglobin: 7.7 g/dL — ABNORMAL LOW (ref 13.0–17.0)
MCH: 28 pg (ref 26.0–34.0)
MCHC: 30.3 g/dL (ref 30.0–36.0)
MCV: 92.4 fL (ref 78.0–100.0)
PLATELETS: 205 10*3/uL (ref 150–400)
RBC: 2.75 MIL/uL — ABNORMAL LOW (ref 4.22–5.81)
RDW: 20.2 % — AB (ref 11.5–15.5)
WBC: 9.2 10*3/uL (ref 4.0–10.5)

## 2016-01-22 LAB — PARATHYROID HORMONE, INTACT (NO CA): PTH: 285 pg/mL — AB (ref 15–65)

## 2016-01-22 LAB — RENAL FUNCTION PANEL
ALBUMIN: 2.3 g/dL — AB (ref 3.5–5.0)
Anion gap: 11 (ref 5–15)
BUN: 30 mg/dL — AB (ref 6–20)
CO2: 28 mmol/L (ref 22–32)
CREATININE: 4.41 mg/dL — AB (ref 0.61–1.24)
Calcium: 8.9 mg/dL (ref 8.9–10.3)
Chloride: 92 mmol/L — ABNORMAL LOW (ref 101–111)
GFR calc Af Amer: 14 mL/min — ABNORMAL LOW (ref >60)
GFR, EST NON AFRICAN AMERICAN: 12 mL/min — AB (ref >60)
Glucose, Bld: 284 mg/dL — ABNORMAL HIGH (ref 65–99)
PHOSPHORUS: 4.8 mg/dL — AB (ref 2.5–4.6)
Potassium: 3.6 mmol/L (ref 3.5–5.1)
Sodium: 131 mmol/L — ABNORMAL LOW (ref 135–145)

## 2016-01-22 LAB — PROTIME-INR
INR: 2.68
Prothrombin Time: 29.1 seconds — ABNORMAL HIGH (ref 11.4–15.2)

## 2016-01-22 LAB — GLUCOSE, CAPILLARY
Glucose-Capillary: 262 mg/dL — ABNORMAL HIGH (ref 65–99)
Glucose-Capillary: 260 mg/dL — ABNORMAL HIGH (ref 65–99)
Glucose-Capillary: 252 mg/dL — ABNORMAL HIGH (ref 65–99)
Glucose-Capillary: 228 mg/dL — ABNORMAL HIGH (ref 65–99)

## 2016-01-22 MED ORDER — FUROSEMIDE 10 MG/ML IJ SOLN
160.0000 mg | Freq: Three times a day (TID) | INTRAVENOUS | Status: DC
  Administered 2016-01-22 – 2016-01-29 (×19): 160 mg via INTRAVENOUS
  Filled 2016-01-22 (×28): qty 16

## 2016-01-22 MED ORDER — CHLORHEXIDINE GLUCONATE CLOTH 2 % EX PADS: 6.0000 | MEDICATED_PAD | Freq: Every day | CUTANEOUS | Status: DC

## 2016-01-22 MED ORDER — AMIODARONE HCL 200 MG PO TABS
200.0000 mg | ORAL_TABLET | Freq: Every day | ORAL | Status: DC
  Administered 2016-01-22 – 2016-01-24 (×3): 200 mg via ORAL
  Filled 2016-01-22 (×3): qty 1

## 2016-01-22 MED ORDER — INSULIN GLARGINE 100 UNIT/ML ~~LOC~~ SOLN
20.0000 [IU] | Freq: Every day | SUBCUTANEOUS | Status: DC
  Administered 2016-01-22: 20 [IU] via SUBCUTANEOUS
  Filled 2016-01-22: qty 0.2

## 2016-01-22 MED ORDER — MUPIROCIN 2 % EX OINT
1.0000 "application " | TOPICAL_OINTMENT | Freq: Two times a day (BID) | CUTANEOUS | Status: AC
  Administered 2016-01-22 – 2016-01-26 (×10): 1 via NASAL
  Filled 2016-01-22 (×3): qty 22

## 2016-01-22 MED ORDER — WARFARIN SODIUM 2 MG PO TABS
1.0000 mg | ORAL_TABLET | Freq: Once | ORAL | Status: AC
  Administered 2016-01-22: 1 mg via ORAL
  Filled 2016-01-22: qty 0.5

## 2016-01-22 MED ORDER — CHLORHEXIDINE GLUCONATE CLOTH 2 % EX PADS
6.0000 | MEDICATED_PAD | Freq: Every day | CUTANEOUS | Status: DC
  Administered 2016-01-23 – 2016-01-25 (×3): 6 via TOPICAL

## 2016-01-22 MED ORDER — IPRATROPIUM-ALBUTEROL 0.5-2.5 (3) MG/3ML IN SOLN
3.0000 mL | Freq: Three times a day (TID) | RESPIRATORY_TRACT | Status: DC
  Administered 2016-01-22 – 2016-01-28 (×16): 3 mL via RESPIRATORY_TRACT
  Filled 2016-01-22 (×17): qty 3

## 2016-01-22 MED ORDER — FUROSEMIDE 10 MG/ML IJ SOLN
160.0000 mg | Freq: Three times a day (TID) | INTRAVENOUS | Status: DC
  Filled 2016-01-22 (×3): qty 16

## 2016-01-22 NOTE — Progress Notes (Signed)
ANTICOAGULATION CONSULT NOTE   Pharmacy Consult for warfarin Indication: atrial fibrillation No Known Allergies  Patient Measurements: Height: 5\' 11"  (180.3 cm) Weight: 254 lb 14.4 oz (115.6 kg) IBW/kg (Calculated) : 75.3 Heparin Dosing Weight: 103.3 kg   Vital Signs: Temp: 98.4 F (36.9 C) (12/05 0510) Temp Source: Oral (12/05 0510) BP: 125/55 (12/05 0510) Pulse Rate: 61 (12/05 0510)  Labs:  Recent Labs  01/20/16 0450 01/21/16 0416 01/21/16 0711 01/21/16 1521 01/22/16 0446 01/22/16 0453  HGB 7.6*  --  8.1*  --  7.7*  --   HCT 25.4*  --  26.4*  --  25.4*  --   PLT 203  --  193  --  205  --   LABPROT 24.6* 25.6*  --   --  29.1*  --   INR 2.18 2.29  --   --  2.68  --   CREATININE 3.64*  --  5.34* 3.38*  --  4.41*    Assessment: MB is a 70 yo male who was on Coumadin 2.5mg  on Tues/Wed/Thurs/Sat/Sun and no dose on Mon/Fri. INR elevated on admit at 4.68. Patient did have some bleeding from IV site on 11/29. Vit K was given. Then tried to bridge with heparin but still had significant bleeding from IV site so heparin stopped. Now INR is trending up to 2.68. Hgb 7.7, plts wnl. No bleeding noted since 12/1. Amio gtt stopped this am.  Goal of Therapy:  INR 2-3  Monitor platelets by anticoagulation protocol: Yes   Plan:  Give Coumadin 1mg  PO x 1 tonight Monitor daily INR, CBC, s/s of bleed  Consider reduced weekly dose on discharge as patient has been receiving lower doses while inpatient  Enzo BiNathan Marg Macmaster, PharmD, BCPS Clinical Pharmacist Pager [phone] 01/22/2016 8:30 AM

## 2016-01-22 NOTE — Progress Notes (Signed)
Inpatient Diabetes Program Recommendations  AACE/ADA: New Consensus Statement on Inpatient Glycemic Control (2015)  Target Ranges:  Prepandial:   less than 140 mg/dL      Peak postprandial:   less than 180 mg/dL (1-2 hours)      Critically ill patients:  140 - 180 mg/dL   Lab Results  Component Value Date   GLUCAP 262 (H) 01/22/2016   HGBA1C 6.4 (H) 01/15/2016    Review of Glycemic Control Results for [name], Hadi D (MRN [id number]) as of 01/22/2016 09:08  Ref. Range 01/20/2016 22:05 01/21/2016 07:09 01/21/2016 15:59 01/21/2016 21:38 01/22/2016 07:22  Glucose-Capillary Latest Ref Range: 65 - 99 mg/dL 409253 (H) 811301 (H) 914208 (H) 263 (H) 262 (H)   Diabetes history: DM2 Outpatient Diabetes medications: Lantus 52 units QHS, Novolog 20 units tidwc Current orders for Inpatient glycemic control: Lantus 15 units + Novolog correction 0-15 units tid  Inpatient Diabetes Program Recommendations:    PLease consider: -Increase Lantus to 15 units qd -Add Novolog meal coverage 4-5 units tid if eats 50%  Thank you, Darel HongJudy E. Ceilidh Torregrossa, RN, MSN, CDE Inpatient Glycemic Control Team Team Pager [phone] (8am-5pm) 01/22/2016 9:11 AM

## 2016-01-22 NOTE — Progress Notes (Signed)
Right  Upper Extremity Vein Map    Cephalic  Segment Diameter Depth Comment  1. Axilla 0.4091mm 10.248mm   2. Mid upper arm 0.8091mm 7.5829mm   3. Above [facility]   Unable to visualize  4. In [facility]   Unable to visualize  5. Below AC   Unable to visualize  6. Mid forearm   Unable to visualize  7. Wrist   Unable to visualize                  Basilic  Segment Diameter Depth Comment  1. Axilla     2. Mid upper arm 3.3897mm 23.383mm   3. Above [facility] 3.9876[facility] 14mm   4. In [facility] 3.6449mm 3.287mm Multiple branches  5. Below AC 3.4801mm 6.5863mm Multiple branches  6. Mid forearm 3.469mm 4.3358mm   7. Wrist 2.548mm 5.5191mm Branch                   01/22/2016 3:40 PM Gertie FeyMichelle Brelyn Woehl, BS, RVT, RDCS, RDMS

## 2016-01-22 NOTE — Progress Notes (Signed)
Patient Name: Zachary CleverlyMichael D [name] Date of Encounter: 01/22/2016  [facility] Problem List     Principal Problem:   Acute on chronic diastolic CHF (congestive heart failure) (HCC) Active Problems:   Chronic kidney disease (CKD), stage III (moderate)   Cardiac pacemaker in situ   Hypothyroidism   CHF (congestive heart failure) (HCC)   Pneumonia of right upper lobe due to infectious organism [facility](HCC)   History of complete heart block   Chronic anticoagulation   OSA (obstructive sleep apnea)   Pressure injury of skin   AKI (acute kidney injury) (HCC)   Community acquired pneumonia of right upper lobe of lung (HCC)   COPD (chronic obstructive pulmonary disease) (HCC)   Acute on chronic renal failure (HCC)   Acute renal failure superimposed on chronic kidney disease (HCC)     Subjective   Unfortunately remains oligoanuric and will probably need permanent HD. Plans are being made for R upper extremity AF graft placement. This will involve interruption of warfarin.  Inpatient Medications    Scheduled Meds: . amiodarone  200 mg Oral Daily  . atorvastatin  40 mg Oral QHS  . Chlorhexidine Gluconate Cloth  6 each Topical Q0600  . cholecalciferol  2,000 Units Oral Daily  . darbepoetin (ARANESP) injection - DIALYSIS  100 mcg Intravenous Q Sat-HD  . ferrous sulfate  325 mg Oral Q breakfast  . folic acid  1 mg Oral Daily  . furosemide  160 mg Intravenous TID  . insulin aspart  0-15 Units Subcutaneous TID WC  . insulin glargine  20 Units Subcutaneous QHS  . ipratropium-albuterol  3 mL Nebulization TID  . levothyroxine  37.5 mcg Oral QAC breakfast  . mouth rinse  15 mL Mouth Rinse BID  . metoprolol  12.5 mg Oral BID  . mupirocin ointment  1 application Nasal BID  . pantoprazole  40 mg Oral Daily  . senna-docusate  2 tablet Oral QHS  . sertraline  100 mg Oral Daily  . sodium chloride flush  3 mL Intravenous Q12H  . thiamine  100 mg Oral Daily  .  warfarin  1 mg Oral ONCE-1800  . Warfarin - Pharmacist Dosing Inpatient   Does not apply q1800   Continuous Infusions:  PRN Meds: sodium chloride, acetaminophen, ondansetron (ZOFRAN) IV, sodium chloride flush, sodium chloride flush, THROMBI-PAD   Vital Signs    Vitals:   01/22/16 0510 01/22/16 0941 01/22/16 1153 01/22/16 1548  BP: (!) 125/55  (!) 129/45   Pulse: 61  63   Resp: 18  18   Temp: 98.4 F (36.9 C)  97.8 F (36.6 C)   TempSrc: Oral  Oral   SpO2: 99% 99% 94% 99%  Weight: 254 lb 14.4 oz (115.6 kg)     Height:        Intake/Output Summary (Last 24 hours) at 01/22/16 1732 Last data filed at 01/22/16 1500  Gross per 24 hour  Intake          1197.41 ml  Output                0 ml  Net          1197.41 ml   Filed Weights   01/21/16 0830 01/21/16 1220 01/22/16 0510  Weight: 255 lb 1.2 oz (115.7 kg) 248 lb 14.4 oz (112.9 kg) 254 lb 14.4 oz (115.6 kg)    Physical Exam   GEN: Obese, well developed, in no acute distress.  HEENT: Grossly  normal.  Neck: Supple, elevated JVD, no carotid bruits, goiter or masses. Cardiac: RRR, no murmurs, rubs, or gallops. No clubbing, cyanosis, edema.  Radials/DP/PT 2+ and equal bilaterally.  Respiratory:  Respirations regular and unlabored, clear to auscultation bilaterally. GI: Soft, nontender, nondistended, BS + x 4. MS: no deformity or atrophy. Skin: warm and dry, no rash. Neuro:  Strength and sensation are intact. Psych: AAOx3.  Normal affect.  Labs    CBC  Recent Labs  01/21/16 0711 01/22/16 0446  WBC 9.9 9.2  HGB 8.1* 7.7*  HCT 26.4* 25.4*  MCV 91.3 92.4  PLT 193 205   Basic Metabolic Panel  Recent Labs  01/21/16 1521 01/22/16 0453  NA 134* 131*  K 3.6 3.6  CL 96* 92*  CO2 25 28  GLUCOSE 206* 284*  BUN 19 30*  CREATININE 3.38* 4.41*  CALCIUM 8.6* 8.9  PHOS 3.3 4.8*   Liver Function Tests  Recent Labs  01/21/16 1521 01/22/16 0453  ALBUMIN 2.5* 2.3*     Telemetry    AFib, V paced - Personally  Reviewed  ECG    AFib, V paced - Personally Reviewed  Radiology    No results found.  Cardiac Studies   Pacemaker download reviewed. 67.6% burden of AFib. He lasted in normal rhythm for about 48 hours after DCCV 11/16 (and reported feeling better), but then had AF recurrence. Had spontaneous return to normal (A paced) rhythm for a few days, then seem to be in uninterrupted AF since 11/25. Higher doses of amiodarone caused fatigue and lethargy ("zombie").  Patient Profile     70 yo WM with history of AFib, CKD stage 4 approaching ESRD, CHB s/p PPM presents with worsening symptoms of dyspnea, edema, increased abdominal girth and has increased AFb=ib burden and oligoanuric renal failure.  Symptoms began in early October when he was "found to be in AFib", but pacemaker interrogation shows that increased AF burden began being a problem in February 2017. He is anticoagulated, but INR was subtherapeutic on 12/1-12/2. Started on oral amiodarone and underwent DCCV on 01/03/16, but is in persistent atrial fibrillation on admission 01/15/16.  Assessment & Plan    AFib: it is actually unclear to what degree his arrhythmia is symptomatic.He did have some transient symptom improvement immediately after 11/16 DCCV. The high burden of AFib started months earlier. It is also unclear whether the side effects of amiodarone are not worse than the symptoms of the arrhythmia.  While it is not unreasonable to pursue another cardioversion, there are several issues: - the cardioversion should NOT be performed unless we have confidence that the warfarin anticoagulation will be continued without interruption for 30 days thereafter, due to the risk of embolic stroke (atrial mechanical stunning and thrombosis risk can last for weeks even after successful CV). Since warfarin will be stopped for AV graft placement, now is not the time. - the likelihood of maintenance of normal rhythm is low judging by his high burden of  AF even when on amiodarone. The likelihood is even lower during current acute renal failure/fluid overload problems. - there are no good antiarrhythmic alternatives to amiodarone due to his renal problems. - amiodarone has also caused extracardiac side effects. - for many reasons, he is a poor ablation candidate  In my opinion, now is not the time to perform a cardioversion. At least we should wait until he has the AV graft surgery and warfarin has been restarted (preferably after uninterrupted INR>2 for 3 weeks, but we could  accelerate that with a TEE if desired).   It is not unreasonable to stop amiodarone and "give up" on efforts to keep in normal rhythm, since the amiodarone may be causing more harm than benefit.  Will ask Dr. Odessa Fleming opinion as well.  Signed, Thurmon Fair, MD  01/22/2016, 5:32 PM

## 2016-01-22 NOTE — Progress Notes (Signed)
Internal Medicine Attending:   I saw and examined the patient. I reviewed the resident's note and I agree with the resident's findings and plan as documented in the resident's note.  Patient feels well today with no acute complaints. He was on CPAP overnight and is only requiring minimal oxygen supplementation. He continues to have minimal to no urine output despite IV Lasix diuresis. I suspect that he will require long-term hemodialysis as his kidneys do not seem to have recovered. We will continue with IV Lasix today at an increased dose and monitor urine output. Nephrology follow-up today. Patient scheduled for hemodialysis again in the a.m. if no improvement in urine output. Vascular surgery follow-up appreciated. Await results from vein mapping prior to long-term HD access placement.

## 2016-01-22 NOTE — Progress Notes (Signed)
Patient ID: [name], male   DOB: 28-Feb-1945, 70 y.o.   MRN: [id number] S:No new complaints but states he feels like he has to go to the bathroom but unable to void O:BP (!) 125/55 (BP Location: Right Arm)   Pulse 61   Temp 98.4 F (36.9 C) (Oral)   Resp 18   Ht 5\' 11"  (1.803 m)   Wt 115.6 kg (254 lb 14.4 oz)   SpO2 99%   BMI 35.55 kg/m   Intake/Output Summary (Last 24 hours) at 01/22/16 0831 Last data filed at 01/22/16 0600  Gross per 24 hour  Intake           1421.6 ml  Output             3062 ml  Net          -1640.4 ml   Intake/Output: I/O last 3 completed shifts: In: 1661.6 [P.O.:900; I.V.:637.6; IV Piggyback:124] Out: 3062 [Other:3062]  Intake/Output this shift:  No intake/output data recorded. Weight change: 0.3 kg (10.6 oz) Gen:WD obese WM CVS:no rub Resp:cta WUJ:WJXBJ +BS, soft, mild fullness in suprapubic area Ext:+edema   Recent Labs Lab 01/15/16 0849  01/17/16 0536 01/18/16 0300 01/18/16 1501 01/19/16 0423 01/20/16 0450 01/21/16 0711 01/21/16 1521 01/22/16 0453  NA 140  < > 139 136  --  135 133* 131* 134* 131*  K 3.9  < > 4.1 3.9  --  4.1 4.2 4.4 3.6 3.6  CL 107  < > 104 100*  --  99* 97* 96* 96* 92*  CO2 22  < > 23 22  --  24 24 19* 25 28  GLUCOSE 47*  < > 116* 178*  --  200* 265* 312* 206* 284*  BUN 64*  < > 71* 72*  --  49* 36* 51* 19 30*  CREATININE 3.58*  < > 3.82* 3.95*  --  3.42* 3.64* 5.34* 3.38* 4.41*  ALBUMIN 2.9*  --   --   --   --   --   --   --  2.5* 2.3*  CALCIUM 9.3  < > 9.0 9.0  --  8.8* 8.8* 9.0 8.6* 8.9  PHOS  --   --   --   --   --   --   --   --  3.3 4.8*  AST 17  --   --   --   --   --   --   --   --   --   ALT 13*  --   --   --  19  --   --   --   --   --   < > = values in this interval not displayed. Liver Function Tests:  Recent Labs Lab 01/15/16 0849 01/18/16 1501 01/21/16 1521 01/22/16 0453  AST 17  --   --   --   ALT 13* 19  --   --   ALKPHOS 82  --   --   --   BILITOT 0.6  --   --   --   PROT 7.6  --    --   --   ALBUMIN 2.9*  --  2.5* 2.3*   No results for input(s): LIPASE, AMYLASE in the last 168 hours. No results for input(s): AMMONIA in the last 168 hours. CBC:  Recent Labs Lab 01/15/16 0849  01/18/16 0300 01/19/16 0423 01/20/16 0450 01/21/16 0711 01/22/16 0446  WBC 8.7  < > 9.0 8.8 9.4  9.9 9.2  NEUTROABS 6.3  --   --   --   --   --   --   HGB 8.5*  < > 7.5* 7.5* 7.6* 8.1* 7.7*  HCT 28.0*  < > 24.6* 24.7* 25.4* 26.4* 25.4*  MCV 90.9  < > 92.1 91.8 92.0 91.3 92.4  PLT 238  < > 203 196 203 193 205  < > = values in this interval not displayed. Cardiac Enzymes:  Recent Labs Lab 01/15/16 1528 01/15/16 2040  TROPONINI <0.03 <0.03   CBG:  Recent Labs Lab 01/20/16 2205 01/21/16 0709 01/21/16 1559 01/21/16 2138 01/22/16 0722  GLUCAP 253* 301* 208* 263* 262*    Iron Studies:  Recent Labs  01/19/16 1720  IRON 35*  TIBC 244*   Studies/Results: No results found. Marland Kitchen. atorvastatin  40 mg Oral QHS  . cholecalciferol  2,000 Units Oral Daily  . darbepoetin (ARANESP) injection - DIALYSIS  100 mcg Intravenous Q Sat-HD  . ferrous sulfate  325 mg Oral Q breakfast  . folic acid  1 mg Oral Daily  . furosemide  120 mg Intravenous BID  . insulin aspart  0-15 Units Subcutaneous TID WC  . insulin glargine  10 Units Subcutaneous QHS  . ipratropium-albuterol  3 mL Nebulization TID  . levothyroxine  37.5 mcg Oral QAC breakfast  . mouth rinse  15 mL Mouth Rinse BID  . metoprolol  12.5 mg Oral BID  . pantoprazole  40 mg Oral Daily  . senna-docusate  2 tablet Oral QHS  . sertraline  100 mg Oral Daily  . sodium chloride flush  3 mL Intravenous Q12H  . thiamine  100 mg Oral Daily  . Warfarin - Pharmacist Dosing Inpatient   Does not apply q1800    BMET    Component Value Date/Time   NA 131 (L) 01/22/2016 0453   K 3.6 01/22/2016 0453   CL 92 (L) 01/22/2016 0453   CO2 28 01/22/2016 0453   GLUCOSE 284 (H) 01/22/2016 0453   BUN 30 (H) 01/22/2016 0453   CREATININE 4.41 (H)  01/22/2016 0453   CALCIUM 8.9 01/22/2016 0453   GFRNONAA 12 (L) 01/22/2016 0453   GFRAA 14 (L) 01/22/2016 0453   CBC    Component Value Date/Time   WBC 9.2 01/22/2016 0446   RBC 2.75 (L) 01/22/2016 0446   HGB 7.7 (L) 01/22/2016 0446   HCT 25.4 (L) 01/22/2016 0446   PLT 205 01/22/2016 0446   MCV 92.4 01/22/2016 0446   MCH 28.0 01/22/2016 0446   MCHC 30.3 01/22/2016 0446   RDW 20.2 (H) 01/22/2016 0446   LYMPHSABS 1.5 01/15/2016 0849   MONOABS 0.6 01/15/2016 0849   EOSABS 0.3 01/15/2016 0849   BASOSABS 0.0 01/15/2016 0849     Assessment/Plan:  1. 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  Likely due to underlying DM/HTN kidney disease with AKI related to decompensated diastolic CHF/cardiorenal syndrome.  S/p HD x 2 with improved volume.  Unclear if he will regain renal function, will continue to follow.  Baseline Scr appears to be in the 3's.  1. Unfortunately he has not responded at all to IV Lasix.  Will increase dose and frequency and follow.  2. If not response will plan for HD 01/23/16 3. Check bladder scan due to sense of fullness but inability to void.  If pvr >200 place foley 2. Diastolic CHF- with anasarca.  Unresponsive to IV diuresis.  Improved with HD and UF.  S/p HD x 3 will attempt  IV lasix and follow UOP over the next 48 hours before another session of HD.  3. A fib on amiodarone s/p DC cardioversion on 01/03/16 by Dr. Shirlee LatchMcLean. 4. Complete heart block- S/p Permanent pacemaker 5. Pyuria-  6. HTN- stable 7. DM per primary 8. Anemia of CKD- started on ESA 9. SHPTH- will follow Ca/Phos and check iPTH.  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 10. Disposition- unclear if he will require longterm HD.  If so he will need outpatient HD arrangements and will need an AVF/AVG even if he recovers renal function given his advanced CKD at baseline.  Irena CordsJoseph A. Krew Hortman, MD [facility] [phone]

## 2016-01-22 NOTE — Progress Notes (Signed)
Physical Therapy Treatment Patient Details Name: Zachary CleverlyMichael D Espin MRN: [id number] DOB: 03-13-1945 Today's Date: 01/22/2016    History of Present Illness Pt is a 70 y/o male admitted secondary to abdominal tightness and SOB, found to have pneumonia. PMH including but not limited to CHF, CKD, DM and pacemaker placement in 2014.    PT Comments    Pt with very ataxic gait and requiring mod A to ambulate. Has a w/c at home to use on bad days.  Follow Up Recommendations  Home health PT;Supervision/Assistance - 24 hour (Pt is refusing SNF)     Equipment Recommendations  None recommended by PT    Recommendations for Other Services       Precautions / Restrictions Precautions Precautions: Fall Restrictions Weight Bearing Restrictions: No    Mobility  Bed Mobility Overal bed mobility: Needs Assistance Bed Mobility: Supine to Sit;Sit to Supine     Supine to sit: Min assist;HOB elevated     General bed mobility comments: Assist to pull trunk up into sitting  Transfers Overall transfer level: Needs assistance Equipment used: Rolling walker (2 wheeled) Transfers: Sit to/from Stand Sit to Stand: +2 safety/equipment;Mod assist         General transfer comment: Assist to bring hips up and for balance.  Ambulation/Gait Ambulation/Gait assistance: Mod assist;+2 safety/equipment Ambulation Distance (Feet): 25 Feet Assistive device: Rolling walker (2 wheeled) Gait Pattern/deviations: Step-through pattern;Decreased step length - right;Decreased step length - left;Ataxic;Staggering right;Staggering left;Leaning posteriorly;Wide base of support Gait velocity: decr Gait velocity interpretation: Below normal speed for age/gender General Gait Details: Pt with ataxic gait with very poor balance. Required close follow with the chair. Amb on RA with SpO2 of 94% after amb   Stairs            Wheelchair Mobility    Modified Rankin (Stroke Patients Only)       Balance  Overall balance assessment: Needs assistance Sitting-balance support: Feet supported;No upper extremity supported Sitting balance-Leahy Scale: Fair     Standing balance support: Bilateral upper extremity supported Standing balance-Leahy Scale: Poor Standing balance comment: walker and min A for static standing. Mod assist for any dynamic                    Cognition Arousal/Alertness: Awake/alert Behavior During Therapy: WFL for tasks assessed/performed Overall Cognitive Status: Within Functional Limits for tasks assessed                      Exercises      General Comments        Pertinent Vitals/Pain Pain Assessment: No/denies pain    Home Living                      Prior Function            PT Goals (current goals can now be found in the care plan section) Progress towards PT goals: Progressing toward goals    Frequency    Min 3X/week      PT Plan Discharge plan needs to be updated    Co-evaluation             End of Session Equipment Utilized During Treatment: Gait belt Activity Tolerance: Patient tolerated treatment well Patient left: with call bell/phone within reach;with family/visitor present;in chair     Time: [id number] PT Time Calculation (min) (ACUTE ONLY): 21 min  Charges:  $Gait Training: 8-22 mins  G CodesAngelina Ok:      Elek Holderness W [place] 01/22/2016, 12:52 PM [employer] CorporationCary Vernice Bowker PT [phone]

## 2016-01-22 NOTE — Progress Notes (Signed)
   Subjective: Patient was evaluated this morning on rounds. He states that his breathing continues to improve. He fells well overall today.  Objective:  Vital signs in last 24 hours: Vitals:   01/21/16 2142 01/21/16 2240 01/22/16 0015 01/22/16 0510  BP:   (!) 125/50 (!) 125/55  Pulse: 62  62 61  Resp: 18  19 18   Temp:   98.5 F (36.9 C) 98.4 F (36.9 C)  TempSrc:   Oral Oral  SpO2: 100% 95% 96% 99%  Weight:    254 lb 14.4 oz (115.6 kg)  Height:       Physical Exam  Cardiovascular: Normal rate and regular rhythm.   Pulmonary/Chest: Effort normal. He has wheezes.  Bibasilar crackles  Abdominal: He exhibits no distension. There is no tenderness.  Musculoskeletal: He exhibits no edema.  Skin: Skin is warm and dry.    Assessment/Plan:  Principal Problem:   Acute on chronic diastolic CHF (congestive heart failure) (HCC) Active Problems:   Chronic kidney disease (CKD), stage III (moderate)   Cardiac pacemaker in situ   Hypothyroidism   CHF (congestive heart failure) (HCC)   Pneumonia of right upper lobe due to infectious organism [facility](HCC)   History of complete heart block   Chronic anticoagulation   OSA (obstructive sleep apnea)   Pressure injury of skin   AKI (acute kidney injury) (HCC)   Community acquired pneumonia of right upper lobe of lung (HCC)   COPD (chronic obstructive pulmonary disease) (HCC)   Acute on chronic renal failure (HCC)   Acute renal failure superimposed on chronic kidney disease (HCC)  Acute on chronic diastolic CHF Patient states that his breathing continues to improve. Patient had dialysis yesterday and had 3 L of fluid removed. He was started on Lasix 120 BID and has not had urine output today. Increase Lasix to 160mg  TID and monitor for response.  If patient does not have urine output may need a tunnel catheter for dialysis.  Patient has only been requiring 2 L of nasal cannula in the day and uses CPAP at night for his OSA. - Hemodialysis  -  Monitor I/Os - daily weights - CPAP QHS  Atrial fibrillation  S/P DCCV on 11/16. Appreciate cardiology's recommendations.Patient has a regular rate and rhythm. Heart rates are in the 60s. Warfarin is on board and INR is 2.68.  - continue amiodarone  - continue warfarin  Chronic kidney disease stage IV Appreciate nephrology's recommendations.  Patient is currently dialyzing via a right IJ temporary cath. Vascular was consulted yesterday to start vein mapping for fistula/graft. If patient does not have urine output in the next 24 hours he may need a tunnel catheter which can be placed at the same time as the fistula/graft. Condom catheter is in place.  Patient has no urinary complaints -Temporary hemodialysis  Type 2 diabetes  Glucose 284.  Lantus was restarted yesterday.  Increase to 20units QHS -SSI - Lantus at 20units QHS  Anemia of chronic disease Hb 7.7 and stable. 8.5 on admit,normal MCV. Baseline is around 9. - Continue home ferrous sulfate - Trend CBC, transfusion threshold 7  HTN 125/55and stable - continue reducedhome metoprolol to 12.5mg  BID - continue to hold home amlodipine  HLD -continue home atorvastatin  Hypothyroidism: Last TSH 8.141 on 11/9 -Continue home Synthroid 25mcg   Dispo: Anticipated discharge in approximately 2 day(s).   Zachary PhenesJessica Ratliff Karan Inclan, DO 01/22/2016, 7:54 AM Pager: [phone]

## 2016-01-23 DIAGNOSIS — D638 Anemia in other chronic diseases classified elsewhere: Secondary | ICD-10-CM

## 2016-01-23 DIAGNOSIS — I10 Essential (primary) hypertension: Secondary | ICD-10-CM

## 2016-01-23 LAB — CBC
HCT: 26.2 % — ABNORMAL LOW (ref 39.0–52.0)
HEMOGLOBIN: 7.8 g/dL — AB (ref 13.0–17.0)
MCH: 27.6 pg (ref 26.0–34.0)
MCHC: 29.8 g/dL — AB (ref 30.0–36.0)
MCV: 92.6 fL (ref 78.0–100.0)
Platelets: 206 10*3/uL (ref 150–400)
RBC: 2.83 MIL/uL — ABNORMAL LOW (ref 4.22–5.81)
RDW: 20.4 % — AB (ref 11.5–15.5)
WBC: 9.6 10*3/uL (ref 4.0–10.5)

## 2016-01-23 LAB — RENAL FUNCTION PANEL
ALBUMIN: 2.2 g/dL — AB (ref 3.5–5.0)
Anion gap: 13 (ref 5–15)
BUN: 41 mg/dL — AB (ref 6–20)
CALCIUM: 8.8 mg/dL — AB (ref 8.9–10.3)
CO2: 24 mmol/L (ref 22–32)
CREATININE: 5.8 mg/dL — AB (ref 0.61–1.24)
Chloride: 94 mmol/L — ABNORMAL LOW (ref 101–111)
GFR calc Af Amer: 10 mL/min — ABNORMAL LOW (ref >60)
GFR calc non Af Amer: 9 mL/min — ABNORMAL LOW (ref >60)
GLUCOSE: 275 mg/dL — AB (ref 65–99)
PHOSPHORUS: 5.2 mg/dL — AB (ref 2.5–4.6)
Potassium: 3.6 mmol/L (ref 3.5–5.1)
SODIUM: 131 mmol/L — AB (ref 135–145)

## 2016-01-23 LAB — GLUCOSE, CAPILLARY
Glucose-Capillary: 266 mg/dL — ABNORMAL HIGH (ref 65–99)
Glucose-Capillary: 233 mg/dL — ABNORMAL HIGH (ref 65–99)
Glucose-Capillary: 200 mg/dL — ABNORMAL HIGH (ref 65–99)

## 2016-01-23 LAB — PROTIME-INR
INR: 2.78
PROTHROMBIN TIME: 29.9 s — AB (ref 11.4–15.2)

## 2016-01-23 MED ORDER — INSULIN GLARGINE 100 UNIT/ML ~~LOC~~ SOLN
30.0000 [IU] | Freq: Every day | SUBCUTANEOUS | Status: DC
  Administered 2016-01-23: 30 [IU] via SUBCUTANEOUS
  Filled 2016-01-23 (×2): qty 0.3

## 2016-01-23 MED ORDER — CEFUROXIME SODIUM 1.5 G IJ SOLR: 1.5000 g | INTRAMUSCULAR | Status: DC

## 2016-01-23 MED ORDER — WARFARIN SODIUM 2 MG PO TABS: 1.0000 mg | ORAL_TABLET | Freq: Once | ORAL | Status: DC

## 2016-01-23 NOTE — Progress Notes (Signed)
Physical Therapy Treatment Patient Details Name: Zachary CleverlyMichael D Ravert MRN: [id number] DOB: 1945-02-24 Today's Date: 01/23/2016    History of Present Illness Pt is a 70 y/o male admitted secondary to abdominal tightness and SOB, found to have pneumonia. PMH including but not limited to CHF, CKD, DM and pacemaker placement in 2014.    PT Comments    Pt presented supine in bed with HOB elevated, awake and willing to participate in therapy session. Pt's wife was present throughout session as well. Pt continues to be very limited secondary to fatigue and requires mod A during gait secondary to ataxia. Pt would continue to benefit from skilled physical therapy services at this time while admitted and after d/c to address his limitations in order to improve his overall safety and independence with functional mobility.   Follow Up Recommendations  Home health PT;Supervision/Assistance - 24 hour (pt refusing SNF)     Equipment Recommendations  None recommended by PT    Recommendations for Other Services       Precautions / Restrictions Precautions Precautions: Fall Restrictions Weight Bearing Restrictions: No    Mobility  Bed Mobility Overal bed mobility: Needs Assistance Bed Mobility: Supine to Sit     Supine to sit: Min guard;HOB elevated     General bed mobility comments: pt required increased time, use of bed rails, min guard for safety  Transfers Overall transfer level: Needs assistance Equipment used: Rolling walker (2 wheeled) Transfers: Sit to/from Stand Sit to Stand: Mod assist;+2 physical assistance;+2 safety/equipment         General transfer comment: pt required increased time, VC'ing for bilateral hand placement and mod A x2 to achieve full standing from bed  Ambulation/Gait Ambulation/Gait assistance: Mod assist;+2 safety/equipment Ambulation Distance (Feet): 20 Feet Assistive device: Rolling walker (2 wheeled) Gait Pattern/deviations: Step-through  pattern;Decreased stride length;Ataxic;Staggering right;Staggering left Gait velocity: decreased Gait velocity interpretation: Below normal speed for age/gender General Gait Details: pt very unsteady with ambulation requiring mod A throughout for safety and to maintain upright balance; close follow with recliner secondary to fatigue   Stairs            Wheelchair Mobility    Modified Rankin (Stroke Patients Only)       Balance Overall balance assessment: Needs assistance Sitting-balance support: Feet supported;No upper extremity supported Sitting balance-Leahy Scale: Fair     Standing balance support: During functional activity;Bilateral upper extremity supported Standing balance-Leahy Scale: Poor Standing balance comment: pt reliant on bilateral UEs on RW and required min A for static standing; mod A for dynamic                    Cognition Arousal/Alertness: Awake/alert Behavior During Therapy: WFL for tasks assessed/performed Overall Cognitive Status: Within Functional Limits for tasks assessed                      Exercises      General Comments        Pertinent Vitals/Pain Pain Assessment: No/denies pain    Home Living                      Prior Function            PT Goals (current goals can now be found in the care plan section) Acute Rehab PT Goals PT Goal Formulation: With patient/family Time For Goal Achievement: 02/02/16 Potential to Achieve Goals: Fair Progress towards PT goals: Progressing toward goals  Frequency    Min 3X/week      PT Plan Current plan remains appropriate    Co-evaluation             End of Session Equipment Utilized During Treatment: Gait belt Activity Tolerance: Patient limited by fatigue Patient left: in chair;with call bell/phone within reach;with family/visitor present     Time: 1020-1036 PT Time Calculation (min) (ACUTE ONLY): 16 min  Charges:  $Gait Training: 8-22  mins                    G CodesAlessandra Bevels:      Mischa Pollard M Toneshia Coello 01/23/2016, 11:45 AM Deborah ChalkJennifer Aran Menning, PT, DPT [phone]

## 2016-01-23 NOTE — Progress Notes (Signed)
Internal Medicine Attending:   I saw and examined the patient. I reviewed the resident's note and I agree with the resident's findings and plan as documented in the resident's note.  Patient feels well today with no new complaints. He continues to have minimal to no urine output despite diuresis with IV Lasix. Will await nephrology follow-up but I suspect that he will require long-term hemodialysis and placement of a tunneled HD catheter. Would consider holding Coumadin as he will need placement of an AV fistula/graft as well. We'll continue with hemodialysis for now per nephrology. Patient's hypoxia is now resolved and he is able to maintain his oxygen saturations on room air.  Patient is not a candidate for cardioversion at this time given that he will need to be on uninterrupted anticoagulation for at least 30 days after procedure. This is not feasible at this time given that he will need access for hemodialysis placed. We'll continue with oral amiodarone for now but it is unlikely that he will revert back to normal sinus rhythm. We will consider discontinuing this medication after discussion with cardiology.

## 2016-01-23 NOTE — Progress Notes (Signed)
Inpatient Diabetes Program Recommendations  AACE/ADA: New Consensus Statement on Inpatient Glycemic Control (2015)  Target Ranges:  Prepandial:   less than 140 mg/dL      Peak postprandial:   less than 180 mg/dL (1-2 hours)      Critically ill patients:  140 - 180 mg/dL   Results for [name], Jasraj D (MRN [id number]) as of 01/23/2016 08:31  Ref. Range 01/22/2016 07:22 01/22/2016 11:51 01/22/2016 17:06 01/22/2016 20:56 01/23/2016 05:50  Glucose-Capillary Latest Ref Range: 65 - 99 mg/dL 409262 (H) 811260 (H) 914252 (H) 228 (H) 266 (H)   Review of Glycemic Control  Diabetes history: DM2 Outpatient Diabetes medications: Lantus 52 units QHS, Novolog 20 units TID with meals Current orders for Inpatient glycemic control: Lantus 30 units QHS, Novolog 0-15 units TID with meals  Inpatient Diabetes Program Recommendations: Insulin - Basal: Noted Lantus was increased from 20 units to 30 units this morning. Insulin - Meal Coverage: If post prandial glucose continues to be elevated despite increase in Lantus, please consider ordering Novolog 5 units TID with meals for meal coverage. Insulin-Correction: Please consider ordering Novolog bedtime correction scale.  Thanks, Orlando PennerMarie Damyen Knoll, RN, MSN, CDE Diabetes Coordinator Inpatient Diabetes Program [phone] (Team Pager from 8am to 5pm)

## 2016-01-23 NOTE — Progress Notes (Signed)
Subjective: Patient was evaluated this morning on rounds. He states that his breathing is stable. He has no further complaints. He denies chest pain or palpitations.   Objective:  Vital signs in last 24 hours: Vitals:   01/22/16 1548 01/22/16 2100 01/22/16 2143 01/23/16 0342  BP:  (!) 121/59  (!) 113/46  Pulse:  61  64  Resp:  18  18  Temp:  97.8 F (36.6 C)  98 F (36.7 C)  TempSrc:  Oral  Oral  SpO2: 99% 99% 100% 94%  Weight:    251 lb 3.2 oz (113.9 kg)  Height:       Physical Exam  Constitutional: No distress.  Cardiovascular: Normal rate and regular rhythm.  Exam reveals no gallop and no friction rub.   No murmur heard. Pulmonary/Chest: Effort normal.  Slight wheezing noted  Bibasilar crackles present  Abdominal: Soft. He exhibits no distension. There is no tenderness.  Musculoskeletal:  1+ pitting edema in lower extremities bilaterally   Skin: Skin is warm and dry.    Assessment/Plan:  Principal Problem:   Acute on chronic diastolic CHF (congestive heart failure) (HCC) Active Problems:   Chronic kidney disease (CKD), stage III (moderate)   Cardiac pacemaker in situ   Hypothyroidism   CHF (congestive heart failure) (HCC)   Pneumonia of right upper lobe due to infectious organism [facility](HCC)   History of complete heart block   Chronic anticoagulation   OSA (obstructive sleep apnea)   Pressure injury of skin   AKI (acute kidney injury) (HCC)   Community acquired pneumonia of right upper lobe of lung (HCC)   COPD (chronic obstructive pulmonary disease) (HCC)   Acute on chronic renal failure (HCC)   Acute renal failure superimposed on chronic kidney disease (HCC)  Acute on chronic diastolic CHF Patient states that his breathing continues to improve. Patient had an Increase in Lasix to 160mg  TID and has not had urine output overnight. May need to consider placing a tunnel catheter inpatient for long-term dialysis.  Currently weighs 251lb from 274lb on admission.   Patient has only been requiring 2 L of nasal cannula in the day and uses CPAP at night for his OSA.  Will try and wean oxygen off today.  Plan for potential HD today. - Hemodialysis  - Monitor I/Os - daily weights - CPAP QHS  Atrial fibrillation  S/P DCCV on 11/16. Appreciate cardiology's recommendations.Patient has a regular rate and rhythm. Heart rates are in the 60s.  INR is 2.78.  Per cardiology note may consider stopping amiodarone as side effects may outweigh the benefit of rhythm control at this point.  Since patient is going to require a fistula/graft then cardioversion will need to be considered after the procedure is done.  Either 3 weeks of uninterpreted anticoagulation with INR >2 or could be accerlerated with a TEE. - holding warfarin in anticipation of graft/fistula placement and tunnel catheter  Chronic kidney disease stage IV Appreciate nephrology's recommendations.  Patient is currently dialyzing via a right IJ temporary cath. Vein mapping for fistula/graft has been completed. Patient has not had urine output in the last 2 days. He may need a tunnel catheter which can be placed at the same time as the fistula/graft. Condom catheter is in place. Patient has no urinary complaints.  Holding warfarin today in anticipation of fistula/graft placement. - continue Temporary hemodialysis  Type 2 diabetes  Glucose 275.  Lantus was restarted yesterday.  Increase to home dose of 30 units QHS -SSI -  Lantus at 30unitsQHS  Anemia of chronic disease Hb 7.8 and stable. 8.5 on admit,normal MCV. Baseline is around 9. - Continue home ferrous sulfate - Trend CBC, transfusion threshold 7  HTN 113/46and stable - continue reducedhome metoprolol to 12.5mg  BID - continue to hold home amlodipine  HLD -continue home atorvastatin  Hypothyroidism: Last TSH 8.141 on 11/9 -Continue home Synthroid 25mcg  DVT prophylaxis: SCDs Code status:  Full Diet:  Dysphagia 3  Dispo:  Pending clinical improvement.     Camelia PhenesJessica Ratliff Shilynn Hoch, DO 01/23/2016, 7:23 AM Pager: [phone]

## 2016-01-23 NOTE — Progress Notes (Addendum)
ANTICOAGULATION CONSULT NOTE   Pharmacy Consult for warfarin Indication: atrial fibrillation No Known Allergies  Patient Measurements: Height: 5\' 11"  (180.3 cm) Weight: 251 lb 3.2 oz (113.9 kg) (bedscale) IBW/kg (Calculated) : 75.3 Heparin Dosing Weight: 103.3 kg   Vital Signs: Temp: 98 F (36.7 C) (12/06 0342) Temp Source: Oral (12/06 0342) BP: 113/46 (12/06 0342) Pulse Rate: 64 (12/06 0342)   Assessment: [name] is a 70 yo male who was on Coumadin 2.5mg  on Tues/Wed/Thurs/Sat/Sun and no dose on Mon/Fri. INR elevated on admit at 4.68. Patient did have some bleeding from IV site on 11/29. Vit K was given. Then tried to bridge with heparin but still had significant bleeding from IV site so heparin stopped. Now INR is therapeutic at 2.78. Hgb low at 7.8, plts wnl. No bleeding noted since 12/1.  Goal of Therapy:  INR 2-3  Monitor platelets by anticoagulation protocol: Yes   Plan:  Holding Coumadin today for AV graft placement soon  Consider reduced weekly dose on discharge as patient has been receiving lower doses while inpatient with therapeutic INR  [name], PharmD, BCPS Clinical Pharmacist Pager [phone] 01/23/2016 8:02 AM

## 2016-01-23 NOTE — Progress Notes (Signed)
Patient ID: [name], male   DOB: 1945-06-16, 70 y.o.   MRN: [id number] S:Feeling better and able to walk to hall and back  O:BP (!) 113/46 (BP Location: Left Arm)   Pulse 64   Temp 98 F (36.7 C) (Oral)   Resp 18   Ht 5\' 11"  (1.803 m)   Wt 113.9 kg (251 lb 3.2 oz) Comment: bedscale  SpO2 96%   BMI 35.04 kg/m   Intake/Output Summary (Last 24 hours) at 01/23/16 0823 Last data filed at 01/23/16 0200  Gross per 24 hour  Intake           344.91 ml  Output                0 ml  Net           344.91 ml   Intake/Output: I/O last 3 completed shifts: In: 1320.4 [P.O.:900; I.V.:292.4; IV Piggyback:128] Out: 0   Intake/Output this shift:  No intake/output data recorded. Weight change: -1.757 kg (-3 lb 14 oz) Gen:WD obese WM in NAD CVS:no rub Resp:bibasilar crackles and occ rhonchi WGN:FAOZHAbd:obese, +BS Ext:tr edema   Recent Labs Lab 01/18/16 0300 01/18/16 1501 01/19/16 0423 01/20/16 0450 01/21/16 0711 01/21/16 1521 01/22/16 0453 01/23/16 0417  NA 136  --  135 133* 131* 134* 131* 131*  K 3.9  --  4.1 4.2 4.4 3.6 3.6 3.6  CL 100*  --  99* 97* 96* 96* 92* 94*  CO2 22  --  24 24 19* 25 28 24   GLUCOSE 178*  --  200* 265* 312* 206* 284* 275*  BUN 72*  --  49* 36* 51* 19 30* 41*  CREATININE 3.95*  --  3.42* 3.64* 5.34* 3.38* 4.41* 5.80*  ALBUMIN  --   --   --   --   --  2.5* 2.3* 2.2*  CALCIUM 9.0  --  8.8* 8.8* 9.0 8.6* 8.9 8.8*  PHOS  --   --   --   --   --  3.3 4.8* 5.2*  ALT  --  19  --   --   --   --   --   --    Liver Function Tests:  Recent Labs Lab 01/18/16 1501 01/21/16 1521 01/22/16 0453 01/23/16 0417  ALT 19  --   --   --   ALBUMIN  --  2.5* 2.3* 2.2*   No results for input(s): LIPASE, AMYLASE in the last 168 hours. No results for input(s): AMMONIA in the last 168 hours. CBC:  Recent Labs Lab 01/19/16 0423 01/20/16 0450 01/21/16 0711 01/22/16 0446 01/23/16 0417  WBC 8.8 9.4 9.9 9.2 9.6  HGB 7.5* 7.6* 8.1* 7.7* 7.8*  HCT 24.7* 25.4* 26.4* 25.4*  26.2*  MCV 91.8 92.0 91.3 92.4 92.6  PLT 196 203 193 205 206   Cardiac Enzymes: No results for input(s): CKTOTAL, CKMB, CKMBINDEX, TROPONINI in the last 168 hours. CBG:  Recent Labs Lab 01/22/16 0722 01/22/16 1151 01/22/16 1706 01/22/16 2056 01/23/16 0550  GLUCAP 262* 260* 252* 228* 266*    Iron Studies: No results for input(s): IRON, TIBC, TRANSFERRIN, FERRITIN in the last 72 hours. Studies/Results: No results found. Marland Kitchen. amiodarone  200 mg Oral Daily  . atorvastatin  40 mg Oral QHS  . Chlorhexidine Gluconate Cloth  6 each Topical Q0600  . cholecalciferol  2,000 Units Oral Daily  . darbepoetin (ARANESP) injection - DIALYSIS  100 mcg Intravenous Q Sat-HD  . ferrous sulfate  325 mg  Oral Q breakfast  . folic acid  1 mg Oral Daily  . furosemide  160 mg Intravenous TID  . insulin aspart  0-15 Units Subcutaneous TID WC  . insulin glargine  30 Units Subcutaneous QHS  . ipratropium-albuterol  3 mL Nebulization TID  . levothyroxine  37.5 mcg Oral QAC breakfast  . mouth rinse  15 mL Mouth Rinse BID  . metoprolol  12.5 mg Oral BID  . mupirocin ointment  1 application Nasal BID  . pantoprazole  40 mg Oral Daily  . senna-docusate  2 tablet Oral QHS  . sertraline  100 mg Oral Daily  . sodium chloride flush  3 mL Intravenous Q12H  . thiamine  100 mg Oral Daily  . warfarin  1 mg Oral ONCE-1800  . Warfarin - Pharmacist Dosing Inpatient   Does not apply q1800    BMET    Component Value Date/Time   NA 131 (L) 01/23/2016 0417   K 3.6 01/23/2016 0417   CL 94 (L) 01/23/2016 0417   CO2 24 01/23/2016 0417   GLUCOSE 275 (H) 01/23/2016 0417   BUN 41 (H) 01/23/2016 0417   CREATININE 5.80 (H) 01/23/2016 0417   CALCIUM 8.8 (L) 01/23/2016 0417   GFRNONAA 9 (L) 01/23/2016 0417   GFRAA 10 (L) 01/23/2016 0417   CBC    Component Value Date/Time   WBC 9.6 01/23/2016 0417   RBC 2.83 (L) 01/23/2016 0417   HGB 7.8 (L) 01/23/2016 0417   HCT 26.2 (L) 01/23/2016 0417   PLT 206 01/23/2016 0417    MCV 92.6 01/23/2016 0417   MCH 27.6 01/23/2016 0417   MCHC 29.8 (L) 01/23/2016 0417   RDW 20.4 (H) 01/23/2016 0417   LYMPHSABS 1.5 01/15/2016 0849   MONOABS 0.6 01/15/2016 0849   EOSABS 0.3 01/15/2016 0849   BASOSABS 0.0 01/15/2016 0849     Assessment/Plan:  1. 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 Likely due to underlying DM/HTN kidney disease with AKI related to decompensated diastolic CHF/cardiorenal syndrome. S/p HD x 3 with improved volume. Unclear if he will regain renal function, will continue to follow. Baseline Scr appears to be in the 3's.  1. Unfortunately he has not responded at all to IV Lasix.  Have increased dose and frequency and follow.  2. bladder scan only had 31ml yesterday 3. Plan for HD today due to lack of response form high dose IV lasix. 2. Diastolic CHF- with anasarca. Unresponsive to IV diuresis. Improved with HD and UF. S/p HD x 3 will attempt IV lasix and follow UOP over the next 48 hours before another session of HD.  3. A fib on amiodarone s/p DC cardioversion on 01/03/16 by Dr. Shirlee LatchMcLean. 4. Complete heart block- S/p Permanent pacemaker 5. Pyuria-  6. HTN- stable 7. DM per primary 8. Anemia of CKD- started on ESA 9. SHPTH- will follow Ca/Phos and check iPTH. 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 10. Disposition- unclear if he will require longterm HD. If so he will need outpatient HD arrangements and will need a tunneled HD cath for now and an AVF/AVG even if he recovers renal function given his advanced CKD at baseline.  Irena CordsJoseph A. Tahji [place], MD [facility] [phone]

## 2016-01-23 NOTE — Progress Notes (Signed)
Will plan for HD catheter and right arm access on Friday as long as his INR is corrected.  Continue to hold coumadin.  Daily INR's.    Doreatha MassedSamantha Margret Moat, [facility] 01/23/2016 1:18 PM

## 2016-01-23 NOTE — Progress Notes (Signed)
Patient Name: [name] Date of Encounter: 01/23/2016  Primary Cardiologist: Dr. Tilley/Dr. [facility] Problem List     Principal Problem:   Acute on chronic diastolic CHF (congestive heart failure) (HCC) Active Problems:   IDDM (insulin dependent diabetes mellitus) (HCC)   Obesity (BMI 30-39.9)   Cardiac pacemaker in situ   Hypothyroidism   History of complete heart block   Chronic anticoagulation   OSA (obstructive sleep apnea)   Pressure injury of skin   COPD (chronic obstructive pulmonary disease) (HCC)   Acute renal failure superimposed on chronic kidney disease (HCC)   Essential hypertension   Anemia of chronic disease     Subjective   Feels tired, going for HD today.   Inpatient Medications    Scheduled Meds: . amiodarone  200 mg Oral Daily  . atorvastatin  40 mg Oral QHS  . Chlorhexidine Gluconate Cloth  6 each Topical Q0600  . cholecalciferol  2,000 Units Oral Daily  . darbepoetin (ARANESP) injection - DIALYSIS  100 mcg Intravenous Q Sat-HD  . ferrous sulfate  325 mg Oral Q breakfast  . folic acid  1 mg Oral Daily  . furosemide  160 mg Intravenous TID  . insulin aspart  0-15 Units Subcutaneous TID WC  . insulin glargine  30 Units Subcutaneous QHS  . ipratropium-albuterol  3 mL Nebulization TID  . levothyroxine  37.5 mcg Oral QAC breakfast  . mouth rinse  15 mL Mouth Rinse BID  . metoprolol  12.5 mg Oral BID  . mupirocin ointment  1 application Nasal BID  . pantoprazole  40 mg Oral Daily  . senna-docusate  2 tablet Oral QHS  . sertraline  100 mg Oral Daily  . sodium chloride flush  3 mL Intravenous Q12H  . thiamine  100 mg Oral Daily   Continuous Infusions:  PRN Meds: sodium chloride, acetaminophen, ondansetron (ZOFRAN) IV, sodium chloride flush, sodium chloride flush, THROMBI-PAD   Vital Signs    Vitals:   01/22/16 2143 01/23/16 0342 01/23/16 0748 01/23/16 0932  BP:  (!) 113/46    Pulse:  64    Resp:  18    Temp:  98 F (36.7  C)    TempSrc:  Oral    SpO2: 100% 94% 96% 95%  Weight:  251 lb 3.2 oz (113.9 kg)    Height:        Intake/Output Summary (Last 24 hours) at 01/23/16 1127 Last data filed at 01/23/16 0200  Gross per 24 hour  Intake           224.91 ml  Output                0 ml  Net           224.91 ml   Filed Weights   01/21/16 1220 01/22/16 0510 01/23/16 0342  Weight: 248 lb 14.4 oz (112.9 kg) 254 lb 14.4 oz (115.6 kg) 251 lb 3.2 oz (113.9 kg)    Physical Exam  GEN: Obese, well developed, in no acute distress.  HEENT: Grossly normal.  Neck: Supple, elevated JVD, no carotid bruits, goiter or masses. Cardiac: RRR, no murmurs, rubs, or gallops. No clubbing, cyanosis, edema.  Radials/DP/PT 2+ and equal bilaterally.  Respiratory:  Respirations regular and unlabored, clear to auscultation bilaterally. GI: Soft, nontender, nondistended, BS + x 4. MS: no deformity or atrophy. Skin: warm and dry, no rash. Neuro:  Strength and sensation are intact. Psych: AAOx3.  Normal affect.  Labs  CBC  Recent Labs  01/22/16 0446 01/23/16 0417  WBC 9.2 9.6  HGB 7.7* 7.8*  HCT 25.4* 26.2*  MCV 92.4 92.6  PLT 205 206   Basic Metabolic Panel  Recent Labs  01/22/16 0453 01/23/16 0417  NA 131* 131*  K 3.6 3.6  CL 92* 94*  CO2 28 24  GLUCOSE 284* 275*  BUN 30* 41*  CREATININE 4.41* 5.80*  CALCIUM 8.9 8.8*  PHOS 4.8* 5.2*   Liver Function Tests  Recent Labs  01/22/16 0453 01/23/16 0417  ALBUMIN 2.3* 2.2*     Telemetry    Afib, V paced - Personally Reviewed  ECG    Afib, V paced - Personally Reviewed  Radiology    No results found.  Cardiac Studies  Pacemaker download reviewed. 67.6% burden of AFib. He lasted in normal rhythm for about 48 hours after DCCV 11/16 (and reported feeling better), but then had AF recurrence. Had spontaneous return to normal (A paced) rhythm for a few days, then seem to be in uninterrupted AF since 11/25. Higher doses of amiodarone caused fatigue  and lethargy ("zombie").   Patient Profile     70 yo WM with history of AFib, CKD stage 4 approaching ESRD, CHB s/p PPM presents with worsening symptoms of dyspnea, edema, increased abdominal girth and has increased AFb=ib burden and oligoanuric renal failure.  Symptoms began in early October when he was "found to be in AFib", but pacemaker interrogation shows that increased AF burden began being a problem in February 2017. He is anticoagulated, but INR was subtherapeutic on 12/1-12/2. Started on oral amiodarone and underwent DCCV on 01/03/16, but is in persistent atrial fibrillation on admission 01/15/16.  Assessment & Plan    1. Persistent atrial fib: Please see Dr. Erin Hearingroitoru's note from yesterday:   "it is actually unclear to what degree his arrhythmia is symptomatic.He did have some transient symptom improvement immediately after 11/16 DCCV. The high burden of AFib started months earlier. It is also unclear whether the side effects of amiodarone are not worse than the symptoms of the arrhythmia.  While it is not unreasonable to pursue another cardioversion, there are several issues: - the cardioversion should NOT be performed unless we have confidence that the warfarin anticoagulation will be continued without interruption for 30 days thereafter, due to the risk of embolic stroke (atrial mechanical stunning and thrombosis risk can last for weeks even after successful CV). Since warfarin will be stopped for AV graft placement, now is not the time. - the likelihood of maintenance of normal rhythm is low judging by his high burden of AF even when on amiodarone. The likelihood is even lower during current acute renal failure/fluid overload problems. - there are no good antiarrhythmic alternatives to amiodarone due to his renal problems. - amiodarone has also caused extracardiac side effects. - for many reasons, he is a poor ablation candidate  In my opinion, now is not the time to perform a  cardioversion. At least we should wait until he has the AV graft surgery and warfarin has been restarted (preferably after uninterrupted INR>2 for 3 weeks, but we could accelerate that with a TEE if desired).   It is not unreasonable to stop amiodarone and "give up" on efforts to keep in normal rhythm, since the amiodarone may be causing more harm than benefit."    Signed, Little IshikawaErin E Smith, NP  01/23/2016, 11:27 AM   I have seen and examined the patient along with Little IshikawaErin E Smith, NP.  I have reviewed the chart, notes and new data.  I agree with PA/NP's note.  PLAN: For now, would like to ask Dr. Graciela HusbandsKlein about long term plans for maintenance of rhythm and use of amiodarone. No new recommendations other than described above.  Thurmon FairMihai Kashara Blocher, MD, [facility] [phone] 01/23/2016, 12:26 PM

## 2016-01-24 LAB — RENAL FUNCTION PANEL
ALBUMIN: 2.3 g/dL — AB (ref 3.5–5.0)
ANION GAP: 13 (ref 5–15)
BUN: 25 mg/dL — ABNORMAL HIGH (ref 6–20)
CALCIUM: 8.7 mg/dL — AB (ref 8.9–10.3)
CO2: 25 mmol/L (ref 22–32)
Chloride: 96 mmol/L — ABNORMAL LOW (ref 101–111)
Creatinine, Ser: 4.02 mg/dL — ABNORMAL HIGH (ref 0.61–1.24)
GFR calc non Af Amer: 14 mL/min — ABNORMAL LOW (ref >60)
GFR, EST AFRICAN AMERICAN: 16 mL/min — AB (ref >60)
GLUCOSE: 228 mg/dL — AB (ref 65–99)
PHOSPHORUS: 3.7 mg/dL (ref 2.5–4.6)
Potassium: 3.7 mmol/L (ref 3.5–5.1)
SODIUM: 134 mmol/L — AB (ref 135–145)

## 2016-01-24 LAB — PROTIME-INR
INR: 2.59
Prothrombin Time: 28.3 seconds — ABNORMAL HIGH (ref 11.4–15.2)

## 2016-01-24 LAB — GLUCOSE, CAPILLARY
GLUCOSE-CAPILLARY: 237 mg/dL — AB (ref 65–99)
GLUCOSE-CAPILLARY: 264 mg/dL — AB (ref 65–99)
Glucose-Capillary: 237 mg/dL — ABNORMAL HIGH (ref 65–99)
Glucose-Capillary: 243 mg/dL — ABNORMAL HIGH (ref 65–99)

## 2016-01-24 MED ORDER — INSULIN GLARGINE 100 UNIT/ML ~~LOC~~ SOLN
32.0000 [IU] | Freq: Every day | SUBCUTANEOUS | Status: DC
  Administered 2016-01-24: 32 [IU] via SUBCUTANEOUS
  Filled 2016-01-24 (×2): qty 0.32

## 2016-01-24 MED ORDER — CEFAZOLIN IN D5W 1 GM/50ML IV SOLN
1.0000 g | INTRAVENOUS | Status: AC
  Filled 2016-01-24 (×2): qty 50

## 2016-01-24 MED ORDER — CHLORHEXIDINE GLUCONATE CLOTH 2 % EX PADS: 6.0000 | MEDICATED_PAD | Freq: Once | CUTANEOUS | Status: DC

## 2016-01-24 NOTE — Progress Notes (Signed)
Pt is scheduled for right arm dialysis access and tunneled dialysis catheter placement tomorrow pending INR.  NPO and consent orders are placed.    Doreatha MassedSamantha Saria Haran, [facility] 01/24/2016 2:57 PM

## 2016-01-24 NOTE — Progress Notes (Signed)
RN spoke with MD to clarify lasix order.  Ordered to hold for now.

## 2016-01-24 NOTE — Progress Notes (Signed)
Patient ID: [name], [name]   [name], [name], 70 y.o.   MRN: [id number] S:Feels ok but a little sleepy O:BP (!) 98/58 (BP Location: Left Arm)   Pulse 62   Temp 97 F (36.1 C) (Oral)   Resp 18   Ht 5\' 11"  (1.803 m)   Wt 115.2 kg (253 lb 15.5 oz)   SpO2 98%   BMI 35.42 kg/m   Intake/Output Summary (Last 24 hours) at 12/[name]/17 0829 Last data filed at 12/[name]/17 0600  Gross per 24 hour  Intake             1120 ml  Output             2182 ml  Net            -1062 ml   Intake/Output: I/O last 3 completed shifts: In: 1243 [P.O.:1240; I.V.:3] Out: 2182 [Urine:200; Other:1982]  Intake/Output this shift:  No intake/output data recorded. Weight change: -0.744 kg (-1 lb 10.2 oz) Gen:WD obese WM in NAD CVS:no rub Resp:decreased BS at bases YNW:GNFAOAbd:obese, +BS, soft NT Ext:tr edema   Recent Labs Lab 01/18/16 1501 01/19/16 0423 01/20/16 0450 01/21/16 0711 01/21/16 1521 01/22/16 0453 01/23/16 0417 12/[name]/17 0416  NA  --  135 133* 131* 134* 131* 131* 134*  K  --  4.1 4.2 4.4 3.6 3.6 3.6 3.7  CL  --  99* 97* 96* 96* 92* 94* 96*  CO2  --  24 24 19* 25 28 24 25   GLUCOSE  --  200* 265* 312* 206* 284* 275* 228*  BUN  --  49* 36* 51* 19 30* 41* 25*  CREATININE  --  3.42* 3.64* 5.34* 3.38* 4.41* 5.80* 4.02*  ALBUMIN  --   --   --   --  2.5* 2.3* 2.2* 2.3*  CALCIUM  --  8.8* 8.8* 9.0 8.6* 8.9 8.8* 8.7*  PHOS  --   --   --   --  3.3 4.8* 5.2* 3.7  ALT 19  --   --   --   --   --   --   --    Liver Function Tests:  Recent Labs Lab 01/18/16 1501  01/22/16 0453 01/23/16 0417 12/[name]/17 0416  ALT 19  --   --   --   --   ALBUMIN  --   < > 2.3* 2.2* 2.3*  < > = values in this interval not displayed. No results for input(s): LIPASE, AMYLASE in the last 168 hours. No results for input(s): AMMONIA in the last 168 hours. CBC:  Recent Labs Lab 01/19/16 0423 01/20/16 0450 01/21/16 0711 01/22/16 0446 01/23/16 0417  WBC 8.8 9.4 9.9 9.2 9.6  HGB 7.5* 7.6* 8.1* 7.7* 7.8*  HCT 24.7*  25.4* 26.4* 25.4* 26.2*  MCV 91.8 92.0 91.3 92.4 92.6  PLT 196 203 193 205 206   Cardiac Enzymes: No results for input(s): CKTOTAL, CKMB, CKMBINDEX, TROPONINI in the last 168 hours. CBG:  Recent Labs Lab 01/22/16 2056 01/23/16 0550 01/23/16 1140 01/23/16 2120 12/[name]/17 0613  GLUCAP 228* 266* 233* 200* 237*    Iron Studies: No results for input(s): IRON, TIBC, TRANSFERRIN, FERRITIN in the last 72 hours. Studies/Results: No results found. Marland Kitchen. amiodarone  200 mg Oral Daily  . atorvastatin  40 mg Oral QHS  . Chlorhexidine Gluconate Cloth  6 each Topical Q0600  . cholecalciferol  2,000 Units Oral Daily  . darbepoetin (ARANESP) injection - DIALYSIS  100 mcg Intravenous Q Sat-HD  .  ferrous sulfate  325 mg Oral Q breakfast  . folic acid  1 mg Oral Daily  . furosemide  160 mg Intravenous TID  . insulin aspart  0-15 Units Subcutaneous TID WC  . insulin glargine  30 Units Subcutaneous QHS  . ipratropium-albuterol  3 mL Nebulization TID  . levothyroxine  37.5 mcg Oral QAC breakfast  . mouth rinse  15 mL Mouth Rinse BID  . metoprolol  12.5 mg Oral BID  . mupirocin ointment  1 application Nasal BID  . pantoprazole  40 mg Oral Daily  . senna-docusate  2 tablet Oral QHS  . sertraline  100 mg Oral Daily  . sodium chloride flush  3 mL Intravenous Q12H  . thiamine  100 mg Oral Daily    BMET    Component Value Date/Time   NA 134 (L) 12/[name]/2017 0416   K 3.7 12/[name]/2017 0416   CL 96 (L) 12/[name]/2017 0416   CO2 25 12/[name]/2017 0416   GLUCOSE 228 (H) 12/[name]/2017 0416   BUN 25 (H) 12/[name]/2017 0416   CREATININE 4.02 (H) 12/[name]/2017 0416   CALCIUM 8.7 (L) 12/[name]/2017 0416   GFRNONAA 14 (L) 12/[name]/2017 0416   GFRAA 16 (L) 12/[name]/2017 0416   CBC    Component Value Date/Time   WBC 9.6 01/23/2016 0417   RBC 2.83 (L) 01/23/2016 0417   HGB 7.8 (L) 01/23/2016 0417   HCT 26.2 (L) 01/23/2016 0417   PLT 206 01/23/2016 0417   MCV 92.6 01/23/2016 0417   MCH 27.6 01/23/2016 0417   MCHC 29.8 (L)  01/23/2016 0417   RDW 20.4 (H) 01/23/2016 0417   LYMPHSABS 1.5 01/15/2016 0849   MONOABS 0.6 01/15/2016 0849   EOSABS 0.3 01/15/2016 0849   BASOSABS 0.0 01/15/2016 0849     Assessment/Plan:  1. 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 Likely due to underlying DM/HTN kidney disease with AKI related to decompensated diastolic CHF/cardiorenal syndrome. S/p HD x 3 with improved volume. Unclear if he will regain renal function, will continue to follow. Baseline Scr appears to be in the 3's.  1. Unfortunately remains anuric/oliguric 2. bladder scan only had 31ml yesterday 3. Continue with IHD due to lack of response form high dose IV lasix. 4. Plan for HD on Sat  2. Diastolic CHF- with anasarca. Unresponsive to IV diuresis. Improved with HD and UF. S/p HD x 3 will attempt IV lasix and follow UOP over the next 48 hours before another session of HD.  3. A fib on amiodarone s/p DC cardioversion on 01/03/16 by Dr. Shirlee LatchMcLean. 4. Complete heart block- S/p Permanent pacemaker 5. Vascular access- for right arm access and tunneled HD catheter tomorrow if INR safe. 6. Pyuria-  7. HTN- stable 8. DM per primary 9. Anemia of CKD- started on ESA 10. SHPTH- will follow Ca/Phos and check iPTH. 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 11. Disposition- unclear if he will require longterm HD. If so he will need outpatient HD arrangements and will need a tunneled HD cath for now and an AVF/AVG even if he recovers renal function given his advanced CKD at baseline.  Irena CordsJoseph A. Bradie Sangiovanni, MD [facility] [phone]

## 2016-01-24 NOTE — Progress Notes (Signed)
   Subjective: Patient was evaluated this morning on rounds. He was sitting in the hospital chair beside his bed eating breakfast. He was not on oxygen and states that his breathing is stable. He denies any nausea or vomiting, abdominal pain or chest pain.  Objective:  Vital signs in last 24 hours: Vitals:   01/24/16 0948 01/24/16 1012 01/24/16 1220 01/24/16 1405  BP:  96/75 110/76   Pulse:  61 60   Resp:   18   Temp:   97.8 F (36.6 C)   TempSrc:   Oral   SpO2: 95%  96% 98%  Weight:      Height:       Physical Exam  Constitutional: No distress.  Cardiovascular: Normal rate, regular rhythm and normal heart sounds.  Exam reveals no gallop and no friction rub.   No murmur heard. Pulmonary/Chest: Effort normal and breath sounds normal. No respiratory distress. He has no wheezes. He has no rales.  Abdominal: Soft. He exhibits no distension.  Musculoskeletal:  1+ pitting edema in lower extremities bilaterally     Assessment/Plan:  Principal Problem:   Acute on chronic diastolic CHF (congestive heart failure) (HCC) Active Problems:   IDDM (insulin dependent diabetes mellitus) (HCC)   Obesity (BMI 30-39.9)   Cardiac pacemaker in situ   Hypothyroidism   History of complete heart block   Chronic anticoagulation   OSA (obstructive sleep apnea)   Pressure injury of skin   COPD (chronic obstructive pulmonary disease) (HCC)   Acute renal failure superimposed on chronic kidney disease (HCC)   Essential hypertension   Anemia of chronic disease  Acute on chronic diastolic CHF Patient is stating well on room air. Patient continues to not have urine output despite Lasix 160mg  TID. He is scheduled for a tunnel catheter and fistula/graft inpatient for long-term dialysis tomorrow pending his INR. Currently weighs 253lb from 274lb on admission.  He had 1L of fluid removed in dialysis yesterday.  - Hemodialysis saturday  - Monitor I/Os - daily weights - CPAP QHS  Atrial fibrillation    S/P DCCV on 11/16. Appreciate cardiology's recommendations.Patient has a regular rate and rhythm. Heart rates are in the 60s.  INR is 2.59. Per Cardiology recommendation can discontinue amiodarone. - holding warfarin in anticipation of graft/fistula placement and tunnel catheter tomorrow. - discontinue amiodarone  Chronic kidney disease stage IV Appreciate nephrology's recommendations. Patient continues to not diurese despite high dose lasix.  Will continue lasix and continue to monitor.  Patient had dialysis yesterday and 1 L fluid was removed.  Condom catheter is in place. Patient has no urinary complaints.   - continue Temporary hemodialysis, next on saturday  Type 2 diabetes  Glucose 228. Lantus was increased to 30 units QHS yesterday.  Blood glucose continues to be uncontrolled.  Will increase to 32 units QHS -SSI - Lantus at 32unitsQHS  Anemia of chronic disease Hb 7.8and stable. 8.5 on admit,normal MCV. Baseline is around 9. - Continue home ferrous sulfate - CBC, transfusion threshold 7  HTN 110/76and stable - continue reducedhome metoprolol to 12.5mg  BID - continue to hold home amlodipine  HLD -continue home atorvastatin  Hypothyroidism: Last TSH 8.141 on 11/9 -Continue home Synthroid 25mcg  DVT prophylaxis: SCDs Code status:  Full Diet:  Dysphagia 3, NPO at midnight  Dispo: Anticipated discharge pending clinical improvement.   Camelia PhenesJessica Ratliff Anyela Napierkowski, DO 01/24/2016, 3:41 PM Pager: [phone]

## 2016-01-24 NOTE — Progress Notes (Signed)
Inpatient Diabetes Program Recommendations  AACE/ADA: New Consensus Statement on Inpatient Glycemic Control (2015)  Target Ranges:  Prepandial:   less than 140 mg/dL      Peak postprandial:   less than 180 mg/dL (1-2 hours)      Critically ill patients:  140 - 180 mg/dL   Lab Results  Component Value Date   GLUCAP 237 (H) 01/24/2016   HGBA1C 6.4 (H) 01/15/2016    Review of Glycemic Control Results for [name], Aloysius D (MRN [id number]) as of 01/24/2016 11:19  Ref. Range 01/22/2016 20:56 01/23/2016 05:50 01/23/2016 11:40 01/23/2016 21:20 01/24/2016 06:13  Glucose-Capillary Latest Ref Range: 65 - 99 mg/dL 409228 (H) 811266 (H) 914233 (H) 200 (H) 237 (H)   Diabetes history: DM2 Outpatient Diabetes medications: Lantus 52 units QHS, Novolog 20 units TID with meals Current orders for Inpatient glycemic control: Lantus 30 units QHS, Novolog 0-15 units TID with meals  Inpatient Diabetes Program Recommendations: Insulin - Basal: Increase in Lantus to 35 units daily Insulin - Meal Coverage:  Novolog 5 units TID with meals for meal coverage. Insulin-Correction: Please consider ordering Novolog bedtime correction scale.  Thank you, Billy FischerJudy E. Zaide Mcclenahan, RN, MSN, CDE Inpatient Glycemic Control Team Team Pager [phone] (8am-5pm) 01/24/2016 11:19 AM

## 2016-01-24 NOTE — Progress Notes (Signed)
Internal Medicine Attending:   I saw and examined the patient. I reviewed the resident's note and I agree with the resident's findings and plan as documented in the resident's note.  Patient feels well with no new complaints. SOB has improved and he is now on room air. Still with minimal urine output despite lasix 160 mg TID. Patient scheduled for tunneled HD catheter in AM as well as AV fistula/graft provided INR improves. Continue to hold coumadin today.  Cardio f/u appreciated. Will d/c amiodarone for now. No DCCV planned at this time given interrupted a/c for AV graft. He will need to f/u with Dr.Klein as an outpatient. Will resume a/c with warfarin once vascular procedure is completed.

## 2016-01-24 NOTE — Progress Notes (Signed)
Left forearm IV catheter came out, infiltration score documented, IV Team RN removed and will insert another one.

## 2016-01-24 NOTE — Progress Notes (Signed)
Placed pt on CPAP at this time. Pt tolerating well.

## 2016-01-24 NOTE — Progress Notes (Signed)
Discussed arrhythmia management with Dr. Graciela HusbandsKlein. He agrees that cardioversion at this time would be inopportune. Also agrees that long term maintenance of normal rhythm is unlikely. Will stop the amiodarone and plan outpatient follow up with Dr. Graciela HusbandsKlein. Warfarin should be restarted as soon as possible after his AV fistula surgery. Will be available for other questions if necessary during this admission.  Zachary FairMihai Oryn Casanova, MD, [facility] [phone] office [phone] pager

## 2016-01-24 NOTE — Progress Notes (Signed)
Physical Therapy Treatment Patient Details Name: [name] MRN: [id number] DOB: 09-29-1945 Today's Date: 01/24/2016    History of Present Illness Pt is a 70 y/o male admitted secondary to abdominal tightness and SOB, found to have pneumonia. PMH including but not limited to CHF, CKD, DM and pacemaker placement in 2014.    PT Comments    Pt presented sitting OOB in recliner when PT entered room. Pt's wife was present throughout session as well. Pt participated in bilateral LE strengthening exercises and functional mobility activities this session (see below). Pt making slow progress and required less assistance with sit-to-stand transfers this session as compared to previous session. Pt would continue to benefit from skilled physical therapy services at this time while admitted and after d/c to address his limitations in order to improve his overall safety and independence with functional mobility.   Follow Up Recommendations  Home health PT;Supervision/Assistance - 24 hour (pt refusing SNF)     Equipment Recommendations  None recommended by PT    Recommendations for Other Services       Precautions / Restrictions Precautions Precautions: Fall Restrictions Weight Bearing Restrictions: No    Mobility  Bed Mobility               General bed mobility comments: pt sitting OOB in recliner when PT entered room  Transfers Overall transfer level: Needs assistance Equipment used: Rolling walker (2 wheeled) Transfers: Sit to/from Stand Sit to Stand: Min assist         General transfer comment: pt performed sit-to-stand transfers x4 with min A to achieve full standing from recliner, VC'ing for bilateral hand placement  Ambulation/Gait             General Gait Details: participated in strengthening exercises this session (see below)   Stairs            Wheelchair Mobility    Modified Rankin (Stroke Patients Only)       Balance Overall balance  assessment: Needs assistance Sitting-balance support: Feet supported;No upper extremity supported Sitting balance-Leahy Scale: Fair     Standing balance support: During functional activity;Bilateral upper extremity supported Standing balance-Leahy Scale: Poor Standing balance comment: pt reliant on bilateral UEs on RW, min guard for safety with static standing                    Cognition Arousal/Alertness: Awake/alert Behavior During Therapy: WFL for tasks assessed/performed Overall Cognitive Status: Within Functional Limits for tasks assessed                      Exercises General Exercises - Lower Extremity Long Arc Quad: AROM;Strengthening;Both;10 reps;Seated Hip Flexion/Marching: AROM;Strengthening;Both;20 reps;Seated Other Exercises Other Exercises: pt performed sit-to-stand with min A x4 with sitting rest break x1 min following third rep. Pt with increased fatigue and decreased quality of eccentric muscle contraction with return to sit on final rep    General Comments        Pertinent Vitals/Pain Pain Assessment: No/denies pain    Home Living                      Prior Function            PT Goals (current goals can now be found in the care plan section) Acute Rehab PT Goals Patient Stated Goal: return home PT Goal Formulation: With patient/family Time For Goal Achievement: 02/02/16 Potential to Achieve Goals: Fair Progress towards PT goals:  Progressing toward goals    Frequency    Min 3X/week      PT Plan Current plan remains appropriate    Co-evaluation             End of Session Equipment Utilized During Treatment: Gait belt Activity Tolerance: Patient limited by fatigue Patient left: in chair;with call bell/phone within reach;with family/visitor present     Time: 1345-1403 PT Time Calculation (min) (ACUTE ONLY): 18 min  Charges:  $Therapeutic Exercise: 8-22 mins                    G CodesAlessandra [name]:      [name]  [name] 01/24/2016, 3:33 PM [name], PT, DPT [phone]

## 2016-01-25 LAB — BASIC METABOLIC PANEL
Anion gap: 16 — ABNORMAL HIGH (ref 5–15)
BUN: 36 mg/dL — AB (ref 6–20)
CO2: 22 mmol/L (ref 22–32)
Calcium: 9 mg/dL (ref 8.9–10.3)
Chloride: 95 mmol/L — ABNORMAL LOW (ref 101–111)
Creatinine, Ser: 5.32 mg/dL — ABNORMAL HIGH (ref 0.61–1.24)
GFR calc Af Amer: 11 mL/min — ABNORMAL LOW (ref >60)
GFR, EST NON AFRICAN AMERICAN: 10 mL/min — AB (ref >60)
Glucose, Bld: 268 mg/dL — ABNORMAL HIGH (ref 65–99)
POTASSIUM: 3.8 mmol/L (ref 3.5–5.1)
SODIUM: 133 mmol/L — AB (ref 135–145)

## 2016-01-25 LAB — RENAL FUNCTION PANEL
ALBUMIN: 2.4 g/dL — AB (ref 3.5–5.0)
ANION GAP: 15 (ref 5–15)
BUN: 35 mg/dL — ABNORMAL HIGH (ref 6–20)
CALCIUM: 9 mg/dL (ref 8.9–10.3)
CO2: 23 mmol/L (ref 22–32)
CREATININE: 5.34 mg/dL — AB (ref 0.61–1.24)
Chloride: 96 mmol/L — ABNORMAL LOW (ref 101–111)
GFR calc Af Amer: 11 mL/min — ABNORMAL LOW (ref >60)
GFR calc non Af Amer: 10 mL/min — ABNORMAL LOW (ref >60)
GLUCOSE: 270 mg/dL — AB (ref 65–99)
PHOSPHORUS: 4.8 mg/dL — AB (ref 2.5–4.6)
Potassium: 3.8 mmol/L (ref 3.5–5.1)
SODIUM: 134 mmol/L — AB (ref 135–145)

## 2016-01-25 LAB — GLUCOSE, CAPILLARY
GLUCOSE-CAPILLARY: 294 mg/dL — AB (ref 65–99)
GLUCOSE-CAPILLARY: 263 mg/dL — AB (ref 65–99)
Glucose-Capillary: 218 mg/dL — ABNORMAL HIGH (ref 65–99)
Glucose-Capillary: 266 mg/dL — ABNORMAL HIGH (ref 65–99)

## 2016-01-25 LAB — CBC
HEMATOCRIT: 26.6 % — AB (ref 39.0–52.0)
Hemoglobin: 8 g/dL — ABNORMAL LOW (ref 13.0–17.0)
MCH: 27.5 pg (ref 26.0–34.0)
MCHC: 30.1 g/dL (ref 30.0–36.0)
MCV: 91.4 fL (ref 78.0–100.0)
PLATELETS: 219 10*3/uL (ref 150–400)
RBC: 2.91 MIL/uL — ABNORMAL LOW (ref 4.22–5.81)
RDW: 20 % — AB (ref 11.5–15.5)
WBC: 6.9 10*3/uL (ref 4.0–10.5)

## 2016-01-25 LAB — SURGICAL PCR SCREEN
MRSA, PCR: POSITIVE — AB
STAPHYLOCOCCUS AUREUS: POSITIVE — AB

## 2016-01-25 LAB — PROTIME-INR
INR: 2.28
Prothrombin Time: 25.5 seconds — ABNORMAL HIGH (ref 11.4–15.2)

## 2016-01-25 MED ORDER — INSULIN ASPART 100 UNIT/ML ~~LOC~~ SOLN
5.0000 [IU] | Freq: Three times a day (TID) | SUBCUTANEOUS | Status: DC
  Administered 2016-01-25 – 2016-01-29 (×7): 5 [IU] via SUBCUTANEOUS

## 2016-01-25 MED ORDER — INSULIN GLARGINE 100 UNIT/ML ~~LOC~~ SOLN
35.0000 [IU] | Freq: Every day | SUBCUTANEOUS | Status: DC
  Administered 2016-01-25 – 2016-01-30 (×5): 35 [IU] via SUBCUTANEOUS
  Filled 2016-01-25 (×8): qty 0.35

## 2016-01-25 MED ORDER — CHLORHEXIDINE GLUCONATE CLOTH 2 % EX PADS
6.0000 | MEDICATED_PAD | Freq: Every day | CUTANEOUS | Status: AC
Start: 2016-01-26 — End: 2016-01-26
  Administered 2016-01-26 (×2): 6 via TOPICAL

## 2016-01-25 MED ORDER — INSULIN ASPART 100 UNIT/ML ~~LOC~~ SOLN
0.0000 [IU] | Freq: Every day | SUBCUTANEOUS | Status: DC
  Administered 2016-01-25: 3 [IU] via SUBCUTANEOUS

## 2016-01-25 MED ORDER — INSULIN ASPART 100 UNIT/ML ~~LOC~~ SOLN: 5.0000 [IU] | Freq: Three times a day (TID) | SUBCUTANEOUS | Status: DC

## 2016-01-25 MED ORDER — INSULIN ASPART 100 UNIT/ML ~~LOC~~ SOLN
0.0000 [IU] | Freq: Three times a day (TID) | SUBCUTANEOUS | Status: DC
  Administered 2016-01-25: 8 [IU] via SUBCUTANEOUS
  Administered 2016-01-26 – 2016-01-27 (×3): 3 [IU] via SUBCUTANEOUS
  Administered 2016-01-27: 5 [IU] via SUBCUTANEOUS
  Administered 2016-01-27: 3 [IU] via SUBCUTANEOUS
  Administered 2016-01-29: 5 [IU] via SUBCUTANEOUS
  Administered 2016-01-29: 3 [IU] via SUBCUTANEOUS
  Administered 2016-01-29: 2 [IU] via SUBCUTANEOUS
  Administered 2016-01-30: 3 [IU] via SUBCUTANEOUS
  Administered 2016-01-30 (×2): 5 [IU] via SUBCUTANEOUS
  Administered 2016-01-31: 0.3 [IU] via SUBCUTANEOUS
  Administered 2016-01-31: 2 [IU] via SUBCUTANEOUS

## 2016-01-25 NOTE — Progress Notes (Signed)
Patient ID: Zadie CleverlyMichael D Lasseigne, male   DOB: January 15, 1946, 70 y.o.   MRN: [id number] S:vascular access surgery held until Monday due to [place]^INR O:BP 125/60 (BP Location: Left Arm)   Pulse 62   Temp 98.6 F (37 C) (Oral)   Resp 18   Ht 5\' 11"  (1.803 m)   Wt 111.8 kg (246 lb 7.6 oz)   SpO2 94%   BMI 34.38 kg/m   Intake/Output Summary (Last 24 hours) at 01/25/16 0835 Last data filed at 01/24/16 1529  Gross per 24 hour  Intake              360 ml  Output                0 ml  Net              360 ml   Intake/Output: I/O last 3 completed shifts: In: 760 [P.O.:760] Out: -   Intake/Output this shift:  No intake/output data recorded. Weight change: -1.4 kg (-3 lb 1.4 oz) Gen:WD obese WM in NAD CVS:no rub Resp:occ rhonchi WUJ:WJXBJAbd:obese Ext:no edema   Recent Labs Lab 01/18/16 1501  01/20/16 0450 01/21/16 0711 01/21/16 1521 01/22/16 0453 01/23/16 0417 01/24/16 0416 01/25/16 0349  NA  --   < > 133* 131* 134* 131* 131* 134* 133*  134*  K  --   < > 4.2 4.4 3.6 3.6 3.6 3.7 3.8  3.8  CL  --   < > 97* 96* 96* 92* 94* 96* 95*  96*  CO2  --   < > 24 19* 25 28 24 25 22  23   GLUCOSE  --   < > 265* 312* 206* 284* 275* 228* 268*  270*  BUN  --   < > 36* 51* 19 30* 41* 25* 36*  35*  CREATININE  --   < > 3.64* 5.34* 3.38* 4.41* 5.80* 4.02* 5.32*  5.34*  ALBUMIN  --   --   --   --  2.5* 2.3* 2.2* 2.3* 2.4*  CALCIUM  --   < > 8.8* 9.0 8.6* 8.9 8.8* 8.7* 9.0  9.0  PHOS  --   --   --   --  3.3 4.8* 5.2* 3.7 4.8*  ALT 19  --   --   --   --   --   --   --   --   < > = values in this interval not displayed. Liver Function Tests:  Recent Labs Lab 01/18/16 1501  01/23/16 0417 01/24/16 0416 01/25/16 0349  ALT 19  --   --   --   --   ALBUMIN  --   < > 2.2* 2.3* 2.4*  < > = values in this interval not displayed. No results for input(s): LIPASE, AMYLASE in the last 168 hours. No results for input(s): AMMONIA in the last 168 hours. CBC:  Recent Labs Lab 01/20/16 0450 01/21/16 0711  01/22/16 0446 01/23/16 0417 01/25/16 0349  WBC 9.4 9.9 9.2 9.6 6.9  HGB 7.6* 8.1* 7.7* 7.8* 8.0*  HCT 25.4* 26.4* 25.4* 26.2* 26.6*  MCV 92.0 91.3 92.4 92.6 91.4  PLT 203 193 205 206 219   Cardiac Enzymes: No results for input(s): CKTOTAL, CKMB, CKMBINDEX, TROPONINI in the last 168 hours. CBG:  Recent Labs Lab 01/24/16 0613 01/24/16 1205 01/24/16 1634 01/24/16 2126 01/25/16 0605  GLUCAP 237* 237* 243* 264* 294*    Iron Studies: No results for input(s): IRON, TIBC, TRANSFERRIN, FERRITIN in the  last 72 hours. Studies/Results: No results found. Marland Kitchen. atorvastatin  40 mg Oral QHS  .  ceFAZolin (ANCEF) IV  1 g Intravenous To OR  . Chlorhexidine Gluconate Cloth  6 each Topical Q0600  . Chlorhexidine Gluconate Cloth  6 each Topical Once  . cholecalciferol  2,000 Units Oral Daily  . darbepoetin (ARANESP) injection - DIALYSIS  100 mcg Intravenous Q Sat-HD  . ferrous sulfate  325 mg Oral Q breakfast  . folic acid  1 mg Oral Daily  . furosemide  160 mg Intravenous TID  . insulin aspart  0-15 Units Subcutaneous TID WC  . insulin glargine  32 Units Subcutaneous QHS  . ipratropium-albuterol  3 mL Nebulization TID  . levothyroxine  37.5 mcg Oral QAC breakfast  . mouth rinse  15 mL Mouth Rinse BID  . metoprolol  12.5 mg Oral BID  . mupirocin ointment  1 application Nasal BID  . pantoprazole  40 mg Oral Daily  . senna-docusate  2 tablet Oral QHS  . sertraline  100 mg Oral Daily  . sodium chloride flush  3 mL Intravenous Q12H  . thiamine  100 mg Oral Daily    BMET    Component Value Date/Time   NA 134 (L) 01/25/2016 0349   NA 133 (L) 01/25/2016 0349   K 3.8 01/25/2016 0349   K 3.8 01/25/2016 0349   CL 96 (L) 01/25/2016 0349   CL 95 (L) 01/25/2016 0349   CO2 23 01/25/2016 0349   CO2 22 01/25/2016 0349   GLUCOSE 270 (H) 01/25/2016 0349   GLUCOSE 268 (H) 01/25/2016 0349   BUN 35 (H) 01/25/2016 0349   BUN 36 (H) 01/25/2016 0349   CREATININE 5.34 (H) 01/25/2016 0349    CREATININE 5.32 (H) 01/25/2016 0349   CALCIUM 9.0 01/25/2016 0349   CALCIUM 9.0 01/25/2016 0349   GFRNONAA 10 (L) 01/25/2016 0349   GFRNONAA 10 (L) 01/25/2016 0349   GFRAA 11 (L) 01/25/2016 0349   GFRAA 11 (L) 01/25/2016 0349   CBC    Component Value Date/Time   WBC 6.9 01/25/2016 0349   RBC 2.91 (L) 01/25/2016 0349   HGB 8.0 (L) 01/25/2016 0349   HCT 26.6 (L) 01/25/2016 0349   PLT 219 01/25/2016 0349   MCV 91.4 01/25/2016 0349   MCH 27.5 01/25/2016 0349   MCHC 30.1 01/25/2016 0349   RDW 20.0 (H) 01/25/2016 0349   LYMPHSABS 1.5 01/15/2016 0849   MONOABS 0.6 01/15/2016 0849   EOSABS 0.3 01/15/2016 0849   BASOSABS 0.0 01/15/2016 0849     Assessment/Plan:  1. 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 Likely due to underlying DM/HTN kidney disease with AKI related to decompensated diastolic CHF/cardiorenal syndrome. S/p HD x 3with improved volume. Unclear if he will regain renal function, will continue to follow. Baseline Scr appears to be in the 3's.  1. Unfortunately remains anuric/oliguric 2. bladder scan only had 31ml yesterday 3. Continue with IHD due to lack of response form high dose IV lasix. 4. Plan for HD on Sat  2. Diastolic CHF- with anasarca. Unresponsive to IV diuresis. Improved with HD and UF. S/p HD x 3 will attempt IV lasix and follow UOP over the next 48 hours before another session of HD.  3. A fib on amiodarone s/p DC cardioversion on 01/03/16 by Dr. Shirlee LatchMcLean. 4. Complete heart block- S/p Permanent pacemaker 5. Vascular access- for right arm access and tunneled HD catheter 01/28/16 if INR safe. 6. Pyuria-  7. HTN- stable 8. DM per  primary 9. Anemia of CKD- started on ESA 10. SHPTH- will follow Ca/Phos and check iPTH. 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 11. Disposition- unclear if he will require longterm HD. If so he will need outpatient HD arrangements and will need a tunneled HD cath for now andan AVF/AVG even if he recovers  renal function given his advanced CKD at baseline.  Irena Cords, MD [employer] [phone]

## 2016-01-25 NOTE — Progress Notes (Signed)
Subjective: Patient was evaluated this morning on rounds. He was sitting up in bed eating breakfast. He states that his breathing is doing well and has not required supplemental oxygen. He states he uses CPAP at night for sleep apnea.  He denies any nausea or vomiting, chest pain or difficulty breathing.  Objective:  Vital signs in last 24 hours: Vitals:   01/24/16 2037 01/24/16 2110 01/25/16 0500 01/25/16 0735  BP: (!) 101/48  125/60   Pulse: 64  62   Resp: 18  18   Temp: 97.5 F (36.4 C)  98.6 F (37 C)   TempSrc: Oral  Oral   SpO2: 98% 98% 98% 94%  Weight:   246 lb 7.6 oz (111.8 kg)   Height:       Physical Exam  Constitutional: No distress.  Cardiovascular: Normal rate and regular rhythm.  Exam reveals no gallop and no friction rub.   No murmur heard. Pulmonary/Chest: Effort normal. No respiratory distress. He has no wheezes.  Mild bibasilar crackles  Abdominal: Soft. He exhibits no distension. There is no tenderness.  Musculoskeletal: He exhibits no edema.  Skin: Skin is warm and dry.    Assessment/Plan:  Principal Problem:   Acute on chronic diastolic CHF (congestive heart failure) (HCC) Active Problems:   IDDM (insulin dependent diabetes mellitus) (HCC)   Obesity (BMI 30-39.9)   Cardiac pacemaker in situ   Hypothyroidism   History of complete heart block   Chronic anticoagulation   OSA (obstructive sleep apnea)   Pressure injury of skin   COPD (chronic obstructive pulmonary disease) (HCC)   Acute renal failure superimposed on chronic kidney disease (HCC)   Essential hypertension   Anemia of chronic disease  Acute on chronic diastolic CHF Patient is stating well on room air. Patient continues to not have urine output despiteLasix 160mg  TID. He is scheduled for a tunnel catheter and fistula/graft inpatient for long-term dialysis.  INR is 2.28 and vasular surgery has rescheduled his fistula and tunnel catheter surgery for Monday. Currently weighs 246lb from  274lb on admission.    - Hemodialysis saturday  - Continue Lasix - Monitor I/Os - daily weights - CPAP QHS  Atrial fibrillation  S/P DCCV on 11/16. Appreciate cardiology's recommendations.Patient has a regular rate and rhythm. Heart rates are in the 60s. INR is 2.28.  - holding warfarin in anticipation of graft/fistula placement and tunnel catheter tomorrow.  Chronic kidney disease stage IV Appreciate nephrology's recommendations. Patient continues to not diurese despite high dose lasix.  Will continue lasix and continue to monitor.  Condom catheter is in place. Patient has no urinary complaints.  - continue Temporary hemodialysis, next on saturday  Type 2 diabetes  Glucose 268. Lantus was increased to 32 units QHS.  Blood glucose continues to be uncontrolled.  Patient has dysphagia 3 diet.  This morning breakfast included eggs, pancakes with syrup and coffee.  Patient will need carb modified diet.  His meals may be contributing to his uncontrolled blood sugars. -SSI - Lantus at 32unitsQHS  Anemia of chronic disease Hb 8.0and stable. 8.5 on admit,normal MCV. Baseline is around 9. - Continue home ferrous sulfate - CBC, transfusion threshold 7  HTN 125/60and stable - continue reducedhome metoprolol to 12.5mg  BID - continue to hold home amlodipine  HLD -continue home atorvastatin  Hypothyroidism: Last TSH 8.141 on 11/9 -Continue home Synthroid <[facility]  Dispo: Anticipated discharge in approximately 5 days.   Fedra Lanter Ratliff Wynn Alldredge, DO 01/25/2016, 1:04 PM Pager:  [phone]  

## 2016-01-25 NOTE — Progress Notes (Signed)
Internal Medicine Attending:   I saw and examined the patient. I reviewed the resident's note and I agree with the resident's findings and plan as documented in the resident's note.  Patient feels well today with no new complaints. He states he has been breathing well and has not required oxygen. He used CPAP last night for his sleep apnea. He was scheduled for a right AV graft/fistula placement as well as a tunneled HD catheter placement today but his INR remained above 2. Vascular surgery follow-up appreciated. Patient now scheduled for procedure on Monday.  Patient continues to have minimal to no urine output despite the use of high-dose IV Lasix. It is likely that he will need long-term hemodialysis. Nephrology follow-up appreciated. We will continue with hemodialysis per nephrology for now.  Patient noted have persistently elevated blood sugars to the 200s. We will increase his Lantus to 35 units and add meal coverage with NovoLog 5 mg 3 times a day before meals.  Cardiology follow-up appreciated. Patient with atrial fibrillation status post DCCV on 11/16. He remains in A. fib despite use of amiodarone. Per cardiology recommendations we will DC amiodarone for now. We will resume anticoagulation for A. fib once his vascular procedure has been completed. He will need outpatient follow-up with cardiology and possible cardioversion once he completes 3 weeks of uninterrupted anticoagulation.

## 2016-01-25 NOTE — Progress Notes (Signed)
Inpatient Diabetes Program Recommendations  AACE/ADA: New Consensus Statement on Inpatient Glycemic Control (2015)  Target Ranges:  Prepandial:   less than 140 mg/dL      Peak postprandial:   less than 180 mg/dL (1-2 hours)      Critically ill patients:  140 - 180 mg/dL   Lab Results  Component Value Date   GLUCAP 294 (H) 01/25/2016   HGBA1C 6.4 (H) 01/15/2016    Review of Glycemic Control Results for [name], [name] (MRN [id number]) as of 01/25/2016 10:06  Ref. Range 01/24/2016 06:13 01/24/2016 12:05 01/24/2016 16:34 01/24/2016 21:26 01/25/2016 06:05  Glucose-Capillary Latest Ref Range: 65 - 99 mg/dL 409237 (H) 811237 (H) 914243 (H) 264 (H) 294 (H)   Diabetes history:DM2 Outpatient Diabetes medications: Lantus 52 units QHS, Novolog 20 units TID with meals Current orders for Inpatient glycemic control: Lantus 32 units QHS, Novolog 0-15 units TID with meals  Inpatient Diabetes Program Recommendations: Insulin - Basal: Increase in Lantus to 35 units daily Insulin - Meal Coverage: Novolog 5 units TID with meals for meal coverage. Insulin-Correction:Please consider ordering Novolog bedtime correction scale.  Thank you, Billy FischerJudy E. Ivone Licht, RN, MSN, CDE Inpatient Glycemic Control Team Team Pager [phone] (8am-5pm) 01/25/2016 10:06 AM

## 2016-01-25 NOTE — Progress Notes (Signed)
VASCULAR SURGERY  INR = 2.28  R AVF/AVG and TDC rescheduled for Monday.  Discussed with patient.  Waverly Ferrarihristopher Colleen Kotlarz, MD, FACS Beeper [phone] Office: [phone]

## 2016-01-25 NOTE — Progress Notes (Signed)
Lab called and MRSA nasal swab resulted positive for MRSA and Staph aureus. Pt is already on contact precautions and charge nurse informed; charge states no changes need to be made.

## 2016-01-26 LAB — RENAL FUNCTION PANEL
ALBUMIN: 2.4 g/dL — AB (ref 3.5–5.0)
ANION GAP: 14 (ref 5–15)
BUN: 44 mg/dL — ABNORMAL HIGH (ref 6–20)
CO2: 25 mmol/L (ref 22–32)
Calcium: 9.2 mg/dL (ref 8.9–10.3)
Chloride: 98 mmol/L — ABNORMAL LOW (ref 101–111)
Creatinine, Ser: 5.69 mg/dL — ABNORMAL HIGH (ref 0.61–1.24)
GFR, EST AFRICAN AMERICAN: 11 mL/min — AB (ref >60)
GFR, EST NON AFRICAN AMERICAN: 9 mL/min — AB (ref >60)
Glucose, Bld: 216 mg/dL — ABNORMAL HIGH (ref 65–99)
PHOSPHORUS: 5.5 mg/dL — AB (ref 2.5–4.6)
POTASSIUM: 3.8 mmol/L (ref 3.5–5.1)
Sodium: 137 mmol/L (ref 135–145)

## 2016-01-26 LAB — GLUCOSE, CAPILLARY
GLUCOSE-CAPILLARY: 170 mg/dL — AB (ref 65–99)
GLUCOSE-CAPILLARY: 145 mg/dL — AB (ref 65–99)
Glucose-Capillary: 183 mg/dL — ABNORMAL HIGH (ref 65–99)
Glucose-Capillary: 108 mg/dL — ABNORMAL HIGH (ref 65–99)

## 2016-01-26 LAB — CBC
HEMATOCRIT: 26.8 % — AB (ref 39.0–52.0)
HEMOGLOBIN: 8.1 g/dL — AB (ref 13.0–17.0)
MCH: 27.7 pg (ref 26.0–34.0)
MCHC: 30.2 g/dL (ref 30.0–36.0)
MCV: 91.8 fL (ref 78.0–100.0)
Platelets: 221 10*3/uL (ref 150–400)
RBC: 2.92 MIL/uL — ABNORMAL LOW (ref 4.22–5.81)
RDW: 19.7 % — ABNORMAL HIGH (ref 11.5–15.5)
WBC: 6.4 10*3/uL (ref 4.0–10.5)

## 2016-01-26 LAB — PROTIME-INR
INR: 2.06
Prothrombin Time: 23.6 seconds — ABNORMAL HIGH (ref 11.4–15.2)

## 2016-01-26 MED ORDER — HEPARIN SODIUM (PORCINE) 1000 UNIT/ML DIALYSIS: 20.0000 [IU]/kg | INTRAMUSCULAR | Status: DC | PRN

## 2016-01-26 MED ORDER — DARBEPOETIN ALFA 100 MCG/0.5ML IJ SOSY
PREFILLED_SYRINGE | INTRAMUSCULAR | Status: AC
  Filled 2016-01-26: qty 0.5

## 2016-01-26 NOTE — Progress Notes (Signed)
Internal Medicine Attending:   I saw and examined the patient. I reviewed the resident's note and I agree with the resident's findings and plan as documented in the resident's note.  Patient feels well today with no new complaints. He continues to remain oliguric despite a high dose of IV Lasix. He will likely need long-term hemodialysis. He is scheduled for a right AV graft/fistula as well as a tunneled HD catheter placement on Monday provided his INR improves. We will continue to hold Coumadin for now. Continue with hemodialysis per nephrology.  Blood sugars have improved today. We will continue with current insulin regimen.

## 2016-01-26 NOTE — Progress Notes (Signed)
   Subjective: Patient was evaluated this morning on rounds. No complaints this morning. Denies any shortness of breath. Has not required any further supplemental O2. Did say he used CPAP overnight. Feeling better, believes edema is improving.  Objective:  Vital signs in last 24 hours: Vitals:   01/26/16 0830 01/26/16 0900 01/26/16 0930 01/26/16 1000  BP: (!) 113/49 (!) 120/54 (!) 115/48 (!) 119/57  Pulse: 60 60 61 60  Resp: 16 19 17 19   Temp:      TempSrc:      SpO2:      Weight:      Height:       Physical Exam  Constitutional: No distress.  Cardiovascular: Normal rate and regular rhythm.  Exam reveals no gallop and no friction rub.   No murmur heard. Pulmonary/Chest: Effort normal. No respiratory distress. He has no wheezes.  Mild bibasilar crackles  Abdominal: Soft. He exhibits no distension. There is no tenderness.  Musculoskeletal: He exhibits no edema.  Skin: Skin is warm and dry.    Assessment/Plan:  Acute on chronic diastolic CHF Patient is stating well on room air. Patient continues to not have urine output despiteLasix 160mg  TID. He is scheduled for a tunnel catheter and fistula/graft inpatient for long-term dialysis on Monday.  INR is 2.06 today. No IR goal left by vascular but expect his to be <1.7 on Monday. - Hemodialysis today per nephro  - Continue Lasix - Monitor I/Os - daily weights  Atrial fibrillation  S/P DCCV on 11/16. Appreciate cardiology's recommendations.Patient has a regular rate and rhythm. Heart rates are in the 60s. INR is 2.06. CHAD-Vasc score 4.  - holding warfarin in anticipation of graft/fistula placement and tunnel catheter 12/11.  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 Patient continues to not diurese despite high dose lasix.  Will continue lasix and continue to monitor.  Condom catheter is in place. Patient has no urinary complaints.  - HD session today  Type 2 diabetes  Glucose  216 this am. Currently on Lantus 35 units qhs, Novolog 5 units qac and SSI-m. CBGs remained in the 250 range yesterday. He is on dysphagia 3 diet which appears to consist of very sugary foods. Will attempt to arrange for better DM diet for him today. Suspect this is why his sugars have been difficult to control.  -Continue Lantus 35 qhs, SSI-m, Novolog 5 units qac  Anemia of chronic disease Hb 8.1and stable. 8.5 on admit,normal MCV. Baseline is around 9. - Continue home ferrous sulfate  HTN 121/47and stable - continue reducedhome metoprolol to 12.5mg  BID - continue to hold home amlodipine  HLD -continue home atorvastatin  Hypothyroidism: Last TSH 8.141 on 11/9 -Continue Synthroid 25mcg  OSA -CPAP QHS  Dispo: Anticipated discharge in 4-5 days.   Valentino NoseNathan Dewan Emond, MD 01/26/2016, 10:22 AM Pager: [phone]

## 2016-01-26 NOTE — Procedures (Signed)
I was present at this dialysis session. I have reviewed the session itself and made appropriate changes.   Filed Weights   01/24/16 0505 01/25/16 0500 01/26/16 0433  Weight: 115.2 kg (253 lb 15.5 oz) 111.8 kg (246 lb 7.6 oz) 115.3 kg (254 lb 1.6 oz)     Recent Labs Lab 01/26/16 0509  NA 137  K 3.8  CL 98*  CO2 25  GLUCOSE 216*  BUN 44*  CREATININE 5.69*  CALCIUM 9.2  PHOS 5.5*     Recent Labs Lab 01/23/16 0417 01/25/16 0349 01/26/16 0509  WBC 9.6 6.9 6.4  HGB 7.8* 8.0* 8.1*  HCT 26.2* 26.6* 26.8*  MCV 92.6 91.4 91.8  PLT 206 219 221    Scheduled Meds: . atorvastatin  40 mg Oral QHS  . Chlorhexidine Gluconate Cloth  6 each Topical Q0600  . cholecalciferol  2,000 Units Oral Daily  . darbepoetin (ARANESP) injection - DIALYSIS  100 mcg Intravenous Q Sat-HD  . ferrous sulfate  325 mg Oral Q breakfast  . folic acid  1 mg Oral Daily  . furosemide  160 mg Intravenous TID  . insulin aspart  0-15 Units Subcutaneous TID WC  . insulin aspart  0-5 Units Subcutaneous QHS  . insulin aspart  5 Units Subcutaneous TID WC  . insulin glargine  35 Units Subcutaneous QHS  . ipratropium-albuterol  3 mL Nebulization TID  . levothyroxine  37.5 mcg Oral QAC breakfast  . mouth rinse  15 mL Mouth Rinse BID  . metoprolol  12.5 mg Oral BID  . mupirocin ointment  1 application Nasal BID  . pantoprazole  40 mg Oral Daily  . senna-docusate  2 tablet Oral QHS  . sertraline  100 mg Oral Daily  . sodium chloride flush  3 mL Intravenous Q12H  . thiamine  100 mg Oral Daily   Continuous Infusions: PRN Meds:.sodium chloride, acetaminophen, ondansetron (ZOFRAN) IV, sodium chloride flush, THROMBI-PAD    Assessment:  Tolerating dialysis well.  For AVF/AVG on 01/28/16.  Still working with PT/OT to make him strong enough to participate with outpatient HD.  He will need to be able to transfer from wheelchair to recliner for HD.  Will plan for next HD on Tuesday 01/29/16 and have him sit in recliner  for the treatment.  Still remains anuric/oliguric and dialysis dependent.  Irena CordsJoseph A Lochlan Grygiel,  MD 01/26/2016, 8:46 AM

## 2016-01-27 LAB — GLUCOSE, CAPILLARY
GLUCOSE-CAPILLARY: 155 mg/dL — AB (ref 65–99)
GLUCOSE-CAPILLARY: 167 mg/dL — AB (ref 65–99)
GLUCOSE-CAPILLARY: 218 mg/dL — AB (ref 65–99)
Glucose-Capillary: 223 mg/dL — ABNORMAL HIGH (ref 65–99)

## 2016-01-27 LAB — RENAL FUNCTION PANEL
ALBUMIN: 2.5 g/dL — AB (ref 3.5–5.0)
ANION GAP: 12 (ref 5–15)
BUN: 19 mg/dL (ref 6–20)
CHLORIDE: 98 mmol/L — AB (ref 101–111)
CO2: 24 mmol/L (ref 22–32)
Calcium: 8.6 mg/dL — ABNORMAL LOW (ref 8.9–10.3)
Creatinine, Ser: 3.29 mg/dL — ABNORMAL HIGH (ref 0.61–1.24)
GFR calc Af Amer: 20 mL/min — ABNORMAL LOW (ref >60)
GFR, EST NON AFRICAN AMERICAN: 18 mL/min — AB (ref >60)
Glucose, Bld: 141 mg/dL — ABNORMAL HIGH (ref 65–99)
PHOSPHORUS: 3.2 mg/dL (ref 2.5–4.6)
POTASSIUM: 3.8 mmol/L (ref 3.5–5.1)
Sodium: 134 mmol/L — ABNORMAL LOW (ref 135–145)

## 2016-01-27 LAB — PROTIME-INR
INR: 1.77
PROTHROMBIN TIME: 20.8 s — AB (ref 11.4–15.2)

## 2016-01-27 MED ORDER — POLYETHYLENE GLYCOL 3350 17 G PO PACK
17.0000 g | PACK | Freq: Every day | ORAL | Status: DC
  Administered 2016-01-27 – 2016-02-01 (×4): 17 g via ORAL
  Filled 2016-01-27 (×5): qty 1

## 2016-01-27 NOTE — Progress Notes (Signed)
Plan for HD access tomorrow if INR ok. NPO after midnight   [name]

## 2016-01-27 NOTE — Progress Notes (Signed)
   Subjective:  Patient was evaluated this morning. He was sitting up comfortably in bed eating breakfast. He states that his breathing is doing well. He has no further complaints this morning.    Objective:  Vital signs in last 24 hours: Vitals:   01/26/16 1218 01/26/16 2018 01/27/16 0608 01/27/16 0732  BP: (!) 128/59 (!) 102/54 (!) 100/45   Pulse: 63 61 60   Resp: 18 20 20    Temp: 98 F (36.7 C) 97.7 F (36.5 C) 98 F (36.7 C)   TempSrc: Oral Axillary Axillary   SpO2: 98% 99% 98% 96%  Weight: 243 lb 13.3 oz (110.6 kg)  245 lb 6.4 oz (111.3 kg)   Height:       Physical Exam  Constitutional: No distress.  Cardiovascular: Normal rate and regular rhythm.  Exam reveals no gallop and no friction rub.   No murmur heard. Pulmonary/Chest: Effort normal. No respiratory distress.  Mild Bibasilar crackles  Abdominal: Soft. He exhibits no distension. There is no tenderness.  Musculoskeletal: He exhibits no edema.  Skin: Skin is warm and dry.    Assessment/Plan:  Principal Problem:   Acute on chronic diastolic CHF (congestive heart failure) (HCC) Active Problems:   IDDM (insulin dependent diabetes mellitus) (HCC)   Obesity (BMI 30-39.9)   Cardiac pacemaker in situ   Hypothyroidism   History of complete heart block   Chronic anticoagulation   OSA (obstructive sleep apnea)   Pressure injury of skin   COPD (chronic obstructive pulmonary disease) (HCC)   Acute renal failure superimposed on chronic kidney disease (HCC)   Essential hypertension   Anemia of chronic disease  Acute on chronic diastolic CHF Patient is stating well on room air. Patient continues to not have urine output despiteLasix 160mg  TID. He is scheduled for atunnel catheter and fistula/graftinpatient for long-term dialysis on Monday.  INR is 1.77 today. No IR goal left by vascular but expect his to be <1.7 on Monday. - next Hemodialysison 12/12 - Continue Lasix - Monitor I/Os - daily weights  Atrial  fibrillation  S/P DCCV on 11/16.Patient has a regular rate and rhythm. Heart rates are in the 60s. INR is 1.77. CHAD-Vasc score 4.  - holding warfarin in anticipation of graft/fistula placement and tunnel catheter 12/11.  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 Patient had hemodialysis yesterday and had 2 L removed. Patient continues to not diurese despite high dose lasix. Will continue lasix and continue to monitor. Condom catheter is in place. Patient has no urinary complaints. Scheduled for tunnel catheter and fistula/graftinpatient tomorrow. - next HD on 12/12  Type 2 diabetes  Glucose 141 this am. Currently on Lantus 35 units qhs, Novolog 5 units qac and SSI-m.  -Continue Lantus 35 qhs, SSI-m, Novolog 5 units qac  Anemia of chronic disease Hb 8.1and stable. 8.5 on admit,normal MCV. Baseline is around 9. - Continue home ferrous sulfate  HTN 100/45and stable, may consider fluid bolus, keeping in mind his heart failure, if blood pressures remain low. - continue reducedhome metoprolol to 12.5mg  BID - continue to hold home amlodipine  HLD -continue home atorvastatin  Hypothyroidism: Last TSH 8.141 on 11/9 -Continue Synthroid 25mcg  OSA -CPAP QHS  Dispo: Anticipated discharge pending clinical improvement.   Camelia PhenesJessica Ratliff Kriston Pasquarello, DO 01/27/2016, 7:53 AM Pager: [phone]

## 2016-01-27 NOTE — Progress Notes (Signed)
Patient ID: [name], male   DOB: 05/20/1945, 70 y.o.   MRN: [id number] S:no complaints O:BP (!) 100/45 (BP Location: Left Arm)   Pulse 60   Temp 98 F (36.7 C) (Axillary)   Resp 20   Ht 5\' 11"  (1.803 m)   Wt 111.3 kg (245 lb 6.4 oz) Comment: bed  SpO2 96%   BMI 34.23 kg/m   Intake/Output Summary (Last 24 hours) at 01/27/16 0855 Last data filed at 01/27/16 [id number]  Gross per 24 hour  Intake              363 ml  Output             2155 ml  Net            -1792 ml   Intake/Output: I/O last 3 completed shifts: In: 483 [P.O.:480; I.V.:3] Out: 2155 [Urine:100; Other:2055]  Intake/Output this shift:  No intake/output data recorded. Weight change: -2.659 kg (-5 lb 13.8 oz) Gen:WD obese WM in NAD CVS:no rub Resp:cta HQI:ONGEXAbd:obese, +BS Ext:no edema   Recent Labs Lab 01/21/16 1521 01/22/16 0453 01/23/16 0417 01/24/16 0416 01/25/16 0349 01/26/16 0509 01/27/16 0153  NA 134* 131* 131* 134* 133*  134* 137 134*  K 3.6 3.6 3.6 3.7 3.8  3.8 3.8 3.8  CL 96* 92* 94* 96* 95*  96* 98* 98*  CO2 25 28 24 25 22  23 25 24   GLUCOSE 206* 284* 275* 228* 268*  270* 216* 141*  BUN 19 30* 41* 25* 36*  35* 44* 19  CREATININE 3.38* 4.41* 5.80* 4.02* 5.32*  5.34* 5.69* 3.29*  ALBUMIN 2.5* 2.3* 2.2* 2.3* 2.4* 2.4* 2.5*  CALCIUM 8.6* 8.9 8.8* 8.7* 9.0  9.0 9.2 8.6*  PHOS 3.3 4.8* 5.2* 3.7 4.8* 5.5* 3.2   Liver Function Tests:  Recent Labs Lab 01/25/16 0349 01/26/16 0509 01/27/16 0153  ALBUMIN 2.4* 2.4* 2.5*   No results for input(s): LIPASE, AMYLASE in the last 168 hours. No results for input(s): AMMONIA in the last 168 hours. CBC:  Recent Labs Lab 01/21/16 0711 01/22/16 0446 01/23/16 0417 01/25/16 0349 01/26/16 0509  WBC 9.9 9.2 9.6 6.9 6.4  HGB 8.1* 7.7* 7.8* 8.0* 8.1*  HCT 26.4* 25.4* 26.2* 26.6* 26.8*  MCV 91.3 92.4 92.6 91.4 91.8  PLT 193 205 206 219 221   Cardiac Enzymes: No results for input(s): CKTOTAL, CKMB, CKMBINDEX, TROPONINI in the last 168  hours. CBG:  Recent Labs Lab 01/26/16 0632 01/26/16 1345 01/26/16 1620 01/26/16 2117 01/27/16 0603  GLUCAP 183* 108* 170* 145* 155*    Iron Studies: No results for input(s): IRON, TIBC, TRANSFERRIN, FERRITIN in the last 72 hours. Studies/Results: No results found. Marland Kitchen. atorvastatin  40 mg Oral QHS  . cholecalciferol  2,000 Units Oral Daily  . darbepoetin (ARANESP) injection - DIALYSIS  100 mcg Intravenous Q Sat-HD  . ferrous sulfate  325 mg Oral Q breakfast  . folic acid  1 mg Oral Daily  . furosemide  160 mg Intravenous TID  . insulin aspart  0-15 Units Subcutaneous TID WC  . insulin aspart  0-5 Units Subcutaneous QHS  . insulin aspart  5 Units Subcutaneous TID WC  . insulin glargine  35 Units Subcutaneous QHS  . ipratropium-albuterol  3 mL Nebulization TID  . levothyroxine  37.5 mcg Oral QAC breakfast  . mouth rinse  15 mL Mouth Rinse BID  . metoprolol  12.5 mg Oral BID  . pantoprazole  40 mg Oral Daily  . senna-docusate  2 tablet Oral QHS  . sertraline  100 mg Oral Daily  . sodium chloride flush  3 mL Intravenous Q12H  . thiamine  100 mg Oral Daily    BMET    Component Value Date/Time   NA 134 (L) 01/27/2016 0153   K 3.8 01/27/2016 0153   CL 98 (L) 01/27/2016 0153   CO2 24 01/27/2016 0153   GLUCOSE 141 (H) 01/27/2016 0153   BUN 19 01/27/2016 0153   CREATININE 3.29 (H) 01/27/2016 0153   CALCIUM 8.6 (L) 01/27/2016 0153   GFRNONAA 18 (L) 01/27/2016 0153   GFRAA 20 (L) 01/27/2016 0153   CBC    Component Value Date/Time   WBC 6.4 01/26/2016 0509   RBC 2.92 (L) 01/26/2016 0509   HGB 8.1 (L) 01/26/2016 0509   HCT 26.8 (L) 01/26/2016 0509   PLT 221 01/26/2016 0509   MCV 91.8 01/26/2016 0509   MCH 27.7 01/26/2016 0509   MCHC 30.2 01/26/2016 0509   RDW 19.7 (H) 01/26/2016 0509   LYMPHSABS 1.5 01/15/2016 0849   MONOABS 0.6 01/15/2016 0849   EOSABS 0.3 01/15/2016 0849   BASOSABS 0.0 01/15/2016 0849     Assessment/Plan:  1. AKI/CKD stage 4 (presumably due to  underlying diabetic nephropathy and hypertensive nephrosclerosis Scr at baseline in the 3's).  AKI related to decompensated Diastolic CHF/cardiorenal syndrome.  Initially responded to high dose diuretics but then became anuric/oliguric and dialysis dependent since 01/18/16 1. He is starting to make some urine so will continue to follow. 2. Next HD for Tuesday but will follow UOP over the next 48 hours.  May want to challenge with IV lasix   2. Diastolic CHF- improved with HD/UF 3. A fib s/p DC cardioversion 01/03/16 by Dr. Shirlee LatchMcLean.  On amio 4. Vascular access- for AVF/AVG and tunneled HD catheter 01/28/16 (rescheduled due elevated INR) 5. HTN- stable 6. DM- per primary 7. Anemia of chronic disease- on ESA and follow iron stores 8. SHPTH- phos at goal.  9. Disposition- continue with PT/OT and will need outpatient arrangements.  Unclear if he will recover renal function.   Irena CordsJoseph A. Judah Carchi, MD [facility] [phone]

## 2016-01-28 ENCOUNTER — Encounter (HOSPITAL_COMMUNITY): Payer: Self-pay | Admitting: Certified Registered"

## 2016-01-28 ENCOUNTER — Inpatient Hospital Stay (HOSPITAL_COMMUNITY): Payer: Medicare HMO | Admitting: Certified Registered"

## 2016-01-28 ENCOUNTER — Encounter (HOSPITAL_COMMUNITY)
Admission: EM | Disposition: A | Discharge status: Home Health | Payer: Self-pay | Source: Home / Self Care | Attending: Internal Medicine

## 2016-01-28 HISTORY — PX: INSERTION OF DIALYSIS CATHETER: SHX1324

## 2016-01-28 HISTORY — PX: AV FISTULA PLACEMENT: SHX1204

## 2016-01-28 LAB — BASIC METABOLIC PANEL
Anion gap: 11 (ref 5–15)
BUN: 31 mg/dL — AB (ref 6–20)
CALCIUM: 9.1 mg/dL (ref 8.9–10.3)
CHLORIDE: 96 mmol/L — AB (ref 101–111)
CO2: 26 mmol/L (ref 22–32)
CREATININE: 4.15 mg/dL — AB (ref 0.61–1.24)
GFR calc non Af Amer: 13 mL/min — ABNORMAL LOW (ref >60)
GFR, EST AFRICAN AMERICAN: 15 mL/min — AB (ref >60)
Glucose, Bld: 201 mg/dL — ABNORMAL HIGH (ref 65–99)
Potassium: 3.7 mmol/L (ref 3.5–5.1)
SODIUM: 133 mmol/L — AB (ref 135–145)

## 2016-01-28 LAB — GLUCOSE, CAPILLARY
GLUCOSE-CAPILLARY: 190 mg/dL — AB (ref 65–99)
GLUCOSE-CAPILLARY: 174 mg/dL — AB (ref 65–99)
GLUCOSE-CAPILLARY: 215 mg/dL — AB (ref 65–99)
GLUCOSE-CAPILLARY: 181 mg/dL — AB (ref 65–99)
Glucose-Capillary: 190 mg/dL — ABNORMAL HIGH (ref 65–99)

## 2016-01-28 LAB — CBC
HCT: 28.9 % — ABNORMAL LOW (ref 39.0–52.0)
Hemoglobin: 8.7 g/dL — ABNORMAL LOW (ref 13.0–17.0)
MCH: 27.9 pg (ref 26.0–34.0)
MCHC: 30.1 g/dL (ref 30.0–36.0)
MCV: 92.6 fL (ref 78.0–100.0)
PLATELETS: 231 10*3/uL (ref 150–400)
RBC: 3.12 MIL/uL — AB (ref 4.22–5.81)
RDW: 19.7 % — AB (ref 11.5–15.5)
WBC: 6.4 10*3/uL (ref 4.0–10.5)

## 2016-01-28 LAB — PROTIME-INR
INR: 1.41
PROTHROMBIN TIME: 17.3 s — AB (ref 11.4–15.2)

## 2016-01-28 SURGERY — ARTERIOVENOUS (AV) FISTULA CREATION
Anesthesia: General | Site: Neck | Laterality: Right

## 2016-01-28 MED ORDER — ROCURONIUM BROMIDE 10 MG/ML (PF) SYRINGE
PREFILLED_SYRINGE | INTRAVENOUS | Status: DC | PRN
  Administered 2016-01-28: 20 mg via INTRAVENOUS

## 2016-01-28 MED ORDER — NEOSTIGMINE METHYLSULFATE 10 MG/10ML IV SOLN
INTRAVENOUS | Status: DC | PRN
  Administered 2016-01-28: 3 mg via INTRAVENOUS

## 2016-01-28 MED ORDER — PROPOFOL 10 MG/ML IV BOLUS
INTRAVENOUS | Status: DC | PRN
  Administered 2016-01-28: 30 mg via INTRAVENOUS

## 2016-01-28 MED ORDER — FENTANYL CITRATE (PF) 250 MCG/5ML IJ SOLN
INTRAMUSCULAR | Status: AC
  Filled 2016-01-28: qty 5

## 2016-01-28 MED ORDER — FENTANYL CITRATE (PF) 100 MCG/2ML IJ SOLN
INTRAMUSCULAR | Status: DC | PRN
  Administered 2016-01-28: 50 ug via INTRAVENOUS

## 2016-01-28 MED ORDER — DEXTROSE 5 % IV SOLN
0.0000 ug/min | INTRAVENOUS | Status: DC
  Administered 2016-01-28: 55 ug/min via INTRAVENOUS
  Filled 2016-01-28: qty 1

## 2016-01-28 MED ORDER — HEPARIN SODIUM (PORCINE) 1000 UNIT/ML IJ SOLN
INTRAMUSCULAR | Status: AC
  Filled 2016-01-28: qty 1

## 2016-01-28 MED ORDER — PROPOFOL 10 MG/ML IV BOLUS: INTRAVENOUS | Status: DC | PRN

## 2016-01-28 MED ORDER — HEPARIN SODIUM (PORCINE) 1000 UNIT/ML IJ SOLN
INTRAMUSCULAR | Status: DC | PRN
  Administered 2016-01-28: 3.4 mL

## 2016-01-28 MED ORDER — LIDOCAINE HCL (PF) 1 % IJ SOLN
INTRAMUSCULAR | Status: DC | PRN
  Administered 2016-01-28: 5 mL

## 2016-01-28 MED ORDER — ROCURONIUM BROMIDE 50 MG/5ML IV SOSY
PREFILLED_SYRINGE | INTRAVENOUS | Status: AC
  Filled 2016-01-28: qty 5

## 2016-01-28 MED ORDER — LACTATED RINGERS IV SOLN
INTRAVENOUS | Status: DC
  Administered 2016-01-28: 18:00:00 via INTRAVENOUS

## 2016-01-28 MED ORDER — WARFARIN SODIUM 5 MG PO TABS
5.0000 mg | ORAL_TABLET | Freq: Once | ORAL | Status: AC
Start: 2016-01-28 — End: 2016-01-28
  Administered 2016-01-28: 5 mg via ORAL
  Filled 2016-01-28 (×2): qty 1

## 2016-01-28 MED ORDER — NEOSTIGMINE METHYLSULFATE 5 MG/5ML IV SOSY
PREFILLED_SYRINGE | INTRAVENOUS | Status: AC
  Filled 2016-01-28: qty 5

## 2016-01-28 MED ORDER — WARFARIN - PHARMACIST DOSING INPATIENT
Freq: Every day | Status: DC
  Administered 2016-01-28: 21:00:00
  Administered 2016-01-29: 1

## 2016-01-28 MED ORDER — IPRATROPIUM-ALBUTEROL 0.5-2.5 (3) MG/3ML IN SOLN
3.0000 mL | Freq: Two times a day (BID) | RESPIRATORY_TRACT | Status: DC
  Administered 2016-01-28 – 2016-01-31 (×6): 3 mL via RESPIRATORY_TRACT
  Filled 2016-01-28 (×7): qty 3

## 2016-01-28 MED ORDER — MEPERIDINE HCL 25 MG/ML IJ SOLN: 6.2500 mg | INTRAMUSCULAR | Status: DC | PRN

## 2016-01-28 MED ORDER — FENTANYL CITRATE (PF) 100 MCG/2ML IJ SOLN: 25.0000 ug | INTRAMUSCULAR | Status: DC | PRN

## 2016-01-28 MED ORDER — ETOMIDATE 2 MG/ML IV SOLN
INTRAVENOUS | Status: AC
  Filled 2016-01-28: qty 10

## 2016-01-28 MED ORDER — GLYCOPYRROLATE 0.2 MG/ML IJ SOLN
INTRAMUSCULAR | Status: DC | PRN
  Administered 2016-01-28: 0.4 mg via INTRAVENOUS

## 2016-01-28 MED ORDER — LIDOCAINE HCL (PF) 1 % IJ SOLN
INTRAMUSCULAR | Status: AC
Start: 2016-01-28 — End: 2016-01-28
  Filled 2016-01-28: qty 30

## 2016-01-28 MED ORDER — 0.9 % SODIUM CHLORIDE (POUR BTL) OPTIME
TOPICAL | Status: DC | PRN
  Administered 2016-01-28: 1000 mL

## 2016-01-28 MED ORDER — SODIUM CHLORIDE 0.9 % IV SOLN
INTRAVENOUS | Status: DC | PRN
  Administered 2016-01-28: 11:00:00

## 2016-01-28 MED ORDER — ETOMIDATE 2 MG/ML IV SOLN
INTRAVENOUS | Status: DC | PRN
  Administered 2016-01-28: 12 mg via INTRAVENOUS

## 2016-01-28 MED ORDER — DEXTROSE 5 % IV SOLN
INTRAVENOUS | Status: AC
  Filled 2016-01-28: qty 1.5

## 2016-01-28 MED ORDER — ONDANSETRON HCL 4 MG/2ML IJ SOLN
INTRAMUSCULAR | Status: AC
  Filled 2016-01-28: qty 2

## 2016-01-28 MED ORDER — SUCCINYLCHOLINE CHLORIDE 200 MG/10ML IV SOSY
PREFILLED_SYRINGE | INTRAVENOUS | Status: AC
  Filled 2016-01-28: qty 10

## 2016-01-28 MED ORDER — SODIUM CHLORIDE 0.9 % IV SOLN
INTRAVENOUS | Status: DC
  Administered 2016-01-28 (×2): via INTRAVENOUS

## 2016-01-28 MED ORDER — OXYCODONE-ACETAMINOPHEN 5-325 MG PO TABS: 1.0000 | ORAL_TABLET | Freq: Four times a day (QID) | ORAL | Status: DC | PRN

## 2016-01-28 MED ORDER — ONDANSETRON HCL 4 MG/2ML IJ SOLN
INTRAMUSCULAR | Status: DC | PRN
  Administered 2016-01-28: 4 mg via INTRAVENOUS

## 2016-01-28 MED ORDER — SUCCINYLCHOLINE CHLORIDE 200 MG/10ML IV SOSY
PREFILLED_SYRINGE | INTRAVENOUS | Status: DC | PRN
  Administered 2016-01-28: 100 mg via INTRAVENOUS

## 2016-01-28 MED ORDER — GLYCOPYRROLATE 0.2 MG/ML IV SOSY
PREFILLED_SYRINGE | INTRAVENOUS | Status: AC
  Filled 2016-01-28: qty 3

## 2016-01-28 MED ORDER — LIDOCAINE 2% (20 MG/ML) 5 ML SYRINGE
INTRAMUSCULAR | Status: DC | PRN
  Administered 2016-01-28: 80 mg via INTRAVENOUS

## 2016-01-28 MED ORDER — PROMETHAZINE HCL 25 MG/ML IJ SOLN: 6.2500 mg | INTRAMUSCULAR | Status: DC | PRN

## 2016-01-28 MED ORDER — PHENYLEPHRINE HCL 10 MG/ML IJ SOLN
INTRAVENOUS | Status: DC | PRN
  Administered 2016-01-28: 35 ug/min via INTRAVENOUS

## 2016-01-28 SURGICAL SUPPLY — 58 items
ARMBAND PINK RESTRICT EXTREMIT (MISCELLANEOUS) ×8 IMPLANT
BAG DECANTER FOR FLEXI CONT (MISCELLANEOUS) ×4 IMPLANT
BIOPATCH RED 1 DISK 7.0 (GAUZE/BANDAGES/DRESSINGS) ×3 IMPLANT
BIOPATCH RED 1IN DISK 7.0MM (GAUZE/BANDAGES/DRESSINGS) ×1
CANISTER SUCTION 2500CC (MISCELLANEOUS) ×4 IMPLANT
CANNULA VESSEL 3MM 2 BLNT TIP (CANNULA) ×4 IMPLANT
CATH PALINDROME RT-P 15FX19CM (CATHETERS) IMPLANT
CATH PALINDROME RT-P 15FX23CM (CATHETERS) ×4 IMPLANT
CATH PALINDROME RT-P 15FX28CM (CATHETERS) IMPLANT
CATH PALINDROME RT-P 15FX55CM (CATHETERS) IMPLANT
CLIP LIGATING EXTRA MED SLVR (CLIP) ×4 IMPLANT
CLIP LIGATING EXTRA SM BLUE (MISCELLANEOUS) ×4 IMPLANT
CLOSURE STERI-STRIP 1/2X4 (GAUZE/BANDAGES/DRESSINGS) ×1
CLSR STERI-STRIP ANTIMIC 1/2X4 (GAUZE/BANDAGES/DRESSINGS) ×3 IMPLANT
COVER PROBE W GEL 5X96 (DRAPES) ×4 IMPLANT
COVER SURGICAL LIGHT HANDLE (MISCELLANEOUS) ×4 IMPLANT
DECANTER SPIKE VIAL GLASS SM (MISCELLANEOUS) ×8 IMPLANT
DERMABOND ADHESIVE PROPEN (GAUZE/BANDAGES/DRESSINGS) ×2
DERMABOND ADVANCED (GAUZE/BANDAGES/DRESSINGS) ×2
DERMABOND ADVANCED .7 DNX12 (GAUZE/BANDAGES/DRESSINGS) ×2 IMPLANT
DERMABOND ADVANCED .7 DNX6 (GAUZE/BANDAGES/DRESSINGS) ×2 IMPLANT
DRAPE C-ARM 42X72 X-RAY (DRAPES) ×4 IMPLANT
DRAPE CHEST BREAST 15X10 FENES (DRAPES) ×4 IMPLANT
ELECT REM PT RETURN 9FT ADLT (ELECTROSURGICAL) ×4
ELECTRODE REM PT RTRN 9FT ADLT (ELECTROSURGICAL) ×2 IMPLANT
GAUZE SPONGE 4X4 12PLY STRL (GAUZE/BANDAGES/DRESSINGS) ×4 IMPLANT
GEL ULTRASOUND 20GR AQUASONIC (MISCELLANEOUS) IMPLANT
GLOVE BIOGEL PI IND STRL 6.5 (GLOVE) ×2 IMPLANT
GLOVE BIOGEL PI IND STRL 7.0 (GLOVE) ×2 IMPLANT
GLOVE BIOGEL PI INDICATOR 6.5 (GLOVE) ×2
GLOVE BIOGEL PI INDICATOR 7.0 (GLOVE) ×2
GLOVE SS BIOGEL STRL SZ 7.5 (GLOVE) ×2 IMPLANT
GLOVE SUPERSENSE BIOGEL SZ 7.5 (GLOVE) ×2
GOWN STRL REUS W/ TWL LRG LVL3 (GOWN DISPOSABLE) ×6 IMPLANT
GOWN STRL REUS W/TWL LRG LVL3 (GOWN DISPOSABLE) ×6
KIT BASIN OR (CUSTOM PROCEDURE TRAY) ×4 IMPLANT
KIT ROOM TURNOVER OR (KITS) ×4 IMPLANT
NEEDLE 18GX1X1/2 (RX/OR ONLY) (NEEDLE) ×4 IMPLANT
NEEDLE 22X1 1/2 (OR ONLY) (NEEDLE) IMPLANT
NEEDLE HYPO 25GX1X1/2 BEV (NEEDLE) ×4 IMPLANT
NS IRRIG 1000ML POUR BTL (IV SOLUTION) ×4 IMPLANT
PACK CV ACCESS (CUSTOM PROCEDURE TRAY) ×4 IMPLANT
PACK SURGICAL SETUP 50X90 (CUSTOM PROCEDURE TRAY) ×4 IMPLANT
PAD ARMBOARD 7.5X6 YLW CONV (MISCELLANEOUS) ×8 IMPLANT
SOAP 2 % CHG 4 OZ (WOUND CARE) ×4 IMPLANT
SUT ETHILON 3 0 PS 1 (SUTURE) ×4 IMPLANT
SUT PROLENE 6 0 CC (SUTURE) ×4 IMPLANT
SUT VIC AB 3-0 SH 27 (SUTURE) ×2
SUT VIC AB 3-0 SH 27X BRD (SUTURE) ×2 IMPLANT
SUT VICRYL 4-0 PS2 18IN ABS (SUTURE) ×4 IMPLANT
SYR 20CC LL (SYRINGE) ×4 IMPLANT
SYR 5ML LL (SYRINGE) ×8 IMPLANT
SYR CONTROL 10ML LL (SYRINGE) ×4 IMPLANT
SYRINGE 10CC LL (SYRINGE) ×4 IMPLANT
TOWEL OR 17X24 6PK STRL BLUE (TOWEL DISPOSABLE) ×4 IMPLANT
TOWEL OR 17X26 10 PK STRL BLUE (TOWEL DISPOSABLE) ×4 IMPLANT
UNDERPAD 30X30 (UNDERPADS AND DIAPERS) ×4 IMPLANT
WATER STERILE IRR 1000ML POUR (IV SOLUTION) ×4 IMPLANT

## 2016-01-28 NOTE — Progress Notes (Signed)
   Subjective: Patient was evaluated this morning on rounds. He states that his breathing is stable. Has no further complaints this morning. Denies any chest pain or shortness of breath.  Objective:  Vital signs in last 24 hours: Vitals:   01/27/16 2117 01/27/16 2246 01/28/16 0545 01/28/16 0806  BP: (!) 89/43 (!) 100/45 (!) 99/46   Pulse: (!) 59 60 (!) 59   Resp: 18  18   Temp: 97.6 F (36.4 C)  98.9 F (37.2 C)   TempSrc: Axillary  Oral   SpO2: 100%  100% 96%  Weight:   250 lb 11.2 oz (113.7 kg)   Height:       Physical Exam  Constitutional:  Obese  Cardiovascular:  Irregularly Irregular  Pulmonary/Chest: Effort normal. No respiratory distress. He has no wheezes. He has no rales.  Bibasilar crackles  Abdominal: Soft. Bowel sounds are normal. There is no tenderness.  Musculoskeletal: He exhibits no edema.     Assessment/Plan:  Principal Problem:   Acute on chronic diastolic CHF (congestive heart failure) (HCC) Active Problems:   IDDM (insulin dependent diabetes mellitus) (HCC)   Obesity (BMI 30-39.9)   Cardiac pacemaker in situ   Hypothyroidism   History of complete heart block   Chronic anticoagulation   OSA (obstructive sleep apnea)   Pressure injury of skin   COPD (chronic obstructive pulmonary disease) (HCC)   Acute renal failure superimposed on chronic kidney disease (HCC)   Essential hypertension   Anemia of chronic disease  Acute on chronic diastolic CHF Patient is stating well on room air. Patient had 1 L of urine output yesterday.  He has been receivingLasix 160mg  TID. He is scheduled for atunnel catheter and fistula/graftinpatient for long-term dialysis today and his INR is 1.41 today. After the procedure patient will need to be restarted on coumadin. - next Hemodialysison 12/12 - Continue Lasix - Monitor I/Os - daily weights - restart coumadin after procedure  Atrial fibrillation  S/P DCCV on 11/16.Patient is in a-fib with rate control.  Heart rates are in the 60s. INR is 1.41. CHAD-Vasc score 4.  - restart coumadin after vascular surgery.  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 Patient had 1 L of urine output yesterday. Will continue lasix and continue to monitor. Condom catheter is in place. Patient has no urinary complaints.  Patient is scheduled for atunnel catheter and fistula/graftinpatient for long-term dialysis today. - next HD on 12/12  Type 2 diabetes  Glucose 201 this am. Currently on Lantus 35 units qhs, Novolog 5 units TID and SSI-m.  -Continue Lantus 35 qhs, SSI-m, Novolog 5 units TID   Anemia of chronic disease Hb 8.7and stable. 8.5 on admit,normal MCV. Baseline is around 9. - Continue home ferrous sulfate  HTN 99/46and stable, may consider fluid bolus, keeping in mind his heart failure, if blood pressures remain low. - continue reducedhome metoprolol to 12.5mg  BID - continue to hold home amlodipine  HLD -continue home atorvastatin  Hypothyroidism: Last TSH 8.141 on 11/9 -Continue Synthroid 25mcg  OSA -CPAP QHS  Dispo: Anticipated discharge pending clinical improvement.   Zachary PhenesJessica Ratliff Hosteen Kienast, DO 01/28/2016, 11:06 AM Pager: [phone]

## 2016-01-28 NOTE — Progress Notes (Signed)
Internal Medicine Attending:   I saw and examined the patient. I reviewed the resident's note and I agree with the resident's findings and plan as documented in the resident's note.  Patient feels well this morning with no new complaints. He is now status post AV fistula placement as well as tunneled HD catheter placement. Nephrology follow-up appreciated. Patient is tentatively scheduled for hemodialysis the morning depending on lab review. His urine output has improved and he put out 1 L yesterday. We'll continue to monitor his urine output closely. Hopefully he will regain some renal function and not require long-term hemodialysis.  We will restart his Coumadin for A. fib today. Coumadin was held for his vascular procedure and his INR was 1.41 today.

## 2016-01-28 NOTE — H&P (View-Only) (Signed)
[facility]  Patient name: Zachary CleverlyMichael D Spalla MRN: [id number] DOB: [birth year] Sex: male  REASON FOR CONSULT: permanent dialysis access, consult is from internal medicine teaching service.   HPI: [name] is a 70 y.o. male, who presents for evaluation for permanent dialysis. He is currently dialyzing via a right IJ temporary catheter. [facility] secondary to CHF exacerbation. He was unable to diurese adequately and dialysis was started to remove excess fluid. The patient has CKD stage IV. It is uncertain whether his kidneys will recover but given advanced CKD, we have been asked to evaluate for permanent access.   The patient is right handed. He has never had access procedures before. He has a left sided pacemaker. He is on coumadin for atrial fibrillation. He underwent cardioversion on 01/03/16 and is currently on amiodarone. He has hyperlipidemia and type 2 diabetes. He is sedentary at home.   He denies any chest pain or shortness of breath today.   Past Medical History:  Diagnosis Date  . Cardiac pacemaker in situ 08/03/2012   Indications transient complete heart block Insertion 08/03/12 Dr. Graciela HusbandsKlein  LV lead Medtronic 5076 serial number [id number]  LA lead Medtronic 5076 serial number [id number] Medtronic MRI compatible pulse generator serial number [id number].       . Chronic diastolic heart failure (HCC)   . Chronic kidney disease (CKD), stage III (moderate) 08/02/2012  . 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 Hyperlipidemia   . Hypertensive heart disease   . Hypothyroidism   . 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 LBBB (left bundle branch block) 08/02/2012  . Obesity (BMI 30-39.9)     No family history on file.  SOCIAL HISTORY: Social History   Social History  . Marital status: Married    Spouse name: N/A  . Number of children: N/A  . Years of  education: N/A   Occupational History  . Not on file.   Social History Main Topics  . Smoking status: Former Games developermoker  . Smokeless tobacco: Never Used  . Alcohol use No  . Drug use: No  . Sexual activity: Not on file   Other Topics Concern  . Not on file   Social History Narrative  . No narrative on file    No Known Allergies  Current Facility-Administered Medications  Medication Dose Route Frequency Provider Last Rate Last Dose  . 0.9 %  sodium chloride infusion  250 mL Intravenous PRN Lora PaulaJennifer T Krall, MD   Stopped at 01/19/16 0530  . acetaminophen (TYLENOL) tablet 650 mg  650 mg Oral Q4H PRN Lora PaulaJennifer T Krall, MD   650 mg at 01/16/16 0636  . amiodarone (NEXTERONE PREMIX) 360-4.14 MG/200ML-% (1.8 mg/mL) IV infusion  30 mg/hr Intravenous Continuous Peter M SwazilandJordan, MD 16.7 mL/hr at 01/21/16 1128 30 mg/hr at 01/21/16 1128  . atorvastatin (LIPITOR) tablet 40 mg  40 mg Oral QHS Lora PaulaJennifer T Krall, MD   40 mg at 01/20/16 2227  . cholecalciferol (VITAMIN D) tablet 2,000 Units  2,000 Units Oral Daily Lora PaulaJennifer T Krall, MD   2,000 Units at 01/21/16 1314  . Darbepoetin Alfa (ARANESP) injection 100 mcg  100 mcg Intravenous Q Sat-HD Delano Metzobert Schertz, MD   100 mcg at 01/19/16 1523  . ferrous sulfate tablet 325 mg  325 mg Oral Q breakfast Lora PaulaJennifer T Krall, MD   325 mg at 01/20/16 0800  . folic acid (FOLVITE) tablet 1  mg  1 mg Oral Daily Lora PaulaJennifer T Krall, MD   1 mg at 01/21/16 1314  . furosemide (LASIX) 120 mg in dextrose 5 % 50 mL IVPB  120 mg Intravenous BID Terrial RhodesJoseph Coladonato, MD      . insulin aspart (novoLOG) injection 0-15 Units  0-15 Units Subcutaneous TID WC Valentino NoseNathan Boswell, MD      . insulin glargine (LANTUS) injection 10 Units  10 Units Subcutaneous QHS Valentino NoseNathan Boswell, MD      . ipratropium-albuterol (DUONEB) 0.5-2.5 (3) MG/3ML nebulizer solution 3 mL  3 mL Nebulization QID Earl LagosNischal Narendra, MD   3 mL at 01/21/16 0742  . levothyroxine (SYNTHROID, LEVOTHROID) tablet 37.5 mcg  37.5 mcg Oral QAC  breakfast Althia FortsAdam Johnson, MD   37.5 mcg at 01/20/16 0800  . MEDLINE mouth rinse  15 mL Mouth Rinse BID Delano Metzobert Schertz, MD   15 mL at 01/21/16 0730  . metoprolol tartrate (LOPRESSOR) tablet 12.5 mg  12.5 mg Oral BID Lora PaulaJennifer T Krall, MD   12.5 mg at 01/21/16 1314  . ondansetron (ZOFRAN) injection 4 mg  4 mg Intravenous Q6H PRN Lora PaulaJennifer T Krall, MD      . pantoprazole (PROTONIX) EC tablet 40 mg  40 mg Oral Daily Lora PaulaJennifer T Krall, MD   40 mg at 01/21/16 1313  . senna-docusate (Senokot-S) tablet 2 tablet  2 tablet Oral QHS Lora PaulaJennifer T Krall, MD   2 tablet at 01/20/16 2228  . sertraline (ZOLOFT) tablet 100 mg  100 mg Oral Daily Lora PaulaJennifer T Krall, MD   100 mg at 01/21/16 1314  . sodium chloride flush (NS) 0.9 % injection 10-40 mL  10-40 mL Intracatheter PRN Levert FeinsteinJames M Granfortuna, MD   10 mL at 01/20/16 2340  . sodium chloride flush (NS) 0.9 % injection 3 mL  3 mL Intravenous Q12H Lora PaulaJennifer T Krall, MD   3 mL at 01/21/16 1000  . sodium chloride flush (NS) 0.9 % injection 3 mL  3 mL Intravenous PRN Lora PaulaJennifer T Krall, MD      . thiamine (VITAMIN B-1) tablet 100 mg  100 mg Oral Daily Lora PaulaJennifer T Krall, MD   100 mg at 01/21/16 1313  . THROMBI-PAD (THROMBI-PAD) 3"X3" pad 1 each  1 each Topical Once PRN Bethany Molt, DO      . warfarin (COUMADIN) tablet 1 mg  1 mg Oral ONCE-1800 Armandina StammerNathan J Batchelder, RPH      . Warfarin - Pharmacist Dosing Inpatient   Does not apply q1800 Faye Ramsayachel L Rumbarger, RPH        REVIEW OF SYSTEMS:  [X]  denotes positive finding, [ ]  denotes negative finding Cardiac  Comments:  Chest pain or chest pressure:    Shortness of breath upon exertion: x   Short of breath when lying flat: x   Irregular heart rhythm: x       Vascular    Pain in calf, thigh, or hip brought on by ambulation:    Pain in feet at night that wakes you up from your sleep:     Blood clot in your veins:    Leg swelling:         Pulmonary    Oxygen at home:    Productive cough:     Wheezing:         Neurologic      Sudden weakness in arms or legs:     Sudden numbness in arms or legs:     Sudden onset of difficulty speaking or slurred speech:  Temporary loss of vision in one eye:     Problems with dizziness:         Gastrointestinal    Blood in stool:     Vomited blood:         Genitourinary    Burning when urinating:     Blood in urine:        Psychiatric    Major depression:         Hematologic    Bleeding problems:    Problems with blood clotting too easily:        Skin    Rashes or ulcers:        Constitutional    Fever or chills:      PHYSICAL EXAM: Vitals:   01/21/16 1100 01/21/16 1130 01/21/16 1200 01/21/16 1220  BP: (!) 117/51 (!) 134/48 136/60 (!) (P) 140/50  Pulse: 68 63 65 (P) 67  Resp: 16 (!) 23 19 (P) 19  Temp:    (P) 98 F (36.7 C)  TempSrc:    (P) Oral  SpO2: 96% 100% 100% (P) 100%  Weight:    (P) 248 lb 14.4 oz (112.9 kg)  Height:        GENERAL: The patient is a well-nourished male, in no acute distress. The vital signs are documented above. CARDIAC: There is a regular rate and rhythm.  VASCULAR: 2+ brachial and radial pulses bilaterally. Feet warm and pink bilaterally.  PULMONARY: Non labored respiratory effort.  ABDOMEN: Soft and non-tender with normal pitched bowel sounds.  MUSCULOSKELETAL: There are no major deformities or cyanosis. NEUROLOGIC: No focal weakness or paresthesias are detected. SKIN: Sporadic ecchymosis and scratches of arms and legs.  PSYCHIATRIC: The patient has a normal affect.   MEDICAL ISSUES: CKD stage IV  The patient is current dialyzing via a right IJ temporary catheter. He has not been declared ESRD yet and is undergoing diuresis per primary team. Vein mapping ordered. He is right handed. He has a left sided pacemaker.  He is therapeutic on coumadin for atrial fibrillation. His INR will need to be corrected if he were to undergo surgery while inpatient. Otherwise, we can schedule as an outpatient. Await vein mapping results.     Maris Berger, PA-C [facility] [place] [phone]   I have examined the patient, reviewed and agree with above.Had long discussion with the patient, his wife and daughter present. Explained options for tunneled catheter, AV fistula and AV graft. Await vein map for recommendation. It with his left sided pacemaker would plan right arm access. Will restrict right arm from IV and blood draws. We'll make a formal recommendation following vein map  Gretta Began, MD 01/21/2016 5:24 PM

## 2016-01-28 NOTE — Anesthesia Preprocedure Evaluation (Addendum)
Anesthesia Evaluation  Patient identified by MRN, date of birth, ID band Patient awake    Reviewed: Allergy & Precautions, NPO status , Patient's Chart, lab work & pertinent test results, reviewed documented beta blocker date and time   Airway Mallampati: IV       Dental  (+) Upper Dentures, Lower Dentures   Pulmonary sleep apnea , COPD, former smoker,    breath sounds clear to auscultation       Cardiovascular hypertension, Pt. on home beta blockers and Pt. on medications +CHF  + dysrhythmias  Rhythm:Irregular Rate:Normal     Neuro/Psych negative neurological ROS  negative psych ROS   GI/Hepatic negative GI ROS, Neg liver ROS,   Endo/Other  diabetes, Type 2, Insulin DependentHypothyroidism   Renal/GU ESRF and DialysisRenal disease  negative genitourinary   Musculoskeletal negative musculoskeletal ROS (+)   Abdominal   Peds negative pediatric ROS (+)  Hematology negative hematology ROS (+)   Anesthesia Other Findings   Reproductive/Obstetrics negative OB ROS                            Lab Results  Component Value Date   WBC 6.4 01/28/2016   HGB 8.7 (L) 01/28/2016   HCT 28.9 (L) 01/28/2016   MCV 92.6 01/28/2016   PLT 231 01/28/2016   Lab Results  Component Value Date   CREATININE 4.15 (H) 01/28/2016   BUN 31 (H) 01/28/2016   NA 133 (L) 01/28/2016   K 3.7 01/28/2016   CL 96 (L) 01/28/2016   CO2 26 01/28/2016   Lab Results  Component Value Date   INR 1.41 01/28/2016   INR 1.77 01/27/2016   INR 2.06 01/26/2016   EKG: normal sinus rhythm, PVC's.   Anesthesia Physical Anesthesia Plan  ASA: III  Anesthesia Plan: General   Post-op Pain Management:    Induction: Intravenous  Airway Management Planned: Oral ETT  Additional Equipment:   Intra-op Plan:   Post-operative Plan: Extubation in OR  Informed Consent: I have reviewed the patients History and Physical,  chart, labs and discussed the procedure including the risks, benefits and alternatives for the proposed anesthesia with the patient or authorized representative who has indicated his/her understanding and acceptance.   Dental advisory given  Plan Discussed with: CRNA  Anesthesia Plan Comments:       Anesthesia Quick Evaluation

## 2016-01-28 NOTE — Progress Notes (Signed)
ANTICOAGULATION CONSULT NOTE - Follow up Consult  Pharmacy Consult for Coumadin Indication: atrial fibrillation   Allergies  Allergen Reactions  . No Known Allergies     Patient Measurements: Height: 5\' 11"  (180.3 cm) Weight: 250 lb 11.2 oz (113.7 kg) IBW/kg (Calculated) : 75.3   Vital Signs: Temp: 97.8 F (36.6 C) (12/11 1315) Temp Source: Oral (12/11 0545) BP: 99/46 (12/11 0545) Pulse Rate: 59 (12/11 0545)  Assessment: 70yom on coumadin pta for afib. INR elevated on admit at 4.68 and patient was bleeding from IV site so vitamin k 5mg  was given. Once INR fell < 2, coumadin was resumed and we tried to bridge with heparin, but he still had significant bleeding from IV site so heparin stopped. Coumadin was then held indefinitely (last dose 12/5) pending AV graft placement. He is now s/p procedure and coumadin to resume tonight. INR 1.41.  Home dose: 2.5mg  on Tues/Wed/Thurs/Sat/Sun and no dose on Mon/Fri.   Goal of Therapy:  INR 2-3  Monitor platelets by anticoagulation protocol: Yes   Plan:  1) Coumadin 5mg  x 1 2) Daily INR  [name], PharmD, BCPS 01/28/2016 3:44 PM

## 2016-01-28 NOTE — Progress Notes (Signed)
SLP Cancellation Note  Patient Details Name: Zadie CleverlyMichael D Keckler MRN: [id number] DOB: [birth year]   Cancelled treatment:       Reason Eval/Treat Not Completed: Patient at procedure or test/unavailable  Ferdinand LangoLeah Keianna Signer MA, CCC-SLP [phone]  Ferdinand LangoMcCoy Rosalba Totty Meryl 01/28/2016, 9:46 AM

## 2016-01-28 NOTE — Anesthesia Postprocedure Evaluation (Signed)
Anesthesia Post Note  Patient: Veverly FellsMichael D Fraser  Procedure(s) Performed: Procedure(s) (LRB): Right first stage basilic vein transposition (brachiobasilic arteriovenous fistula) placement (Right) INSERTION OF DIALYSIS CATHETER RIGHT INTERNAL JUGULAR (Right)  Patient location during evaluation: PACU Anesthesia Type: General Level of consciousness: awake and alert Pain management: pain level controlled Vital Signs Assessment: post-procedure vital signs reviewed and stable Respiratory status: spontaneous breathing, nonlabored ventilation, respiratory function stable and patient connected to nasal cannula oxygen Cardiovascular status: blood pressure returned to baseline and stable Postop Assessment: no signs of nausea or vomiting Anesthetic complications: no    Last Vitals:  Vitals:   01/28/16 1715 01/28/16 1756  BP: (!) 102/39 (!) 96/43  Pulse: 62 61  Resp: 16 17  Temp: 36.4 C     Last Pain:  Vitals:   01/28/16 0745  TempSrc:   PainSc: 0-No pain                 Shelton SilvasKevin D Hollis

## 2016-01-28 NOTE — Transfer of Care (Signed)
Immediate Anesthesia Transfer of Care Note  Patient: [name]  Procedure(s) Performed: Procedure(s): Right first stage basilic vein transposition (brachiobasilic arteriovenous fistula) placement (Right) INSERTION OF DIALYSIS CATHETER RIGHT INTERNAL JUGULAR (Right)  Patient Location: PACU  Anesthesia Type:General  Level of Consciousness: awake, alert , oriented and patient cooperative  Airway & Oxygen Therapy: Patient Spontanous Breathing and Patient connected to face mask oxygen  Post-op Assessment: Report given to RN, Post -op Vital signs reviewed and stable and Patient moving all extremities  Post vital signs: Reviewed and stable  Last Vitals:  Vitals:   01/27/16 2246 01/28/16 0545  BP: (!) 100/45 (!) 99/46  Pulse: 60 (!) 59  Resp:  18  Temp:  37.2 C    Last Pain:  Vitals:   01/28/16 0745  TempSrc:   PainSc: 0-No pain         Complications: No apparent anesthesia complications

## 2016-01-28 NOTE — Op Note (Signed)
OPERATIVE NOTE   PROCEDURE: 1.  Exchange of right temporary dialysis catheter for tunneled dialysis catheter  2.  Right first stage basilic vein transposition (brachiobasilic arteriovenous fistula) placement  PRE-OPERATIVE DIAGNOSIS: end stage renal disease  POST-OPERATIVE DIAGNOSIS: same as above   SURGEON: Leonides SakeBrian Chen, MD  ASSISTANT(S): RNFA  ANESTHESIA: general  ESTIMATED BLOOD LOSS: 30 cc  FINDING(S): 1. Tips of tunneled dialysis catheter in superior vena cava/right atrium 2. No pneumothorax noted on fluoroscopy 3. Palpable thrill at end of case 4. Palpable radial pulse at end of case  SPECIMEN(S):  none  INDICATIONS:   [name] is a 70 y.o. male who presents with end stage renal disease.  The patient is scheduled for right tunneled dialysis catheter placement and right arm arteriovenous fistula vs graft placement.  The patient is aware the risks of tunneled dialysis catheter placement include but are not limited to: bleeding, infection, central venous injury, pneumothorax, possible venous stenosis, possible malpositioning in the venous system, and possible infections related to long-term catheter presence.  The patient is aware the risks include but are not limited to: bleeding, infection, steal syndrome, nerve damage, ischemic monomelic neuropathy, failure to mature, and need for additional procedures.  The patient is aware of the risks of the procedure and elects to proceed forward.   DESCRIPTION:  After written full informed consent was obtained from the patient, the patient was taken back to the operating room.  Prior to induction, the patient was given IV antibiotics.  After obtaining adequate sedation, the patient was prepped and draped in the standard fashion for a chest or neck tunneled dialysis catheter placement.  I transected the prior temporary dialysis catheter, revealing the lumens of the catheter.  The catheter was secured with a clamp and one lumen  was cannulated with the J-wire, which was advanced into the inferior vena cava under fluroscopic guidance.  The wire was then secured in place with a clamp to the drapes.  I then extended the neck incision with a 11-blade.  I exchanged the old residual dialysis catheter for the dilator-sheath, which was placed under fluroscopic guidance into the superior vena cava.  The dilator and wire were removed.  I loaded a 23 cm Palindrome catheter into the right atrium under fluoroscopic guidance.  The sheath was broken and peeled away while holding the catheter cuff at the level of the skin.    I made a stab incision at the chest exit site.   I dissected from the exit site to the neck site with a metal tunneler.   The back end of the catheter was transected and I loaded the catheter onto the metal tunneler.  I delivered the catheter through the subcutaneous tunnel to exit site.  The back end of this catheter was transected again, revealing the two lumens of this catheter.  The ports were docked onto these two lumens.  The catheter hub was then clicked into place.  Each port was tested by aspirating and flushing.  No resistance was noted.  Each port was then thoroughly flushed with heparinized saline.  The catheter was secured in placed with two interrupted stitches of 3-0 Nylon tied to the catheter.  The neck incision was closed with a U-stitch of 4-0 Monocryl.  The neck and chest incision were cleaned and sterile bandages applied.  Each port was then loaded with concentrated heparin (1000 Units/mL) at the manufacturer recommended volumes to each port.  Sterile caps were applied to each port.  On completion fluoroscopy, the tips of the catheter were in the right atrium, and there was no evidence of pneumothorax.   At this point, the drapes were removed and the patient was repositioned for a left arm access procedure.  He was reprepped and redraped.  I turned my attention first to identifying the patient's basilic  vein and brachial artery.  Using SonoSite guidance, the location of these vessels were marked out on the skin.   At this point, I injected local anesthetic to obtain a field block of the antecubitum.  In total, I injected about 5 mL of 1% lidocaine without epinephrine.  I made a transverse incision at the level of the antecubitum and dissected through the subcutaneous tissue and fascia to gain exposure of the brachial artery.  This was noted to be 3 mm in diameter externally.  This was dissected out proximally and distally and controlled with vessel loops .  I then dissected out the basilic vein.  This was noted to be 3 mm in diameter externally.  The distal segment of the vein was ligated with a  2-0 silk, and the vein was transected.  The proximal segment was interrogated with serial dilators.  The vein accepted up to a 4 mm dilator without any difficulty.  I then instilled the heparinized saline into the vein and clamped it.  At this point, I reset my exposure of the brachial artery and placed the artery under tension proximally and distally.  I made an arteriotomy with a #11 blade, and then I extended the arteriotomy with a Potts scissor.  I injected heparinized saline proximal and distal to this arteriotomy.  The vein was then sewn to the artery in an end-to-side configuration with a running stitch of 7-0 Prolene.  Prior to completing this anastomosis, I allowed the vein and artery to backbleed.  There was no evidence of clot from any vessels.  I completed the anastomosis in the usual fashion and then released all vessel loops and clamps.  There was a palpable thrill in the venous outflow, and there was a palpable radial pulse.  At this point, I irrigated out the surgical wound.  There was no further active bleeding.  The subcutaneous tissue was reapproximated with a running stitch of 3-0 Vicryl.  The skin was then reapproximated with a running subcuticular stitch of 4-0 Monocryl.  The skin was then cleaned,  dried, and reinforced with Dermabond.  The patient tolerated this procedure well.    COMPLICATIONS: none  CONDITION: stable   Leonides SakeBrian Chen, MD, [facility] [place] Office: [phone] Pager: [phone]  01/28/2016, 11:59 AM

## 2016-01-28 NOTE — Anesthesia Procedure Notes (Signed)
Procedure Name: Intubation Date/Time: 01/28/2016 11:19 AM Performed by: Lucinda DellECARLO, Jonni Oelkers M Pre-anesthesia Checklist: Patient identified, Emergency Drugs available, Suction available and Patient being monitored Patient Re-evaluated:Patient Re-evaluated prior to inductionOxygen Delivery Method: Circle system utilized Preoxygenation: Pre-oxygenation with 100% oxygen Intubation Type: IV induction Ventilation: Mask ventilation without difficulty and Oral airway inserted - appropriate to patient size Laryngoscope Size: Mac and 4 Grade View: Grade I Tube type: Oral Tube size: 7.5 mm Number of attempts: 1 Airway Equipment and Method: Stylet Placement Confirmation: ETT inserted through vocal cords under direct vision,  positive ETCO2 and breath sounds checked- equal and bilateral Secured at: 22 cm Tube secured with: Tape Dental Injury: Teeth and Oropharynx as per pre-operative assessment

## 2016-01-28 NOTE — Progress Notes (Signed)
PT Cancellation Note  Patient Details Name: [name] MRN: [id number] DOB: May 24, 1945   Cancelled Treatment:    Reason Eval/Treat Not Completed: Patient at procedure or test/unavailable. Will try again later.   Angelina OkCary W Maycok 01/28/2016, 9:06 AM Skip Mayerary Yesennia Hirota PT [phone]

## 2016-01-28 NOTE — Progress Notes (Signed)
Inpatient Diabetes Program Recommendations  AACE/ADA: New Consensus Statement on Inpatient Glycemic Control (2015)  Target Ranges:  Prepandial:   less than 140 mg/dL      Peak postprandial:   less than 180 mg/dL (1-2 hours)      Critically ill patients:  140 - 180 mg/dL   Results for [name], [name] (MRN [id number]) as of 01/28/2016 11:40  Ref. Range 01/27/2016 06:03 01/27/2016 10:57 01/27/2016 16:21 01/27/2016 21:13  Glucose-Capillary Latest Ref Range: 65 - 99 mg/dL 409155 (H) 811167 (H) 914218 (H) 223 (H)   Results for [name], [name] (MRN [id number]) as of 01/28/2016 11:40  Ref. Range 01/28/2016 06:17 01/28/2016 09:37  Glucose-Capillary Latest Ref Range: 65 - 99 mg/dL 086190 (H) 578174 (H)    Home DM Meds: Lantus 52 units QHS       Novolog 20 units TID  Current Insulin Orders: Lantus 35 units QHS      Novolog Moderate Correction Scale/ SSI (0-15 units) TID AC + HS      Novolog 5 units TID with meals      MD- Please consider the following in-hospital insulin adjustments:  1. Increase Lantus slightly to 38 units QHS  2. Increase Novolog Meal Coverage to: Novolog 7 units TID with meals (hold if pt eats <50% of meal)     --Will follow patient during hospitalization--  Ambrose FinlandJeannine Johnston Kizer Nobbe RN, MSN, CDE Diabetes Coordinator Inpatient Glycemic Control Team Team Pager: [phone] (8a-5p)

## 2016-01-28 NOTE — Progress Notes (Signed)
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 atorvastatin  40 mg Oral QHS  . cholecalciferol  2,000 Units Oral Daily  . darbepoetin (ARANESP) injection - DIALYSIS  100 mcg Intravenous Q Sat-HD  . ferrous sulfate  325 mg Oral Q breakfast  . folic acid  1 mg Oral Daily  . furosemide  160 mg Intravenous TID  . insulin aspart  0-15 Units Subcutaneous TID WC  . insulin aspart  0-5 Units Subcutaneous QHS  . insulin aspart  5 Units Subcutaneous TID WC  . insulin glargine  35 Units Subcutaneous QHS  . ipratropium-albuterol  3 mL Nebulization TID  . levothyroxine  37.5 mcg Oral QAC breakfast  . mouth rinse  15 mL Mouth Rinse BID  . metoprolol  12.5 mg Oral BID  . pantoprazole  40 mg Oral Daily  . polyethylene glycol  17 g Oral Daily  . senna-docusate  2 tablet Oral QHS  . sertraline  100 mg Oral Daily  . sodium chloride flush  3 mL Intravenous Q12H  . thiamine  100 mg Oral Daily   Continuous Infusions: PRN Meds:.sodium chloride, acetaminophen, ondansetron (ZOFRAN) IV, sodium chloride flush, THROMBI-PAD  Current Labs: reviewed    Physical Exam:  Blood pressure (!) 99/46, pulse (!) 59, temperature 98.9 F (37.2 C), temperature source Oral, resp. rate 18, height 5\' 11"  (1.803 m), weight 113.7 kg (250 lb 11.2 oz), SpO2 96 %. Obese, nad,  RRR Bibasilar crackles, nl wob No LEE R IJ Temp HD cath in place  A 1. AoCKD4, dialysis dependwent on THS schedule 2. dCHF on TID lasix 160mg  IV 3. 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 HTN 5. DM2 6. 2HPTH 7. Anemia on ESA qSat  P 1. Agree with TDC  and AV access today 2. Tentative for HD tomorrow but will review labs prior 3. Daily weights, Daily Renal Panel, Strict I/Os, Avoid nephrotoxins (NSAIDs, judicious IV Contrast)   Sabra Heckyan Elvia Aydin MD 01/28/2016, 8:32 AM   Recent Labs Lab 01/25/16 0349 01/26/16 0509 01/27/16 0153 01/28/16 0615  NA 133*  134* 137 134* 133*  K 3.8  3.8 3.8 3.8 3.7  CL 95*  96* 98* 98* 96*  CO2 22  23 25 24 26   GLUCOSE 268*  270* 216* 141* 201*  BUN 36*  35* 44* 19 31*  CREATININE 5.32*  5.34* 5.69* 3.29* 4.15*  CALCIUM 9.0  9.0 9.2 8.6* 9.1  PHOS 4.8* 5.5* 3.2  --     Recent Labs Lab 01/25/16 0349 01/26/16 0509 01/28/16 0615  WBC 6.9 6.4 6.4  HGB 8.0* 8.1* 8.7*  HCT 26.6* 26.8* 28.9*  MCV 91.4 91.8 92.6  PLT 219 221 231            \

## 2016-01-28 NOTE — Interval H&P Note (Signed)
[facility] [place]  History and Physical Update  The patient was interviewed and re-examined.  The patient's previous History and Physical has been reviewed and is unchanged from Dr. Arbie CookeyEarly consult except for: interval reversal of anticoagulation.  There is no change in the plan of care: [facility], R arm access placement.   The patient is aware the risks of tunneled dialysis catheter placement include but are not limited to: bleeding, infection, central venous injury, pneumothorax, possible venous stenosis, possible malpositioning in the venous system, and possible infections related to long-term catheter presence.  Risk, benefits, and alternatives to access surgery were discussed.   The patient is aware the risks include but are not limited to: bleeding, infection, steal syndrome, nerve damage, ischemic monomelic neuropathy, failure to mature, need for additional procedures, death and stroke.    The patient was aware of these risks and agreed to proceed.  Leonides SakeBrian Chen, MD [facility] [place] Office: [phone] Pager: [phone]  01/28/2016, 10:37 AM

## 2016-01-29 ENCOUNTER — Encounter (HOSPITAL_COMMUNITY): Payer: Self-pay | Admitting: Vascular Surgery

## 2016-01-29 LAB — GLUCOSE, CAPILLARY
GLUCOSE-CAPILLARY: 180 mg/dL — AB (ref 65–99)
GLUCOSE-CAPILLARY: 130 mg/dL — AB (ref 65–99)
GLUCOSE-CAPILLARY: 140 mg/dL — AB (ref 65–99)
Glucose-Capillary: 243 mg/dL — ABNORMAL HIGH (ref 65–99)

## 2016-01-29 LAB — RENAL FUNCTION PANEL
ANION GAP: 12 (ref 5–15)
Albumin: 2.4 g/dL — ABNORMAL LOW (ref 3.5–5.0)
BUN: 36 mg/dL — ABNORMAL HIGH (ref 6–20)
CALCIUM: 9 mg/dL (ref 8.9–10.3)
CHLORIDE: 98 mmol/L — AB (ref 101–111)
CO2: 23 mmol/L (ref 22–32)
Creatinine, Ser: 4.34 mg/dL — ABNORMAL HIGH (ref 0.61–1.24)
GFR calc non Af Amer: 13 mL/min — ABNORMAL LOW (ref >60)
GFR, EST AFRICAN AMERICAN: 15 mL/min — AB (ref >60)
GLUCOSE: 265 mg/dL — AB (ref 65–99)
Phosphorus: 4.7 mg/dL — ABNORMAL HIGH (ref 2.5–4.6)
Potassium: 4.3 mmol/L (ref 3.5–5.1)
SODIUM: 133 mmol/L — AB (ref 135–145)

## 2016-01-29 LAB — PROTIME-INR
INR: 1.48
Prothrombin Time: 18.1 seconds — ABNORMAL HIGH (ref 11.4–15.2)

## 2016-01-29 MED ORDER — WARFARIN SODIUM 2.5 MG PO TABS
2.5000 mg | ORAL_TABLET | Freq: Once | ORAL | Status: AC
Start: 2016-01-29 — End: 2016-01-29
  Administered 2016-01-29: 2.5 mg via ORAL
  Filled 2016-01-29: qty 1

## 2016-01-29 NOTE — Progress Notes (Signed)
Internal Medicine Attending:   I saw and examined the patient. I reviewed the resident's note and I agree with the resident's findings and plan as documented in the resident's note.  Patient feels well today with no new complaints. He states his breathing has improved. He is now status post tunneled HD catheter placement and AV fistula surgery done yesterday. We will continue to monitor urine output but he still had only minimal output over the last 24 hours. Case discussed with nephrology who will reevaluate him tomorrow and follow-up blood work to see if he will require hemodialysis. No hemodialysis scheduled for today. It is likely however the patient will require HD tomorrow and outpatient hemodialysis placement. We'll continue with Coumadin for his A. fib. No further workup for now.

## 2016-01-29 NOTE — Evaluation (Addendum)
Clinical/Bedside Swallow Evaluation Patient Details  Name: [name] MRN: [id number] Date of Birth: 1946-02-01  Today's Date: 01/29/2016 Time: SLP Start Time (ACUTE ONLY): 1310 SLP Stop Time (ACUTE ONLY): 1330 SLP Time Calculation (min) (ACUTE ONLY): 20 min  Past Medical History:  Past Medical History:  Diagnosis Date  . Cardiac pacemaker in situ 08/03/2012   Indications transient complete heart block Insertion 08/03/12 Dr. Graciela HusbandsKlein  LV lead Medtronic 5076 serial number [id number]  LA lead Medtronic 5076 serial number [id number] Medtronic MRI compatible pulse generator serial number [id number] H.       . Chronic diastolic heart failure (HCC)   . Chronic kidney disease (CKD), stage III (moderate) 08/02/2012  . 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 Hyperlipidemia   . Hypertensive heart disease   . Hypothyroidism   . 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 LBBB (left bundle branch block) 08/02/2012  . Obesity (BMI 30-39.9)    Past Surgical History:  Past Surgical History:  Procedure Laterality Date  . AV FISTULA PLACEMENT Right 01/28/2016   Procedure: Right first stage basilic vein transposition (brachiobasilic arteriovenous fistula) placement;  Surgeon: Fransisco HertzBrian L Chen, MD;  [facility];  Service: Vascular;  Laterality: Right;  . CARDIOVERSION N/A 01/03/2016   Procedure: CARDIOVERSION;  Surgeon: Laurey Moralealton S McLean, MD;  [facility];  Service: Cardiovascular;  Laterality: N/A;  . INSERTION OF DIALYSIS CATHETER Right 01/28/2016   Procedure: INSERTION OF DIALYSIS CATHETER RIGHT INTERNAL JUGULAR;  Surgeon: Fransisco HertzBrian L Chen, MD;  [facility];  Service: Vascular;  Laterality: Right;  . PERMANENT PACEMAKER INSERTION N/A 08/03/2012   Procedure: PERMANENT PACEMAKER INSERTION;  Surgeon: Duke SalviaSteven C Klein, MD;  [facility];  Service: Cardiovascular;  Laterality: N/A;   HPI:  The patient is a 70 y.o.year-old with history of DM, HTN, afib ,  diast CHF, HL and obesity, [facility], admitted with SOB and decomp CHF on 01/15/16. Getting high dose diuretics and still SOB. Has transitioned from BIPAP to [place].    Assessment / Plan / Recommendation Clinical Impression  Pt appears at reduced risk for aspiration on current diet. Skilled observation of dysphagia 3 with thin liquids observed. Pt didn't display any overt s/s of aspiration during consumption. Pt currently on room air and maintained O2 stats during consumption. Pt, wife and nursing don't endorse any acute dysphagia and no none reason for new BSE. Pt and wife are agreeable to reamining on dysphagia 3 diet d/t broken lower dentures. Education provided to nursing. ST to sign off at this time.     Aspiration Risk  Mild aspiration risk    Diet Recommendation Dysphagia 3 (Mech soft);Thin liquid   Liquid Administration via: Straw;Cup Medication Administration: Whole meds with puree Supervision: Patient able to self feed Compensations: Minimize environmental distractions;Slow rate;Small sips/bites;Follow solids with liquid Postural Changes: Seated upright at 90 degrees    Other  Recommendations Oral Care Recommendations: Oral care BID   Follow up Recommendations None       Swallow Study   General Date of Onset: 01/28/16 HPI: The patient is a 70 y.o.year-old with history of DM, HTN, afib , diast CHF, HL and obesity, [facility] CKD [facility], admitted with SOB and decomp CHF on 01/15/16. Getting high dose diuretics and still SOB. Has transitioned from BIPAP to [place].  Type of Study: Bedside Swallow Evaluation Previous Swallow Assessment:  (BSE - 12/2 -  recommend dysphagia 2 with thin liquids) Diet Prior to this Study: Dysphagia 3 (soft);Thin liquids Temperature Spikes Noted: No Respiratory Status: Room air History of Recent Intubation: No Behavior/Cognition: Alert;Cooperative;Pleasant mood Oral Cavity  Assessment: Within Functional Limits Oral Care Completed by SLP: No Oral Cavity - Dentition: Dentures, top Vision: Functional for self-feeding Self-Feeding Abilities: Able to feed self Patient Positioning: Upright in chair Baseline Vocal Quality: Normal Volitional Cough: Strong Volitional Swallow: Able to elicit    Oral/Motor/Sensory Function Overall Oral Motor/Sensory Function: Within functional limits   Ice Chips Ice chips: Within functional limits Presentation: Self Fed;Spoon   Thin Liquid Thin Liquid: Within functional limits Presentation: Cup;Straw;Self Fed    Nectar Thick Nectar Thick Liquid: Not tested   Honey Thick Honey Thick Liquid: Not tested   Puree Puree: Within functional limits Presentation: Self Fed;Spoon   Solid   GO   Solid: Within functional limits Presentation: Self Fed;Spoon       [name], M.S., CCC-SLP Speech-Language Pathologist  [name] 01/29/2016,3:32 PM

## 2016-01-29 NOTE — Progress Notes (Signed)
Pt was placed on cpap tolerating well

## 2016-01-29 NOTE — Progress Notes (Signed)
  Postoperative hemodialysis access     Date of Surgery:  01/28/16 Surgeon: Imogene Burnhen  Subjective:  No complaints; specifically denies hand pain/numbness when asked  PHYSICAL EXAMINATION:  Vitals:   01/28/16 1928 01/29/16 0632  BP: (!) 95/47 (!) 101/42  Pulse: 60 64  Resp: 18 20  Temp: 97.4 F (36.3 C) 98 F (36.7 C)    Incision is clean and dry Sensation in digits is intact;  There is  Thrill  There is bruit. The graft/fistula is palpable    ASSESSMENT/PLAN:  Zadie CleverlyMichael D Maguire is a 70 y.o. year old male who is s/p  1.  Exchange of right temporary dialysis catheter for tunneled dialysis catheter  2.  Right first stage basilic vein transposition (brachiobasilic arteriovenous fistula) placement  -graft/fistula is patent -pt does not have evidence of steal sx -f/u with Dr. Imogene Burnhen in 4-6 weeks to check maturation of AVF.  Our office will arrange. -will sign off-call as needed.   Doreatha MassedSamantha Ram Haugan, PA-C [facility] [phone]

## 2016-01-29 NOTE — Progress Notes (Signed)
   Subjective: Patient was evaluated this morning on rounds. He was sitting up in his recliner chair. He states his breathing is doing well. He does not report issues with his fistula surgery yesterday.    Objective:  Vital signs in last 24 hours: Vitals:   01/28/16 1928 01/28/16 2120 01/29/16 0632 01/29/16 1132  BP: (!) 95/47  (!) 101/42 (!) 96/57  Pulse: 60  64 76  Resp: 18  20 18   Temp: 97.4 F (36.3 C)  98 F (36.7 C) 98.2 F (36.8 C)  TempSrc: Oral  Axillary Oral  SpO2: 100% 100% 97% 90%  Weight:   243 lb 8 oz (110.5 kg)   Height:       Physical Exam  Constitutional: No distress.  obese  Cardiovascular:  Irregularly Irregular  Pulmonary/Chest: Effort normal. No respiratory distress. He has no wheezes.  Bibasilar crackles  Abdominal: Soft. He exhibits no distension. There is no tenderness.  Musculoskeletal: He exhibits no edema.    Assessment/Plan:  Principal Problem:   Acute on chronic diastolic CHF (congestive heart failure) (HCC) Active Problems:   IDDM (insulin dependent diabetes mellitus) (HCC)   Obesity (BMI 30-39.9)   Cardiac pacemaker in situ   Hypothyroidism   History of complete heart block   Chronic anticoagulation   OSA (obstructive sleep apnea)   Pressure injury of skin   COPD (chronic obstructive pulmonary disease) (HCC)   Acute renal failure superimposed on chronic kidney disease (HCC)   Essential hypertension   Anemia of chronic disease  Acute on chronic diastolic CHF Patient is stating well on room air. Patient had 150cc urine output yesterday.  He has been receivingLasix 160mg  TID. He had atunnel catheter and fistula/graft yesterday.  He was restarted on coumadin and his INR is 1.48today. Nephrology is following and will re-evaluate tomorrow for hemodialysis and beginning the clip process. - hold Hemodialysistoday and potential HD tomorrow with CLIP - Continue Lasix - Monitor I/Os - daily weights - restart coumadin after  procedure  Atrial fibrillation  S/P DCCV on 11/16.Patient is in a-fib with rate control. Heart rates are in the 60s -70s. INR is 1.48. CHAD-Vasc score 4.  - coumadin per pharm.  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 Patient had 150cc urine output yesterday. Will continue lasix and continue to monitor. Condom catheter is in place. Patient has no urinary complaints.  Patient had atunnel catheter and fistula/graftyesterday.  When evaluated by vascular surgery today patient did not have evidence of steal symptoms and did not have issues at the site. Recommendations include follow up with Dr. Imogene Burnhen in 4-6 weeks.   - Reassess tomorrow for HD  Type 2 diabetes  Glucose 265this am. Currently on Lantus 35 units qhs, Novolog 5 units TID and SSI-m.  -Continue Lantus 35 qhs, SSI-m, Novolog 5 units TID   Anemia of chronic disease Hb 8.7and stable. 8.5 on admit,normal MCV. Baseline is around 9. - Continue home ferrous sulfate  HTN 96/57and stable, may consider fluid bolus, keeping in mind his heart failure, if blood pressures remain low. - continue reducedhome metoprolol to 12.5mg  BID - continue to hold home amlodipine  HLD -continue home atorvastatin  Hypothyroidism: Last TSH 8.141 on 11/9 -Continue Synthroid 25mcg  OSA -CPAP QHS  Dispo: Anticipated discharge once CLIP process is completed.   Camelia PhenesJessica Ratliff Nylia Gavina, DO 01/29/2016, 3:23 PM Pager: [phone]

## 2016-01-29 NOTE — Progress Notes (Signed)
ANTICOAGULATION CONSULT NOTE - Follow up Consult  Pharmacy Consult for Coumadin Indication: atrial fibrillation   Allergies  Allergen Reactions  . No Known Allergies     Patient Measurements: Height: 5\' 11"  (180.3 cm) Weight: 243 lb 8 oz (110.5 kg) (bed) IBW/kg (Calculated) : 75.3   Vital Signs: Temp: 98 F (36.7 C) (12/12 0632) Temp Source: Axillary (12/12 0632) BP: 101/42 (12/12 0632) Pulse Rate: 64 (12/12 0632)  Assessment: 70yom on coumadin pta for afib. INR elevated on admit at 4.68 and patient was bleeding from IV site so vitamin k 5mg  was given. Once INR fell < 2, coumadin was resumed and we tried to bridge with heparin, but he still had significant bleeding from IV site so heparin stopped. Coumadin was then held indefinitely (last dose 12/5) pending AV graft placement. He is now s/p procedure and coumadin resumed 12/11. INR this AM 1.48 after 1 dose of 5 mg. No overt signs of bleeding noted. Will give lower dose this PM due to the fact that the effects of 5 mg are unlikely to be seen before tomorrow.   Home dose: 2.5mg  on Tues/Wed/Thurs/Sat/Sun and no dose on Mon/Fri.   Goal of Therapy:  INR 2-3  Monitor platelets by anticoagulation protocol: Yes   Plan:  1) Coumadin 2.5 mg x 1 2) Daily INR  Bailey MechEmily Stewart, PharmD Pharmacy Resident Pager: [phone]  01/29/2016 11:18 AM

## 2016-01-29 NOTE — Progress Notes (Signed)
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 atorvastatin  40 mg Oral QHS  . cholecalciferol  2,000 Units Oral Daily  . darbepoetin (ARANESP) injection - DIALYSIS  100 mcg Intravenous Q Sat-HD  . ferrous sulfate  325 mg Oral Q breakfast  . folic acid  1 mg Oral Daily  . furosemide  160 mg Intravenous TID  . insulin aspart  0-15 Units Subcutaneous TID WC  . insulin aspart  0-5 Units Subcutaneous QHS  . insulin aspart  5 Units Subcutaneous TID WC  . insulin glargine  35 Units Subcutaneous QHS  . ipratropium-albuterol  3 mL Nebulization BID  . levothyroxine  37.5 mcg Oral QAC breakfast  . mouth rinse  15 mL Mouth Rinse BID  . metoprolol  12.5 mg Oral BID  . pantoprazole  40 mg Oral Daily  . polyethylene glycol  17 g Oral Daily  . senna-docusate  2 tablet Oral QHS  . sertraline  100 mg Oral Daily  . sodium chloride flush  3 mL Intravenous Q12H  . thiamine  100 mg Oral Daily  . Warfarin - Pharmacist Dosing Inpatient   Does not apply q1800   Continuous Infusions: . sodium chloride 10 mL/hr at 01/28/16 2124   PRN Meds:.sodium chloride, acetaminophen, ondansetron (ZOFRAN) IV, oxyCODONE-acetaminophen, sodium chloride flush, THROMBI-PAD  Current Labs: reviewed    Physical Exam:  Blood pressure (!) 101/42, pulse 64, temperature 98 F (36.7 C), temperature source Axillary, resp. rate 20, height 5\' 11"  (1.803 m), weight 110.5 kg (243 lb 8 oz), SpO2 97 %. Obese, nad,  RRR Bibasilar crackles, nl wob No LEE R IJ Temp HD cath in place  A 1. AoCKD4, dialysis  dependwent on THS schedule 2. dCHF on TID lasix 160mg  IV 3. 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 HTN 5. DM2 6. 2HPTH 7. Anemia on ESA qSat  P 1. Agree with TDC and AV access today 2. HOld HD today; re-eval tomorrow but likely to resume HD and will CLIP if so 3. Daily weights, Daily Renal Panel, Strict I/Os, Avoid nephrotoxins (NSAIDs, judicious IV Contrast)   Sabra Heckyan Nikolaus Pienta MD 01/29/2016, 10:20 AM   Recent Labs Lab 01/26/16 0509 01/27/16 0153 01/28/16 0615 01/29/16 0411  NA 137 134* 133* 133*  K 3.8 3.8 3.7 4.3  CL 98* 98* 96* 98*  CO2 25 24 26 23   GLUCOSE 216* 141* 201* 265*  BUN 44* 19 31* 36*  CREATININE 5.69* 3.29* 4.15* 4.34*  CALCIUM 9.2 8.6* 9.1 9.0  PHOS 5.5* 3.2  --  4.7*    Recent Labs Lab 01/25/16 0349 01/26/16 0509 01/28/16 0615  WBC 6.9 6.4 6.4  HGB 8.0* 8.1* 8.7*  HCT 26.6* 26.8* 28.9*  MCV 91.4 91.8 92.6  PLT 219 221 231            \

## 2016-01-30 LAB — BASIC METABOLIC PANEL
ANION GAP: 13 (ref 5–15)
BUN: 45 mg/dL — AB (ref 6–20)
CHLORIDE: 97 mmol/L — AB (ref 101–111)
CO2: 24 mmol/L (ref 22–32)
Calcium: 9.3 mg/dL (ref 8.9–10.3)
Creatinine, Ser: 4.52 mg/dL — ABNORMAL HIGH (ref 0.61–1.24)
GFR, EST AFRICAN AMERICAN: 14 mL/min — AB (ref >60)
GFR, EST NON AFRICAN AMERICAN: 12 mL/min — AB (ref >60)
Glucose, Bld: 169 mg/dL — ABNORMAL HIGH (ref 65–99)
POTASSIUM: 3.9 mmol/L (ref 3.5–5.1)
SODIUM: 134 mmol/L — AB (ref 135–145)

## 2016-01-30 LAB — GLUCOSE, CAPILLARY
GLUCOSE-CAPILLARY: 245 mg/dL — AB (ref 65–99)
GLUCOSE-CAPILLARY: 265 mg/dL — AB (ref 65–99)
GLUCOSE-CAPILLARY: 204 mg/dL — AB (ref 65–99)
GLUCOSE-CAPILLARY: 140 mg/dL — AB (ref 65–99)
Glucose-Capillary: 238 mg/dL — ABNORMAL HIGH (ref 65–99)
Glucose-Capillary: 156 mg/dL — ABNORMAL HIGH (ref 65–99)
Glucose-Capillary: 213 mg/dL — ABNORMAL HIGH (ref 65–99)

## 2016-01-30 LAB — PROTIME-INR
INR: 1.62
Prothrombin Time: 19.4 seconds — ABNORMAL HIGH (ref 11.4–15.2)

## 2016-01-30 MED ORDER — INSULIN ASPART 100 UNIT/ML ~~LOC~~ SOLN
5.0000 [IU] | Freq: Three times a day (TID) | SUBCUTANEOUS | Status: DC
  Administered 2016-01-30 – 2016-02-01 (×5): 5 [IU] via SUBCUTANEOUS

## 2016-01-30 MED ORDER — WARFARIN SODIUM 2.5 MG PO TABS
2.5000 mg | ORAL_TABLET | Freq: Once | ORAL | Status: AC
  Administered 2016-01-30: 2.5 mg via ORAL
  Filled 2016-01-30: qty 1

## 2016-01-30 MED ORDER — LEVOTHYROXINE SODIUM 75 MCG PO TABS
37.5000 ug | ORAL_TABLET | Freq: Every day | ORAL | Status: DC
  Administered 2016-01-30 – 2016-02-01 (×3): 37.5 ug via ORAL
  Filled 2016-01-30 (×3): qty 1

## 2016-01-30 MED ORDER — FUROSEMIDE 80 MG PO TABS
160.0000 mg | ORAL_TABLET | Freq: Two times a day (BID) | ORAL | Status: DC
  Administered 2016-01-30 – 2016-02-01 (×5): 160 mg via ORAL
  Filled 2016-01-30: qty 2
  Filled 2016-01-30: qty 4
  Filled 2016-01-30 (×3): qty 2

## 2016-01-30 NOTE — Progress Notes (Signed)
Internal Medicine Attending:   I saw and examined the patient. I reviewed the resident's note and I agree with the resident's findings and plan as documented in the resident's note.  Patient feels well today with no new complaints. He was able to put out approximately 550 mL of urine yesterday. His renal function remains stable at this time and we will continue to hold dialysis at this point to see if his kidneys continue to recover. We will monitor daily weights and BMP as well as strict I's and O's. No further workup for now. He'll continue to be monitored as an inpatient to assess if he will need dialysis or if his kidneys will continue to recover.

## 2016-01-30 NOTE — Progress Notes (Signed)
ANTICOAGULATION CONSULT NOTE - Follow up Consult  Pharmacy Consult for Coumadin Indication: atrial fibrillation   Allergies  Allergen Reactions  . No Known Allergies     Patient Measurements: Height: 5\' 11"  (180.3 cm) Weight: 246 lb 3.2 oz (111.7 kg) IBW/kg (Calculated) : 75.3   Vital Signs: Temp: 98.8 F (37.1 C) (12/13 0510) Temp Source: Oral (12/13 0510) BP: 111/38 (12/13 0510) Pulse Rate: 63 (12/13 0934)  Assessment: 70yom on coumadin pta for afib. INR elevated on admit at 4.68 and patient was bleeding from IV site so vitamin k 5mg  was given. Once INR fell < 2, coumadin was resumed and we tried to bridge with heparin, but he still had significant bleeding from IV site so heparin stopped. Coumadin was then held indefinitely (last dose 12/5) pending AV graft placement. He is now s/p procedure and coumadin resumed 12/11. INR this AM 1.62 . No overt signs of bleeding noted. Patient on synthroid as PTA which can increase warfarin levels.   Home dose: 2.5mg  on Tues/Wed/Thurs/Sat/Sun and no dose on Mon/Fri.   Goal of Therapy:  INR 2-3  Monitor platelets by anticoagulation protocol: Yes   Plan:  Coumadin 2.5 mg x 1 Daily INR Monitor for signs/symptoms of bleeding   Bailey MechEmily Stewart, PharmD Pharmacy Resident Pager: [phone]  01/30/2016 10:20 AM

## 2016-01-30 NOTE — Progress Notes (Signed)
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 No edema Labs about he same; slight worsened SCr, UOP at least 0.5L  12/12 0701 - 12/13 0700 In: 960.2 [P.O.:655; I.V.:171.2] Out: 551 [Urine:550; Stool:1]  Filed Weights   01/28/16 0545 01/29/16 0632 01/30/16 0510  Weight: 113.7 kg (250 lb 11.2 oz) 110.5 kg (243 lb 8 oz) 111.7 kg (246 lb 3.2 oz)    Scheduled Meds: . atorvastatin  40 mg Oral QHS  . cholecalciferol  2,000 Units Oral Daily  . darbepoetin (ARANESP) injection - DIALYSIS  100 mcg Intravenous Q Sat-HD  . ferrous sulfate  325 mg Oral Q breakfast  . folic acid  1 mg Oral Daily  . furosemide  160 mg Oral BID  . insulin aspart  0-15 Units Subcutaneous TID WC  . insulin aspart  0-5 Units Subcutaneous QHS  . insulin aspart  5 Units Subcutaneous TID WC  . insulin glargine  35 Units Subcutaneous QHS  . ipratropium-albuterol  3 mL Nebulization BID  . levothyroxine  37.5 mcg Oral QAC breakfast  . mouth rinse  15 mL Mouth Rinse BID  . metoprolol  12.5 mg Oral BID  . pantoprazole  40 mg Oral Daily  . polyethylene glycol  17 g Oral Daily  . senna-docusate  2 tablet Oral QHS  . sertraline  100 mg Oral Daily  . sodium chloride flush  3 mL Intravenous Q12H  . thiamine  100 mg Oral Daily  . Warfarin - Pharmacist Dosing Inpatient   Does not apply q1800   Continuous Infusions: . sodium chloride 10 mL/hr at 01/28/16 2124   PRN Meds:.sodium chloride, acetaminophen, ondansetron (ZOFRAN) IV, oxyCODONE-acetaminophen, sodium chloride flush, THROMBI-PAD  Current Labs: reviewed    Physical Exam:  Blood pressure (!) 111/38, pulse 63, temperature 98.8 F (37.1 C), temperature source Oral, resp. rate 18, height 5\' 11"  (1.803 m), weight 111.7 kg (246 lb 3.2 oz), SpO2 96 %. Obese, nad,  RRR Bibasilar crackles, nl wob No LEE R IJ Temp HD cath in place  A 1. AoCKD4,  dialysis dependent but some suggestion of sufficient GFR 2. dCHF on TID lasix 160mg  IV 3. 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 HTN 5. DM2 6. 2HPTH 7. Anemia on ESA qSat  P 1. Cont to hold on dialysis given ongoing suggestion of sufficient renal function for volume status and holding GFR 2. Closely monitor 3. Daily weights, Daily Renal Panel, Strict I/Os, Avoid nephrotoxins (NSAIDs, judicious IV Contrast)   Sabra Heckyan Trenten Watchman MD 01/30/2016, 9:58 AM   Recent Labs Lab 01/26/16 0509 01/27/16 0153 01/28/16 0615 01/29/16 0411 01/30/16 0359  NA 137 134* 133* 133* 134*  K 3.8 3.8 3.7 4.3 3.9  CL 98* 98* 96* 98* 97*  CO2 25 24 26 23 24   GLUCOSE 216* 141* 201* 265* 169*  BUN 44* 19 31* 36* 45*  CREATININE 5.69* 3.29* 4.15* 4.34* 4.52*  CALCIUM 9.2 8.6* 9.1 9.0 9.3  PHOS 5.5* 3.2  --  4.7*  --     Recent Labs Lab 01/25/16 0349 01/26/16 0509 01/28/16 0615  WBC 6.9 6.4 6.4  HGB 8.0* 8.1* 8.7*  HCT 26.6* 26.8* 28.9*  MCV 91.4 91.8 92.6  PLT 219 221 231            \

## 2016-01-30 NOTE — Progress Notes (Signed)
Physical Therapy Treatment Patient Details Name: Zachary CleverlyMichael D Rottmann MRN: [id number] DOB: 27-Jul-1945 Today's Date: 01/30/2016    History of Present Illness Pt is a 70 y/o male admitted secondary to abdominal tightness and SOB, found to have pneumonia. PMH including but not limited to CHF, CKD, DM and pacemaker placement in 2014.    PT Comments    Pt presented sitting OOB in recliner when PT entered room. Pt making slow progress with mobility and continues to fatigue quickly with ambulation. Pt would continue to benefit from skilled physical therapy services at this time while admitted and after d/c to address his limitations in order to improve his overall safety and independence with functional mobility.   Follow Up Recommendations  Home health PT;Supervision/Assistance - 24 hour (pt continues to refuse SNF )     Equipment Recommendations  None recommended by PT    Recommendations for Other Services       Precautions / Restrictions Precautions Precautions: Fall Restrictions Weight Bearing Restrictions: No    Mobility  Bed Mobility               General bed mobility comments: pt sitting OOB in recliner when PT entered room  Transfers Overall transfer level: Needs assistance Equipment used: Rolling walker (2 wheeled) Transfers: Sit to/from Stand Sit to Stand: Min assist         General transfer comment: pt required increased time, VC'ing for bilateral hand placement and min A to achieve full standing from recliner  Ambulation/Gait Ambulation/Gait assistance: Mod assist;+2 safety/equipment Ambulation Distance (Feet): 50 Feet Assistive device: Rolling walker (2 wheeled) Gait Pattern/deviations: Step-through pattern;Decreased stride length;Ataxic;Staggering right;Staggering left Gait velocity: decreased Gait velocity interpretation: Below normal speed for age/gender General Gait Details: pt with moderate instability initially with gait, improving slightly with  practice. pt continues to require a close chair follow secondary to very quickly fatiguing   Stairs            Wheelchair Mobility    Modified Rankin (Stroke Patients Only)       Balance Overall balance assessment: Needs assistance Sitting-balance support: Feet supported;No upper extremity supported Sitting balance-Leahy Scale: Fair     Standing balance support: During functional activity;Bilateral upper extremity supported Standing balance-Leahy Scale: Poor Standing balance comment: pt reliant on bilateral UEs on RW, min guard for safety with static standing                    Cognition Arousal/Alertness: Awake/alert Behavior During Therapy: WFL for tasks assessed/performed Overall Cognitive Status: Within Functional Limits for tasks assessed                      Exercises      General Comments        Pertinent Vitals/Pain Pain Assessment: No/denies pain    Home Living                      Prior Function            PT Goals (current goals can now be found in the care plan section) Acute Rehab PT Goals Patient Stated Goal: return home PT Goal Formulation: With patient/family Time For Goal Achievement: 02/02/16 Potential to Achieve Goals: Fair Progress towards PT goals: Progressing toward goals    Frequency    Min 3X/week      PT Plan Current plan remains appropriate    Co-evaluation  End of Session Equipment Utilized During Treatment: Gait belt Activity Tolerance: Patient limited by fatigue Patient left: in chair;with call bell/phone within reach;with family/visitor present     Time: 1353-1405 PT Time Calculation (min) (ACUTE ONLY): 12 min  Charges:  $Gait Training: 8-22 mins                    G CodesAlessandra Bevels:      Liyla Radliff M Glennice Marcos 01/30/2016, 2:44 PM Deborah ChalkJennifer Schyler Counsell, PT, DPT [phone]

## 2016-01-30 NOTE — Progress Notes (Signed)
   Subjective: Patient was evaluated this morning on rounds.  He states his breathing is stable.  He denies chest pain or shortness of breath.  He has no complaints.  Objective:  Vital signs in last 24 hours: Vitals:   01/29/16 2037 01/29/16 2117 01/29/16 2124 01/30/16 0510  BP: (!) 96/38   (!) 111/38  Pulse: 76  78 61  Resp: 18  16 18   Temp: 98.2 F (36.8 C)   98.8 F (37.1 C)  TempSrc: Axillary   Oral  SpO2: 99% 91% 92% 98%  Weight:    246 lb 3.2 oz (111.7 kg)  Height:       Physical Exam  Constitutional:  Obese  Cardiovascular: Exam reveals no gallop and no friction rub.   No murmur heard. Irregularly Irregular  Pulmonary/Chest: Effort normal. No respiratory distress. He has no wheezes.  Bibasilar crackles  Abdominal: Soft. He exhibits no distension. There is no tenderness.  Musculoskeletal: He exhibits no edema.  Skin: Skin is warm and dry.  Psychiatric: Mood and affect normal.    Assessment/Plan:  Principal Problem:   Acute on chronic diastolic CHF (congestive heart failure) (HCC) Active Problems:   IDDM (insulin dependent diabetes mellitus) (HCC)   Obesity (BMI 30-39.9)   Cardiac pacemaker in situ   Hypothyroidism   History of complete heart block   Chronic anticoagulation   OSA (obstructive sleep apnea)   Pressure injury of skin   COPD (chronic obstructive pulmonary disease) (HCC)   Acute renal failure superimposed on chronic kidney disease (HCC)   Essential hypertension   Anemia of chronic disease  Acute on chronic diastolic CHF Patient is stating well on room air. Patient had 150cc urine output yesterday. He has been receivingLasix 160mg  BID.  He was restarted on coumadin after fistula procedure and hisINR is 1.62today.  - hold Hemodialysistoday and re-evaluate labs and UOP tomorrow - Continue Lasix - Monitor I/Os - daily weights  Atrial fibrillation  S/P DCCV on 11/16.Patient is in a-fib with rate control.Heart rates are in the 60s -70s.  INR is 1.62. CHAD-Vasc score 4.  - coumadin per pharm.  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 Patient had 550cc urine output yesterday.  Will continue lasix and continue to monitor. Condom catheter is in place. Patient has no urinary complaints. Nephrology recommends holding dialysis as renal function continues to be stable.    - Reassess tomorrow for HD -BMET  Type 2 diabetes  Glucose 169this am. Currently on Lantus 35 units qhs, Novolog 5 units TIDand SSI-m.  -Continue Lantus 35 qhs, SSI-m, Novolog 5 units TID  Anemia of chronic disease Hb 8.7on 12/11 and stable. 8.5 on admit,normal MCV. Baseline is around 9. - Continue home ferrous sulfate  HTN 111/38and stable, may consider fluid bolus, keeping in mind his heart failure, if blood pressures remain low. - continue reducedhome metoprolol to 12.5mg  BID - continue to hold home amlodipine  HLD -continue home atorvastatin  Hypothyroidism: Last TSH 8.141 on 11/9 -Continue Synthroid 25mcg  OSA -CPAP QHS  Dispo: Anticipated discharge pending clinical improvement.   Camelia PhenesJessica Ratliff Areil Ottey, DO 01/30/2016, 8:52 AM Pager: [phone]

## 2016-01-31 LAB — BASIC METABOLIC PANEL
ANION GAP: 14 (ref 5–15)
BUN: 52 mg/dL — AB (ref 6–20)
CO2: 24 mmol/L (ref 22–32)
Calcium: 9.2 mg/dL (ref 8.9–10.3)
Chloride: 96 mmol/L — ABNORMAL LOW (ref 101–111)
Creatinine, Ser: 4.53 mg/dL — ABNORMAL HIGH (ref 0.61–1.24)
GFR calc Af Amer: 14 mL/min — ABNORMAL LOW (ref >60)
GFR calc non Af Amer: 12 mL/min — ABNORMAL LOW (ref >60)
GLUCOSE: 171 mg/dL — AB (ref 65–99)
POTASSIUM: 3.7 mmol/L (ref 3.5–5.1)
Sodium: 134 mmol/L — ABNORMAL LOW (ref 135–145)

## 2016-01-31 LAB — CBC
HEMATOCRIT: 28.9 % — AB (ref 39.0–52.0)
Hemoglobin: 8.7 g/dL — ABNORMAL LOW (ref 13.0–17.0)
MCH: 28.3 pg (ref 26.0–34.0)
MCHC: 30.1 g/dL (ref 30.0–36.0)
MCV: 94.1 fL (ref 78.0–100.0)
Platelets: 252 10*3/uL (ref 150–400)
RBC: 3.07 MIL/uL — AB (ref 4.22–5.81)
RDW: 20.2 % — ABNORMAL HIGH (ref 11.5–15.5)
WBC: 5.8 10*3/uL (ref 4.0–10.5)

## 2016-01-31 LAB — GLUCOSE, CAPILLARY
GLUCOSE-CAPILLARY: 135 mg/dL — AB (ref 65–99)
GLUCOSE-CAPILLARY: 55 mg/dL — AB (ref 65–99)
GLUCOSE-CAPILLARY: 78 mg/dL (ref 65–99)
Glucose-Capillary: 169 mg/dL — ABNORMAL HIGH (ref 65–99)
Glucose-Capillary: 149 mg/dL — ABNORMAL HIGH (ref 65–99)

## 2016-01-31 LAB — PHOSPHORUS: Phosphorus: 5.5 mg/dL — ABNORMAL HIGH (ref 2.5–4.6)

## 2016-01-31 LAB — PROTIME-INR
INR: 2.09
Prothrombin Time: 23.8 seconds — ABNORMAL HIGH (ref 11.4–15.2)

## 2016-01-31 MED ORDER — INSULIN ASPART 100 UNIT/ML ~~LOC~~ SOLN
0.0000 [IU] | Freq: Three times a day (TID) | SUBCUTANEOUS | Status: DC
Start: 2016-02-01 — End: 2016-02-01
  Administered 2016-02-01 (×2): 1 [IU] via SUBCUTANEOUS

## 2016-01-31 MED ORDER — WARFARIN SODIUM 2.5 MG PO TABS
2.5000 mg | ORAL_TABLET | Freq: Once | ORAL | Status: AC
  Administered 2016-01-31: 2.5 mg via ORAL
  Filled 2016-01-31: qty 1

## 2016-01-31 MED ORDER — IPRATROPIUM-ALBUTEROL 0.5-2.5 (3) MG/3ML IN SOLN: 3.0000 mL | RESPIRATORY_TRACT | Status: DC | PRN

## 2016-01-31 NOTE — Progress Notes (Signed)
Patient placed on CPAP without complication. RT will continue to monitor as needed. 

## 2016-01-31 NOTE — Progress Notes (Signed)
   Subjective: Patient was evaluated this morning. He was sitting comfortably in bed and states his breathing is stable. He denies any chest pain, abdominal pain or shortness of breath. He has no further complaints this morning.  Objective:  Vital signs in last 24 hours: Vitals:   01/30/16 2014 01/30/16 2123 01/31/16 0442 01/31/16 0500  BP:  (!) 98/48 (!) 92/40   Pulse:  62 62   Resp:  19    Temp:  97.8 F (36.6 C) 97.7 F (36.5 C)   TempSrc:  Oral Oral   SpO2: 94% 95% 97%   Weight:    241 lb 14.4 oz (109.7 kg)  Height:       Physical Exam  Cardiovascular:  Irregularly Irregular  Pulmonary/Chest: Effort normal.  Bibasilar crackles  Abdominal: Soft. Bowel sounds are normal. He exhibits no distension. There is no tenderness.  Musculoskeletal: He exhibits no edema.     Assessment/Plan:  Principal Problem:   Acute on chronic diastolic CHF (congestive heart failure) (HCC) Active Problems:   IDDM (insulin dependent diabetes mellitus) (HCC)   Obesity (BMI 30-39.9)   Cardiac pacemaker in situ   Hypothyroidism   History of complete heart block   Chronic anticoagulation   OSA (obstructive sleep apnea)   Pressure injury of skin   COPD (chronic obstructive pulmonary disease) (HCC)   Acute renal failure superimposed on chronic kidney disease (HCC)   Essential hypertension   Anemia of chronic disease  Acute on chronic diastolic CHF Patient is stating well on room air. Patient had 675cc urine output yesterday. He has been receivingLasix 160mg  BID. He was restarted on coumadinafter fistula procedure and hisINR is 2.09today. Currently 241 lbs down from 270s on admission. - Continue Lasix - Monitor I/Os - daily weights  Atrial fibrillation  S/P DCCV on 11/16.Patient is in a-fib with rate control.Heart rates are in the 60s -70s. INR is 2.09. CHAD-Vasc score 4.  - coumadin per pharm.  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  Patient had 675cc urine output yesterday.  Will continue lasix and continue to monitor. Condom catheter is in place. Patient has no urinary complaints. Nephrology recommends holding dialysis as renal function continues to be stable.  If renal function stable for another 24hrs can begin plans for DC and outpatient follow up   - Reassess tomorrow for HD -BMET  Type 2 diabetes  Glucose 171this am. Currently on Lantus 35 units qhs, Novolog 5 units TIDand SSI-m.  -Continue Lantus 35 qhs, SSI-m, Novolog 5 units TID  Anemia of chronic disease Hb 8.7on 12/11 and stable. 8.5 on admit,normal MCV. Baseline is around 9. - Continue home ferrous sulfate  HTN 92/40and stable, may consider fluid bolus, keeping in mind his heart failure, if blood pressures drop further. - continue reducedhome metoprolol to 12.5mg  BID - continue to hold home amlodipine  HLD -continue home atorvastatin  Hypothyroidism: Last TSH 8.141 on 11/9 -Continue Synthroid 25mcg  OSA -CPAP QHS  Dispo: Anticipated discharge pending clinical improvement.   Camelia PhenesJessica Ratliff Sussie Minor, DO 01/31/2016, 9:08 AM Pager: [phone]

## 2016-01-31 NOTE — Progress Notes (Signed)
ANTICOAGULATION CONSULT NOTE - Follow up Consult  Pharmacy Consult for Coumadin Indication: atrial fibrillation   Allergies  Allergen Reactions  . No Known Allergies     Patient Measurements: Height: 5\' 11"  (180.3 cm) Weight: 241 lb 14.4 oz (109.7 kg) IBW/kg (Calculated) : 75.3   Vital Signs: Temp: 97.7 F (36.5 C) (12/14 0442) Temp Source: Oral (12/14 0442) BP: 98/68 (12/14 1100) Pulse Rate: 68 (12/14 1012)  Assessment: 70yom on coumadin pta for afib. INR elevated on admit at 4.68 and patient was bleeding from IV site so vitamin k 5mg  was given. Once INR fell < 2, coumadin was resumed tried to bridge with heparin, but he still had significant bleeding from IV site so heparin was stopped. Coumadin was then held (last dose 12/5) pending AV graft placement. He is now s/p procedure and coumadin resumed 12/11.   INR this therapeutic today at 2.09, which has been steadily increasing over the past few days. Hemoglobin is low but stable, as patient has anemia of CKD. No overt signs of bleeding noted.  Home dose: 2.5mg  on Tues/Wed/Thurs/Sat/Sun and no dose on Mon/Fri.   Will continue with patient's home dose.  Goal of Therapy:  INR 2-3  Monitor platelets by anticoagulation protocol: Yes   Plan:  Warfarin 2.5 mg PO x 1 Daily INR Monitor for signs/symptoms of bleeding   Carylon PerchesMaggie Shuda, PharmD Acute Care Pharmacy Resident  Pager: [phone] 01/31/2016

## 2016-01-31 NOTE — Progress Notes (Signed)
Physical Therapy Treatment Patient Details Name: [name] MRN: [id number] DOB: 1946/02/10 Today's Date: 01/31/2016    History of Present Illness [name] is a 70 y/o male admitted secondary to abdominal tightness and SOB, found to have pneumonia. PMH including but not limited to CHF, CKD, DM and pacemaker placement in 2014.    [name] Comments    [name] presented sitting EOB when [name] entered room. [name] making slow progress with mobility, but tolerated two bouts of ambulation this session with a sitting rest break in between. [name] with no complaints this session. [name] would continue to benefit from skilled physical therapy services at this time while admitted and after d/c to address his below listed limitations in order to improve his overall safety and independence with functional mobility.   Follow Up Recommendations  Home health [name];Supervision/Assistance - 24 hour ([name] continuing to refuse SNF)     Equipment Recommendations  None recommended by [name]    Recommendations for Other Services       Precautions / Restrictions Precautions Precautions: Fall Restrictions Weight Bearing Restrictions: No    Mobility  Bed Mobility               General bed mobility comments: [name] sitting EOB when [name] entered room  Transfers Overall transfer level: Needs assistance Equipment used: Rolling walker (2 wheeled) Transfers: Sit to/from Stand Sit to Stand: Min assist         General transfer comment: transfer performed x2 (once from bed and once from recliner). [name] required increased time, VC'ing for bilateral hand placement and min A to achieve full standing from bed and recliner  Ambulation/Gait Ambulation/Gait assistance: Mod assist;+2 safety/equipment Ambulation Distance (Feet): 50 Feet (x2 with sitting rest break in between) Assistive device: Rolling walker (2 wheeled) Gait Pattern/deviations: Step-through pattern;Decreased stride length;Ataxic;Staggering right;Staggering left Gait velocity:  decreased Gait velocity interpretation: Below normal speed for age/gender General Gait Details: [name] with moderate instability initially with gait, improving slightly with practice. [name] continues to require a close chair follow secondary to very quickly fatiguing   Stairs            Wheelchair Mobility    Modified Rankin (Stroke Patients Only)       Balance Overall balance assessment: Needs assistance Sitting-balance support: Feet supported;No upper extremity supported Sitting balance-Leahy Scale: Fair     Standing balance support: During functional activity;Bilateral upper extremity supported Standing balance-Leahy Scale: Poor Standing balance comment: [name] reliant on bilateral UEs on RW, min guard for safety with static standing                    Cognition Arousal/Alertness: Awake/alert Behavior During Therapy: WFL for tasks assessed/performed Overall Cognitive Status: Within Functional Limits for tasks assessed                      Exercises      General Comments        Pertinent Vitals/Pain Pain Assessment: No/denies pain    Home Living                      Prior Function            [name] Goals (current goals can now be found in the care plan section) Acute Rehab [name] Goals Patient Stated Goal: return home [name] Goal Formulation: With patient/family Time For Goal Achievement: 02/02/16 Potential to Achieve Goals: Fair Progress towards [name] goals: Progressing toward goals    Frequency  Min 3X/week      [name] Plan Current plan remains appropriate    Co-evaluation             End of Session Equipment Utilized During Treatment: Gait belt Activity Tolerance: Patient limited by fatigue Patient left: in chair;with call bell/phone within reach;with family/visitor present     Time: [id number] [name] Time Calculation (min) (ACUTE ONLY): 22 min  Charges:  $Gait Training: 8-22 mins                    G CodesAlessandra Bevels:      [name]  [name] 01/31/2016, 3:10 PM [name], [name], [name] [phone]

## 2016-01-31 NOTE — Progress Notes (Signed)
Admit: 01/15/2016 LOS: 15  73F AoCKD4 2/2 dCHF exacerbation; BL SCr 3s; dialysis dependent  Subjective:  No new events At least 0.6L UOP yesterday, 2 unmeasured voids SCr stable at 4.5 Feels well, no SOB. No LEE  12/13 0701 - 12/14 0700 In: 820 [P.O.:820] Out: 676 [Urine:675; Stool:1]  Filed Weights   01/29/16 0632 01/30/16 0510 01/31/16 0500  Weight: 110.5 kg (243 lb 8 oz) 111.7 kg (246 lb 3.2 oz) 109.7 kg (241 lb 14.4 oz)    Scheduled Meds: . atorvastatin  40 mg Oral QHS  . cholecalciferol  2,000 Units Oral Daily  . darbepoetin (ARANESP) injection - DIALYSIS  100 mcg Intravenous Q Sat-HD  . ferrous sulfate  325 mg Oral Q breakfast  . folic acid  1 mg Oral Daily  . furosemide  160 mg Oral BID  . insulin aspart  0-15 Units Subcutaneous TID WC  . insulin aspart  0-5 Units Subcutaneous QHS  . insulin aspart  5 Units Subcutaneous TID WC  . insulin glargine  35 Units Subcutaneous QHS  . ipratropium-albuterol  3 mL Nebulization BID  . levothyroxine  37.5 mcg Oral QAC breakfast  . mouth rinse  15 mL Mouth Rinse BID  . metoprolol  12.5 mg Oral BID  . pantoprazole  40 mg Oral Daily  . polyethylene glycol  17 g Oral Daily  . senna-docusate  2 tablet Oral QHS  . sertraline  100 mg Oral Daily  . sodium chloride flush  3 mL Intravenous Q12H  . thiamine  100 mg Oral Daily  . Warfarin - Pharmacist Dosing Inpatient   Does not apply q1800   Continuous Infusions: . sodium chloride 10 mL/hr at 01/28/16 2124   PRN Meds:.sodium chloride, acetaminophen, ondansetron (ZOFRAN) IV, oxyCODONE-acetaminophen, sodium chloride flush, THROMBI-PAD  Current Labs: reviewed    Physical Exam:  Blood pressure (!) 92/40, pulse 62, temperature 97.7 F (36.5 C), temperature source Oral, resp. rate 19, height 5\' 11"  (1.803 m), weight 109.7 kg (241 lb 14.4 oz), SpO2 97 %. Obese, nad,  RRR Bibasilar crackles, nl wob No LEE R IJ Temp HD cath in place  A 1. AoCKD4, has been dialysis dependent but some  suggestion of sufficient GFR 2. dCHF on BID lasix 160mg  PO 3. 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 HTN 5. DM2 6. 2HPTH 7. Anemia on ESA qSat  P 1. Cont to hold on dialysis given ongoing suggestion of sufficient renal function for volume status and holding GFR 2. Closely monitor; if stable for another 24h can begin plans for DC and outpt f/u; [facility] if HD not needed 3. Daily weights, Daily Renal Panel, Strict I/Os, Avoid nephrotoxins (NSAIDs, judicious IV Contrast)   Sabra Heckyan Zoii Florer MD 01/31/2016, 9:02 AM   Recent Labs Lab 01/27/16 0153  01/29/16 0411 01/30/16 0359 01/31/16 0458  NA 134*  < > 133* 134* 134*  K 3.8  < > 4.3 3.9 3.7  CL 98*  < > 98* 97* 96*  CO2 24  < > 23 24 24   GLUCOSE 141*  < > 265* 169* 171*  BUN 19  < > 36* 45* 52*  CREATININE 3.29*  < > 4.34* 4.52* 4.53*  CALCIUM 8.6*  < > 9.0 9.3 9.2  PHOS 3.2  --  4.7*  --  5.5*  < > = values in this interval not displayed.  Recent Labs Lab 01/26/16 0509 01/28/16 0615 01/31/16 0458  WBC 6.4 6.4 5.8  HGB 8.1* 8.7* 8.7*  HCT 26.8* 28.9* 28.9*  MCV 91.8 92.6 94.1  PLT 221 231 252            \

## 2016-01-31 NOTE — Progress Notes (Signed)
Internal Medicine Attending:   I saw and examined the patient. I reviewed the resident's note and I agree with the resident's findings and plan as documented in the resident's note.  Patient feels well with no new complaints. His urine output was 675 mL yesterday. His BMP remained stable. Nephrology follow-up and recommendations appreciated. Continue with Lasix for now. No need for dialysis at this time. His renal function continues to be stable for another 24 hours patient can be discharged home. Tunneled hemodialysis catheter will need to be removed prior to discharge if he does not require ongoing hemodialysis. We will continue to monitor BMP and urine output.

## 2016-01-31 NOTE — Progress Notes (Signed)
Pt resting w/ CPAP on, NAD noted, will continue to monitor.

## 2016-02-01 DIAGNOSIS — N170 Acute kidney failure with tubular necrosis: Secondary | ICD-10-CM

## 2016-02-01 DIAGNOSIS — N185 Chronic kidney disease, stage 5: Secondary | ICD-10-CM

## 2016-02-01 LAB — RENAL FUNCTION PANEL
ALBUMIN: 2.4 g/dL — AB (ref 3.5–5.0)
Anion gap: 13 (ref 5–15)
BUN: 55 mg/dL — AB (ref 6–20)
CHLORIDE: 96 mmol/L — AB (ref 101–111)
CO2: 25 mmol/L (ref 22–32)
CREATININE: 4.43 mg/dL — AB (ref 0.61–1.24)
Calcium: 9.3 mg/dL (ref 8.9–10.3)
GFR calc Af Amer: 14 mL/min — ABNORMAL LOW (ref >60)
GFR, EST NON AFRICAN AMERICAN: 12 mL/min — AB (ref >60)
GLUCOSE: 140 mg/dL — AB (ref 65–99)
PHOSPHORUS: 5.3 mg/dL — AB (ref 2.5–4.6)
Potassium: 3.6 mmol/L (ref 3.5–5.1)
Sodium: 134 mmol/L — ABNORMAL LOW (ref 135–145)

## 2016-02-01 LAB — PROTIME-INR
INR: 2.54
PROTHROMBIN TIME: 27.8 s — AB (ref 11.4–15.2)

## 2016-02-01 LAB — GLUCOSE, CAPILLARY
GLUCOSE-CAPILLARY: 147 mg/dL — AB (ref 65–99)
GLUCOSE-CAPILLARY: 124 mg/dL — AB (ref 65–99)

## 2016-02-01 MED ORDER — FUROSEMIDE 80 MG PO TABS: 160.0000 mg | ORAL_TABLET | Freq: Two times a day (BID) | ORAL | 1 refills | Status: AC

## 2016-02-01 MED ORDER — METOPROLOL TARTRATE 25 MG PO TABS: 12.5000 mg | ORAL_TABLET | Freq: Two times a day (BID) | ORAL | 1 refills | Status: AC

## 2016-02-01 NOTE — Progress Notes (Signed)
ANTICOAGULATION CONSULT NOTE - Follow up Consult  Pharmacy Consult for Coumadin Indication: atrial fibrillation   Allergies  Allergen Reactions  . No Known Allergies     Patient Measurements: Height: 5\' 11"  (180.3 cm) Weight: 244 lb 3.2 oz (110.8 kg) (Scale B) IBW/kg (Calculated) : 75.3   Vital Signs: Temp: 97.5 F (36.4 C) (12/15 0436) Temp Source: Oral (12/15 0436) BP: 112/46 (12/15 0800) Pulse Rate: 63 (12/15 0436)  Assessment: 70 yom on coumadin pta for afib. INR elevated on admit at 4.68 and patient was bleeding from IV site so vitamin k 5mg  was given. Once INR fell < 2, coumadin was resumed tried to bridge with heparin, but he still had significant bleeding from IV site so heparin was stopped. Coumadin was then held (last dose 12/5) pending AV graft placement. He is now s/p procedure and coumadin resumed 12/11.   INR this therapeutic today at 2.54, which is an increase from yesterday. Hemoglobin is low but stable, as patient has anemia of CKD. HD tunneled cath was removed today. No overt signs of bleeding noted.  Home dose: 2.5mg  on Tues/Wed/Thurs/Sat/Sun and no dose on Mon/Fri.   Will continue with patient's home dose.  Goal of Therapy:  INR 2-3  Monitor platelets by anticoagulation protocol: Yes   Plan:  No dose of warfarin tonight (consistant with PTA dosing) Daily INR Monitor for signs/symptoms of bleeding   Carylon PerchesMaggie Dwyne Hasegawa, PharmD Acute Care Pharmacy Resident  Pager: [phone] 02/01/2016

## 2016-02-01 NOTE — Progress Notes (Signed)
   Subjective: Patient was evaluated this morning on rounds. He states that his breathing is stable. He denies any shortness of breath or chest pain. He has no further complaints today. And is agreeable with discharge today.   Objective:  Vital signs in last 24 hours: Vitals:   01/31/16 1928 01/31/16 2111 02/01/16 0436 02/01/16 0800  BP: (!) 100/54  (!) 100/51 (!) 112/46  Pulse: 60  63   Resp: 19  19   Temp: 98.2 F (36.8 C)  97.5 F (36.4 C)   TempSrc: Oral  Oral   SpO2: 96% 96% 94%   Weight:   244 lb 3.2 oz (110.8 kg)   Height:       Physical Exam  Constitutional:  obese  Cardiovascular: Exam reveals no gallop and no friction rub.   No murmur heard. Irregularly Irregular  Pulmonary/Chest: Effort normal. No respiratory distress. He has no wheezes. He has no rales.  Faint bibasilar crackles  Musculoskeletal: He exhibits no edema.     Assessment/Plan:  Principal Problem:   Acute on chronic diastolic CHF (congestive heart failure) (HCC) Active Problems:   IDDM (insulin dependent diabetes mellitus) (HCC)   Obesity (BMI 30-39.9)   Cardiac pacemaker in situ   Hypothyroidism   History of complete heart block   Chronic anticoagulation   OSA (obstructive sleep apnea)   Pressure injury of skin   COPD (chronic obstructive pulmonary disease) (HCC)   Acute renal failure superimposed on chronic kidney disease (HCC)   Essential hypertension   Anemia of chronic disease  Acute on chronic diastolic CHF Patient is stating well on room air. Patient had 750cc of  urine output yesterday. He has been receivingLasix  IV 160mg  BID. Currently 244 lbs down from 270s on admission. - Continue Lasix 160 BID PO on discharge  Atrial fibrillation  S/P DCCV on 11/16.Patient is in a-fib with rate control.Heart rates are in the 60s -70s. INR is 2.59. CHAD-Vasc score 4.  - discharge on home dose warfarin: 2.5mg  on Tues/Wed/Thurs/Sat/Sun and no dose on Mon/Fri. 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 Patient had 750ccurine output yesterday. Patient does not need dialysis at this time.  His HD tunnel catheter was removed today. Will continue lasix at current dose started today (160BID) on discharge. Patient has follow up with nephrology 02/05/16 at 11:15am and vascular surgery in six weeks   Type 2 diabetes  Glucose 140this am. Currently on Lantus 35 units qhs, Novolog 5 units TIDand SSI-m.  -Can continue home dose on discharge  Anemia of chronic disease Hb 8.7on 12/11 and stable. 8.5 on admit,normal MCV. Baseline is around 9. - Continue home ferrous sulfate  HTN Stable.  100/51 continue reducedhome metoprolol to 12.5mg  BID on discharge and hold amlodipine.  Will need follow up with primary provider.  HLD -continue home atorvastatin  Hypothyroidism: Last TSH 8.141 on 11/9 -Continue Synthroid 25mcg   Dispo: Anticipated discharge today.   Camelia PhenesJessica Ratliff Nieves Barberi, DO 02/01/2016, 1:10 PM Pager: [phone]

## 2016-02-01 NOTE — Progress Notes (Signed)
[facility] H&P  CC:  Catheter removal   HPI:  This is a 70 y.o. male here for tunneled dialysis catheter removal.  This was placed on 01/28/16 by Dr. Imogene Burnhen. His renal function has improved and is no longer requiring dialysis. He also had a right first stage basilic vein transposition on 01/28/16    Past Medical History:  Diagnosis Date  . Cardiac pacemaker in situ 08/03/2012   Indications transient complete heart block Insertion 08/03/12 Dr. Graciela HusbandsKlein  LV lead Medtronic 5076 serial number [id number]  LA lead Medtronic 5076 serial number [id number] Medtronic MRI compatible pulse generator serial number [id number] H.       . Chronic diastolic heart failure (HCC)   . Chronic kidney disease (CKD), stage III (moderate) 08/02/2012  . 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 Hyperlipidemia   . Hypertensive heart disease   . Hypothyroidism   . 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 LBBB (left bundle branch block) 08/02/2012  . Obesity (BMI 30-39.9)     FH:  Non-Contributory  Social History   Social History  . Marital status: Married    Spouse name: N/A  . Number of children: N/A  . Years of education: N/A   Occupational History  . Not on file.   Social History Main Topics  . Smoking status: Former Games developermoker  . Smokeless tobacco: Never Used  . Alcohol use No  . Drug use: No  . Sexual activity: Not on file   Other Topics Concern  . Not on file   Social History Narrative  . No narrative on file    Allergies  Allergen Reactions  . No Known Allergies     Current Facility-Administered Medications  Medication Dose Route Frequency Provider Last Rate Last Dose  . 0.9 %  sodium chloride infusion  250 mL Intravenous PRN Lora PaulaJennifer T Krall, MD 0 mL/hr at 01/19/16 0530    . 0.9 %  sodium chloride infusion   Intravenous Continuous Fransisco HertzBrian L Chen, MD 10 mL/hr at 01/28/16 2124    . acetaminophen (TYLENOL) tablet 650 mg  650 mg Oral Q4H PRN  Lora PaulaJennifer T Krall, MD   650 mg at 01/21/16 2026  . atorvastatin (LIPITOR) tablet 40 mg  40 mg Oral QHS Lora PaulaJennifer T Krall, MD   40 mg at 01/31/16 2210  . cholecalciferol (VITAMIN D) tablet 2,000 Units  2,000 Units Oral Daily Lora PaulaJennifer T Krall, MD   2,000 Units at 01/31/16 1012  . Darbepoetin Alfa (ARANESP) injection 100 mcg  100 mcg Intravenous Q Sat-HD Delano Metzobert Schertz, MD   100 mcg at 01/26/16 1010  . ferrous sulfate tablet 325 mg  325 mg Oral Q breakfast Lora PaulaJennifer T Krall, MD   325 mg at 02/01/16 [phone]  . folic acid (FOLVITE) tablet 1 mg  1 mg Oral Daily Lora PaulaJennifer T Krall, MD   1 mg at 01/31/16 1012  . furosemide (LASIX) tablet 160 mg  160 mg Oral BID Arita Missyan B Sanford, MD   160 mg at 02/01/16 [phone]  . insulin aspart (novoLOG) injection 0-9 Units  0-9 Units Subcutaneous TID WC Valentino NoseNathan Boswell, MD   1 Units at 02/01/16 [phone]  . insulin aspart (novoLOG) injection 5 Units  5 Units Subcutaneous TID WC Earl LagosNischal Narendra, MD   5 Units at 02/01/16 0843  . insulin glargine (LANTUS) injection 35 Units  35 Units Subcutaneous QHS [facility] HospitalJessica Ratliff Hoffman, DO   35 Units at 01/30/16 2247  . ipratropium-albuterol (DUONEB) 0.5-2.5 (3) MG/3ML  nebulizer solution 3 mL  3 mL Nebulization Q4H PRN Nischal Narendra, MD      . levothyroxine (SYNTHROID, LEVOTHROID) tablet 37.5 mcg  37.5 mcg Oral QAC breakfast Nischal Narendra, MD   37.5 mcg at 02/01/16 0551  . MEDLINE mouth rinse  15 mL Mouth Rinse BID Delano Metzobert Schertz, MD   15 mL at 01/31/16 1013  . metoprolol tartrate (LOPRESSOR) tablet 12.5 mg  12.5 mg Oral BID Lora PaulaJennifer T Krall, MD   12.5 mg at 01/31/16 2210  . ondansetron (ZOFRAN) injection 4 mg  4 mg Intravenous Q6H PRN Lora PaulaJennifer T Krall, MD      . oxyCODONE-acetaminophen (PERCOCET/ROXICET) 5-325 MG per tablet 1-2 tablet  1-2 tablet Oral Q6H PRN Fransisco HertzBrian L Chen, MD      . pantoprazole (PROTONIX) EC tablet 40 mg  40 mg Oral Daily Lora PaulaJennifer T Krall, MD   40 mg at 01/31/16 1013  . polyethylene glycol (MIRALAX / GLYCOLAX) packet 17 g  17 g  Oral Daily Nischal Narendra, MD   17 g at 01/31/16 1012  . senna-docusate (Senokot-S) tablet 2 tablet  2 tablet Oral QHS Lora PaulaJennifer T Krall, MD   2 tablet at 01/30/16 2248  . sertraline (ZOLOFT) tablet 100 mg  100 mg Oral Daily Lora PaulaJennifer T Krall, MD   100 mg at 01/31/16 1012  . sodium chloride flush (NS) 0.9 % injection 3 mL  3 mL Intravenous Q12H Lora PaulaJennifer T Krall, MD   3 mL at 01/31/16 2200  . sodium chloride flush (NS) 0.9 % injection 3 mL  3 mL Intravenous PRN Lora PaulaJennifer T Krall, MD      . thiamine (VITAMIN B-1) tablet 100 mg  100 mg Oral Daily Lora PaulaJennifer T Krall, MD   100 mg at 01/31/16 1013  . THROMBI-PAD (THROMBI-PAD) 3"X3" pad 1 each  1 each Topical Once PRN Bethany Molt, DO      . Warfarin - Pharmacist Dosing Inpatient   Does not apply q1800 Emi HolesJennifer S Markle, RPH   1 each at 01/29/16 1754    ROS:  See HPI  PHYSICAL EXAM  Vitals:   02/01/16 0436 02/01/16 0800  BP: (!) 100/51 (!) 112/46  Pulse: 63   Resp: 19   Temp: 97.5 F (36.4 C)     Gen:  Well developed well nourished HEENT:  normocephalic Neck:  Right IJ TDC Lungs:  Non-labored Extremities:  Palpable thrill right arm fistula Skin:  No obvious rashes Neuro:  No obvious deficits  Impression: This is a 70 y.o. male here for right IJ tunneled dialysis catheter removal  Plan:  Removal of right IJ tunneled dialysis catheter  Maris BergerKimberly Keyara Ent, PA-C [facility] [phone] 02/01/2016 10:04 AM

## 2016-02-01 NOTE — Progress Notes (Signed)
  [facility] Catheter Removal Procedure Note  Diagnosis: ESRD  Plan:  Remove right IJ tunneled dialysis catheter  Consent signed:  Yes.   Time out completed:  Yes.   Coumadin:  Yes.   PT/INR (if applicable):  2.5  Procedure: 1.  Catheter area prepped with betadine.  2.  Sutures removed at exit site 3.  right catheter removed in its entirety with cuff in tact. 4.  Complications:  none 5. Tip of catheter sent for culture:  No.   Patient tolerated procedure well:  Yes.   Pressure held, no bleeding noted, dressing applied Instructions given to the pt regarding wound care and bleeding.  Maris BergerKimberly Kobe Ofallon, [place] JerseyPA-C [phone] 02/01/2016 10:08 AM

## 2016-02-01 NOTE — Progress Notes (Signed)
Internal Medicine Attending  Date: 02/01/2016  Patient name: Zachary CleverlyMichael D [name] Medical record number: [id number] Date of birth: [birth year] Age: 70 y.o. Gender: male  I saw and evaluated the patient. I reviewed the resident's note by Dr. Glenard Haringatliff-Hoffman and I agree with the resident's findings and plans as documented in her progress note.  When seen on rounds this morning [name] felt well without complaints. His urine output has been adequate to maintain his volume status without dialysis. He is therefore being discharged home with follow-up in nephrology and his primary care provider's office.

## 2016-02-01 NOTE — Progress Notes (Signed)
Patient  And family educated on all DC instructions and understands teaching and has no further questions.  Will continue to monitor for any changes.

## 2016-02-01 NOTE — Discharge Instructions (Signed)
St  Heart Failure Heart failure is a condition in which the heart has trouble pumping blood because it has become weak or stiff. This means that the heart does not pump blood efficiently for the body to work well. For some people with heart failure, fluid may back up into the lungs and there may be swelling (edema) in the lower legs. Heart failure is usually a long-term (chronic) condition. It is important for you to take good care of yourself and follow the treatment plan from your health care provider. What are the causes? This condition is caused by some health problems, including:  High blood pressure (hypertension). Hypertension causes the heart muscle to work harder than normal. High blood pressure eventually causes the heart to become stiff and weak.  Coronary artery disease (CAD). CAD is the buildup of cholesterol and fat (plaques) in the arteries of the heart.  Heart attack (myocardial infarction). Injured tissue, which is caused by the heart attack, does not contract as well and the heart's ability to pump blood is weakened.  Abnormal heart valves. When the heart valves do not open and close properly, the heart muscle must pump harder to keep the blood flowing.  Heart muscle disease (cardiomyopathy or myocarditis). Heart muscle disease is damage to the heart muscle from a variety of causes, such as drug or alcohol abuse, infections, or unknown causes. These can increase the risk of heart failure.  Lung disease. When the lungs do not work properly, the heart must work harder. What increases the risk? Risk of heart failure increases as a person ages. This condition is also more likely to develop in people who:  Are overweight.  Are male.  Smoke or chew tobacco.  Abuse alcohol or illegal drugs.  Have taken medicines that can damage the heart, such as chemotherapy drugs.  Have diabetes.  High blood sugar (glucose) is associated with high fat (lipid) levels in the  blood.  Diabetes can also damage tiny blood vessels that carry nutrients to the heart muscle.  Have abnormal heart rhythms.  Have thyroid problems.  Have low blood counts (anemia). What are the signs or symptoms? Symptoms of this condition include:  Shortness of breath with activity, such as when climbing stairs.  Persistent cough.  Swelling of the feet, ankles, legs, or abdomen.  Unexplained weight gain.  Difficulty breathing when lying flat (orthopnea).  Waking from sleep because of the need to sit up and get more air.  Rapid heartbeat.  Fatigue and loss of energy.  Feeling light-headed, dizzy, or close to fainting.  Loss of appetite.  Nausea.  Increased urination during the night (nocturia).  Confusion. How is this diagnosed? This condition is diagnosed based on:  Medical history, symptoms, and a physical exam.  Diagnostic tests, which may include:  Echocardiogram.  Electrocardiogram (ECG).  Chest X-ray.  Blood tests.  Exercise stress test.  Radionuclide scans.  Cardiac catheterization and angiogram. How is this treated? Treatment for this condition is aimed at managing the symptoms of heart failure. Medicines, behavioral changes, or other treatments may be necessary to treat heart failure. Medicines  These may include:  Angiotensin-converting enzyme (ACE) inhibitors. This type of medicine blocks the effects of a blood protein called angiotensin-converting enzyme. ACE inhibitors relax (dilate) the blood vessels and help to lower blood pressure.  Angiotensin receptor blockers (ARBs). This type of medicine blocks the actions of a blood protein called angiotensin. ARBs dilate the blood vessels and help to lower blood pressure.  Water pills (  diuretics). Diuretics cause the kidneys to remove salt and water from the blood. The extra fluid is removed through urination, leaving a lower volume of blood that the heart has to pump.  Beta blockers. These  improve heart muscle strength and they prevent the heart from beating too quickly.  Digoxin. This increases the force of the heartbeat. Healthy behavior changes  These may include:  Reaching and maintaining a healthy weight.  Stopping smoking or chewing tobacco.  Eating heart-healthy foods.  Limiting or avoiding alcohol.  Stopping use of street drugs (illegal drugs).  Physical activity. Other treatments  These may include:  Surgery to open blocked coronary arteries or repair damaged heart valves.  Placement of a biventricular pacemaker to improve heart muscle function (cardiac resynchronization therapy). This device paces both the right ventricle and left ventricle.  Placement of a device to treat serious abnormal heart rhythms (implantable cardioverter defibrillator, or ICD).  Placement of a device to improve the pumping ability of the heart (left ventricular assist device, or LVAD).  Heart transplant. This can cure heart failure, and it is considered for certain patients who do not improve with other therapies. Follow these instructions at home: Medicines  Take over-the-counter and prescription medicines only as told by your health care provider. Medicines are important in reducing the workload of your heart, slowing the progression of heart failure, and improving your symptoms.  Do not stop taking your medicine unless your health care provider told you to do that.  Do not skip any dose of medicine.  Refill your prescriptions before you run out of medicine. You need your medicines every day. Eating and drinking  Eat heart-healthy foods. Talk with a dietitian to make an eating plan that is right for you.  Choose foods that contain no trans fat and are low in saturated fat and cholesterol. Healthy choices include fresh or frozen fruits and vegetables, fish, lean meats, legumes, fat-free or low-fat dairy products, and whole-grain or high-fiber foods.  Limit salt (sodium)  if directed by your health care provider. Sodium restriction may reduce symptoms of heart failure. Ask a dietitian to recommend heart-healthy seasonings.  Use healthy cooking methods instead of frying. Healthy methods include roasting, grilling, broiling, baking, poaching, steaming, and stir-frying.  Limit your fluid intake if directed by your health care provider. Fluid restriction may reduce symptoms of heart failure. Lifestyle  Stop smoking or using chewing tobacco. Nicotine and tobacco can damage your heart and your blood vessels. Do not use nicotine gum or patches before talking to your health care provider.  Limit alcohol intake to no more than 1 drink per day for non-pregnant women and 2 drinks per day for men. One drink equals 12 oz of beer, 5 oz of wine, or 1 oz of hard liquor.  Drinking more than that is harmful to your heart. Tell your health care provider if you drink alcohol several times a week.  Talk with your health care provider about whether any level of alcohol use is safe for you.  If your heart has already been damaged by alcohol or you have severe heart failure, drinking alcohol should be stopped completely.  Stop use of illegal drugs.  Lose weight if directed by your health care provider. Weight loss may reduce symptoms of heart failure.  Do moderate physical activity if directed by your health care provider. People who are elderly and people with severe heart failure should consult with a health care provider for physical activity recommendations. Monitor important information  Weigh yourself every day. Keeping track of your weight daily helps you to notice excess fluid sooner.  Weigh yourself every morning after you urinate and before you eat breakfast.  Wear the same amount of clothing each time you weigh yourself.  Record your daily weight. Provide your health care provider with your weight record.  Monitor and record your blood pressure as told by your  health care provider.  Check your pulse as told by your health care provider. Dealing with extreme temperatures  If the weather is extremely hot:  Avoid vigorous physical activity.  Use air conditioning or fans or seek a cooler location.  Avoid caffeine and alcohol.  Wear loose-fitting, lightweight, and light-colored clothing.  If the weather is extremely cold:  Avoid vigorous physical activity.  Layer your clothes.  Wear mittens or gloves, a hat, and a scarf when you go outside.  Avoid alcohol. General instructions  Manage other health conditions such as hypertension, diabetes, thyroid disease, or abnormal heart rhythms as told by your health care provider.  Learn to manage stress. If you need help to do this, ask your health care provider.  Plan rest periods when fatigued.  Get ongoing education and support as needed.  Participate in or seek rehabilitation as needed to maintain or improve independence and quality of life.  Stay up to date with immunizations. Keeping current on pneumococcal and influenza immunizations is especially important to prevent respiratory infections.  Keep all follow-up visits as told by your health care provider. This is important. Contact a health care provider if:  You have a rapid weight gain.  You have increasing shortness of breath that is unusual for you.  You are unable to participate in your usual physical activities.  You tire easily.  You cough more than normal, especially with physical activity.  You have any swelling or more swelling in areas such as your hands, feet, ankles, or abdomen.  You are unable to sleep because it is hard to breathe.  You feel like your heart is beating quickly (palpitations).  You become dizzy or light-headed when you stand up. Get help right away if:  You have difficulty breathing.  You notice or your family notices a change in your awareness, such as having trouble staying awake or  having difficulty with concentration.  You have pain or discomfort in your chest.  You have an episode of fainting (syncope). This information is not intended to replace advice given to you by your health care provider. Make sure you discuss any questions you have with your health care provider. Document Released: 02/03/2005 Document Revised: 10/09/2015 Document Reviewed: 08/29/2015 Elsevier Interactive Patient Education  2017 [employer]    01/29/2016 [name] [id number] 1945/05/29  Surgeon(s): Fransisco Hertz, MD  Procedure(s): Right first stage basilic vein transposition (brachiobasilic arteriovenous fistula) placement INSERTION OF DIALYSIS CATHETER RIGHT INTERNAL JUGULAR  x Do not stick fistula for 12 weeks

## 2016-02-01 NOTE — Progress Notes (Signed)
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 No LEE Weight stable around 110-111kg Has scal at home, instructed for AM weights, daily BP in L arm.    12/14 0701 - 12/15 0700 In: 580 [P.O.:580] Out: 950 [Urine:950]  Filed Weights   01/30/16 0510 01/31/16 0500 02/01/16 0436  Weight: 111.7 kg (246 lb 3.2 oz) 109.7 kg (241 lb 14.4 oz) 110.8 kg (244 lb 3.2 oz)    Scheduled Meds: . atorvastatin  40 mg Oral QHS  . cholecalciferol  2,000 Units Oral Daily  . darbepoetin (ARANESP) injection - DIALYSIS  100 mcg Intravenous Q Sat-HD  . ferrous sulfate  325 mg Oral Q breakfast  . folic acid  1 mg Oral Daily  . furosemide  160 mg Oral BID  . insulin aspart  0-9 Units Subcutaneous TID WC  . insulin aspart  5 Units Subcutaneous TID WC  . insulin glargine  35 Units Subcutaneous QHS  . levothyroxine  37.5 mcg Oral QAC breakfast  . mouth rinse  15 mL Mouth Rinse BID  . metoprolol  12.5 mg Oral BID  . pantoprazole  40 mg Oral Daily  . polyethylene glycol  17 g Oral Daily  . senna-docusate  2 tablet Oral QHS  . sertraline  100 mg Oral Daily  . sodium chloride flush  3 mL Intravenous Q12H  . thiamine  100 mg Oral Daily  . Warfarin - Pharmacist Dosing Inpatient   Does not apply q1800   Continuous Infusions: . sodium chloride 10 mL/hr at 01/28/16 2124   PRN Meds:.sodium chloride, acetaminophen, ipratropium-albuterol, ondansetron (ZOFRAN) IV, oxyCODONE-acetaminophen, sodium chloride flush, THROMBI-PAD  Current Labs: reviewed    Physical Exam:  Blood pressure (!) 100/51, pulse 63, temperature 97.5 F (36.4 C), temperature source Oral, resp. rate 19, height 5\' 11"  (1.803 m), weight 110.8 kg (244 lb 3.2 oz), SpO2 94 %. Obese, nad,  RRR Diminished in bases, nl wob No LEE R IJ Temp HD cath in place  A 1. AoCKD4, was been dialysis dependent but appears to have  adequate GFR to maintain euvolemia 2. dCHF on BID lasix 160mg  PO 3. 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 HTN 5. DM2 6. 2HPTH 7. Anemia on ESA qSat  P 1. Doesn't need dialysis; has renal function for volume status and holding GFR 2. VVS will remove TDC today; appreciate 3. OK for DC. Has appt with me 02/05/16 at 11:15am at [address]: [phone] 4. Cont diuretis as currently dosed   Sabra Heckyan Abdiel Blackerby MD 02/01/2016, 8:38 AM   Recent Labs Lab 01/29/16 0411 01/30/16 0359 01/31/16 0458 02/01/16 0355  NA 133* 134* 134* 134*  K 4.3 3.9 3.7 3.6  CL 98* 97* 96* 96*  CO2 23 24 24 25   GLUCOSE 265* 169* 171* 140*  BUN 36* 45* 52* 55*  CREATININE 4.34* 4.52* 4.53* 4.43*  CALCIUM 9.0 9.3 9.2 9.3  PHOS 4.7*  --  5.5* 5.3*    Recent Labs Lab 01/26/16 0509 01/28/16 0615 01/31/16 0458  WBC 6.4 6.4 5.8  HGB 8.1* 8.7* 8.7*  HCT 26.8* 28.9* 28.9*  MCV 91.8 92.6 94.1  PLT 221 231 252            \

## 2016-02-06 ENCOUNTER — Telehealth: Payer: Self-pay | Admitting: Internal Medicine

## 2016-02-06 NOTE — Discharge Summary (Signed)
Name: Zachary CleverlyMichael D [name] MRN: [id number] DOB: September 06, 1945 70 y.o. PCP: Annamaria Bootsenu Bale, MD  Date of Admission: 01/15/2016  8:53 AM Date of Discharge: 02/01/2016 Attending Physician: Doneen PoissonLawrence Klima MD  Discharge Diagnosis: 1. Acute on chronic diastolic congestive heart failure 2. Acute renal failure superimposed on chronic kidney disease  Principal Problem:   Acute on chronic diastolic CHF (congestive heart failure) (HCC) Active Problems:   IDDM (insulin dependent diabetes mellitus) (HCC)   Obesity (BMI 30-39.9)   Cardiac pacemaker in situ   Hypothyroidism   History of complete heart block   Chronic anticoagulation   OSA (obstructive sleep apnea)   Pressure injury of skin   COPD (chronic obstructive pulmonary disease) (HCC)   Acute renal failure superimposed on chronic kidney disease (HCC)   Essential hypertension   Anemia of chronic disease   Acute renal failure with acute tubular necrosis superimposed on stage 5 chronic kidney disease, not on chronic dialysis [facility](HCC)   Discharge Medications: Allergies as of 02/01/2016      Reactions   No Known Allergies       Medication List    STOP taking these medications   amiodarone 200 MG tablet Commonly known as:  PACERONE   amLODipine 5 MG tablet Commonly known as:  NORVASC     TAKE these medications   albuterol-ipratropium 18-103 MCG/ACT inhaler Commonly known as:  COMBIVENT Inhale 1 puff into the lungs 4 (four) times daily.   artificial tears Oint ophthalmic ointment Place 1 application into both eyes every evening.   atorvastatin 80 MG tablet Commonly known as:  LIPITOR Take 40 mg by mouth at bedtime.   ferrous sulfate 325 (65 FE) MG tablet Take 325 mg by mouth 2 (two) times daily.   fish oil-omega-3 fatty acids 1000 MG capsule Take 1 g by mouth 2 (two) times daily.   folic acid 1 MG tablet Commonly known as:  FOLVITE Take 1 mg by mouth daily.   furosemide 80 MG tablet Commonly known as:  LASIX Take 2 tablets  (160 mg total) by mouth 2 (two) times daily. What changed:  medication strength  how much to take  how to take this  when to take this   insulin aspart 100 UNIT/ML injection Commonly known as:  novoLOG Inject 20 Units into the skin 3 (three) times daily before meals.   insulin glargine 100 UNIT/ML injection Commonly known as:  LANTUS Inject 52 Units into the skin at bedtime. Notes to patient:  Always check blood sugars before giving   ketotifen 0.025 % ophthalmic solution Commonly known as:  ZADITOR Place 1 drop into both eyes 2 (two) times daily as needed.   levothyroxine 25 MCG tablet Commonly known as:  SYNTHROID, LEVOTHROID Take 25 mcg by mouth every morning. Saturday and Sunday takes 2 tablets Notes to patient:  Take on empty stomach   metoprolol tartrate 25 MG tablet Commonly known as:  LOPRESSOR Take 0.5 tablets (12.5 mg total) by mouth 2 (two) times daily. What changed:  medication strength  how much to take   mirtazapine 15 MG tablet Commonly known as:  REMERON Take 15 mg by mouth at bedtime.   omeprazole 20 MG capsule Commonly known as:  PRILOSEC Take 20 mg by mouth daily.   pentoxifylline 400 MG CR tablet Commonly known as:  TRENTAL Take 400 mg by mouth 2 (two) times daily.   sertraline 100 MG tablet Commonly known as:  ZOLOFT Take 100 mg by mouth daily.   thiamine 100  MG tablet Commonly known as:  VITAMIN B-1 Take 100 mg by mouth daily.   Vitamin D 2000 units Caps Take 2,000 Units by mouth daily.   warfarin 2.5 MG tablet Commonly known as:  COUMADIN Take 2.5 mg by mouth one time only at 6 PM. Tuesday, Wednesday,thursday,saturday,sunday       Disposition and follow-up:   Mr.[name] was discharged from [facility] in stable condition.  At the hospital follow up visit please address:  1.  Compliance with Lasix  2.  Labs / imaging needed at time of follow-up: none  3.  Pending labs/ test needing follow-up:  none  Follow-up Appointments: Follow-up Information    Sherryl Manges, MD Follow up on 03/13/2016.   Specialty:  Cardiology Why:  at 3:15pm for follow up Contact information: [address] [place] [address] [phone]        Leonides Sake, MD Follow up in 6 week(s).   Specialties:  Vascular Surgery, Cardiology Why:  Office will call you to arrange your appt (sent) Contact information: [address] [place] [address] [phone]        Annamaria Boots, MD Follow up.   Specialty:  Internal Medicine       [facility] Follow up.   Why:  They will do your home health care at your home Contact information: [address] [place] [address] [phone]        Arita Miss, MD. Go on 02/05/2016.   Specialty:  Nephrology Why:  @11 :15am Contact information: [address] [place] [address] [phone]           Hospital Course by problem list: Principal Problem:   Acute on chronic diastolic CHF (congestive heart failure) (HCC) Active Problems:   IDDM (insulin dependent diabetes mellitus) (HCC)   Obesity (BMI 30-39.9)   Cardiac pacemaker in situ   Hypothyroidism   History of complete heart block   Chronic anticoagulation   OSA (obstructive sleep apnea)   Pressure injury of skin   COPD (chronic obstructive pulmonary disease) (HCC)   Acute renal failure superimposed on chronic kidney disease (HCC)   Essential hypertension   Anemia of chronic disease   Acute renal failure with acute tubular necrosis superimposed on stage 5 chronic kidney disease, not on chronic dialysis (HCC)   1. Acute on chronic diastolic congestive heart failure Patient presented with shortness of breath, 2+ pitting edema in lower extremities bilaterally and mild edema in his upper extremities bilaterally. He was aggressively diuresed with 160 mg IV Lasix twice a day. However, due to worsening renal function he was unable to properly diuresis.  Patient had to have temporary hemodialysis while inpatient.  Patient was originally on BiPAP in the beginning of admission due to shortness of breath.  With several sessions of hemodialysis patient was able to be weaned off BiPAP and later off supplemental oxygen altogether.  He was discharged with 160 mg Lasix by mouth twice a day and told to follow-up with nephrology upon discharge.  His discharge weight was ~244lb, down from 270s on admission.  2. Paroxysmal atrial fibrillation Prior to this admission patient had been cardioverted into sinus rhythm on 11/16 however, did not remain in sinus rhythm. During this admission amiodarone was increased to help with rhythm control as rates were well controlled in the 60s.  After further consideration amiodarone was discontinued as the side effects did not outweigh the benefits provided.  Patient was told to  follow-up with cardiology upon discharge.  3. Acute renal failure superimposed on chronic kidney disease Patient has stage IV chronic kidney disease. During admission his creatinine had worsened and despite aggressive IV Lasix was not able to diurese well. Patient had temporary hemodialysis to help with volume overload. He had a fistula placed in his right arm during his admission for potential hemodialysis in the future.  He also had a tunnel catheter placed.  His warfarin was held for the procedure and prior to discharge his INR was in therapeutic range.  After several sessions of hemodialysis patient was again treated aggressively with IV Lasix and was eventually able to have moderate urine output. He went 4 days without dialysis and his renal function was stable without worsening. As a result the tunnel catheter was removed prior to discharge.  Patient does not need hemodialysis at this time and was discharged home with home health and told to follow-up with nephrology outpatient.  4. Hypertension Patient's blood pressures became low during admission in the  100s/60s.  Patient did not complain of lightheadedness, dizziness, shortness of breath or chest pain when his blood pressures were low.  He was continued on metoprolol however reduced to 12.5 twice a day. Amlodipine was held.  On discharge patient was instructed to take metoprolol 12.5 twice a day and discontinue amlodipine until follow-up with primary care provider.  5. Type 2 diabetes  Blood glucose was not well controlled during admission.  Insulin was eventually increased to Lantus 35 units qhs, Novolog 5 units TID and SSI-m and this regimen allowed for better control.    6. Hyperlipidemia Continued home atorvastatin  7. Hypothyroidism Last TSH 8.141 on 11/9.  Continued home Synthroid 25mcg   Discharge Vitals:   BP (!) 104/35 (BP Location: Left Wrist)   Pulse (!) 59   Temp 97.7 F (36.5 C) (Oral)   Resp 18   Ht 5\' 11"  (1.803 m)   Wt 244 lb 3.2 oz (110.8 kg) Comment: Scale B  SpO2 99%   BMI 34.06 kg/m   Pertinent Labs, Studies, and Procedures:  fistula  Discharge Instructions: Discharge Instructions    Diet - low sodium heart healthy    Complete by:  As directed    Discharge instructions    Complete by:  As directed    [name],  Please follow up with nephrology and your primary care doctor at discharge. You will need to start taking Lasix 160mg  twice a day You may resume your home warfarin and Insulin   Increase activity slowly    Complete by:  As directed       Signed: Camelia PhenesJessica Ratliff Markeis Allman, DO 02/06/2016, 1:59 PM   Pager: [phone]

## 2016-02-06 NOTE — Telephone Encounter (Signed)
I left a message for Wynona CanesChristine that the patient's home health ordering physician should be his PCP. I asked that she call back with any questions.

## 2016-02-06 NOTE — Telephone Encounter (Signed)
[phone] Patient home health nurse needs to know if Dr. Graciela HusbandsKlein will be patient ordering physician. Please advise

## 2016-03-06 ENCOUNTER — Emergency Department (HOSPITAL_COMMUNITY): Payer: Non-veteran care

## 2016-03-06 ENCOUNTER — Encounter (HOSPITAL_COMMUNITY): Payer: Self-pay | Admitting: Emergency Medicine

## 2016-03-06 ENCOUNTER — Inpatient Hospital Stay (HOSPITAL_COMMUNITY): Payer: Non-veteran care

## 2016-03-06 ENCOUNTER — Inpatient Hospital Stay (HOSPITAL_COMMUNITY)
Admission: EM | Admit: 2016-03-06 | Discharge: 2016-03-14 | DRG: 264 | Disposition: A | Discharge status: Home / Self Care | Payer: Non-veteran care | Attending: Internal Medicine | Admitting: Internal Medicine

## 2016-03-06 ENCOUNTER — Telehealth: Payer: Medicare HMO | Admitting: Family

## 2016-03-06 DIAGNOSIS — J111 Influenza due to unidentified influenza virus with other respiratory manifestations: Secondary | ICD-10-CM

## 2016-03-06 DIAGNOSIS — I110 Hypertensive heart disease with heart failure: Secondary | ICD-10-CM | POA: Diagnosis not present

## 2016-03-06 DIAGNOSIS — I5043 Acute on chronic combined systolic (congestive) and diastolic (congestive) heart failure: Secondary | ICD-10-CM | POA: Diagnosis present

## 2016-03-06 DIAGNOSIS — E669 Obesity, unspecified: Secondary | ICD-10-CM | POA: Diagnosis present

## 2016-03-06 DIAGNOSIS — I447 Left bundle-branch block, unspecified: Secondary | ICD-10-CM | POA: Diagnosis present

## 2016-03-06 DIAGNOSIS — J9621 Acute and chronic respiratory failure with hypoxia: Secondary | ICD-10-CM | POA: Diagnosis not present

## 2016-03-06 DIAGNOSIS — I509 Heart failure, unspecified: Secondary | ICD-10-CM

## 2016-03-06 DIAGNOSIS — Z794 Long term (current) use of insulin: Secondary | ICD-10-CM

## 2016-03-06 DIAGNOSIS — J1000 Influenza due to other identified influenza virus with unspecified type of pneumonia: Secondary | ICD-10-CM | POA: Diagnosis present

## 2016-03-06 DIAGNOSIS — J9600 Acute respiratory failure, unspecified whether with hypoxia or hypercapnia: Secondary | ICD-10-CM | POA: Diagnosis not present

## 2016-03-06 DIAGNOSIS — T380X5A Adverse effect of glucocorticoids and synthetic analogues, initial encounter: Secondary | ICD-10-CM | POA: Diagnosis not present

## 2016-03-06 DIAGNOSIS — I5033 Acute on chronic diastolic (congestive) heart failure: Secondary | ICD-10-CM | POA: Diagnosis present

## 2016-03-06 DIAGNOSIS — A419 Sepsis, unspecified organism: Secondary | ICD-10-CM | POA: Diagnosis not present

## 2016-03-06 DIAGNOSIS — I132 Hypertensive heart and chronic kidney disease with heart failure and with stage 5 chronic kidney disease, or end stage renal disease: Principal | ICD-10-CM | POA: Diagnosis present

## 2016-03-06 DIAGNOSIS — N185 Chronic kidney disease, stage 5: Secondary | ICD-10-CM | POA: Diagnosis present

## 2016-03-06 DIAGNOSIS — Z6833 Body mass index (BMI) 33.0-33.9, adult: Secondary | ICD-10-CM | POA: Diagnosis not present

## 2016-03-06 DIAGNOSIS — E119 Type 2 diabetes mellitus without complications: Secondary | ICD-10-CM | POA: Diagnosis not present

## 2016-03-06 DIAGNOSIS — Z950 Presence of cardiac pacemaker: Secondary | ICD-10-CM | POA: Diagnosis not present

## 2016-03-06 DIAGNOSIS — N179 Acute kidney failure, unspecified: Secondary | ICD-10-CM | POA: Diagnosis not present

## 2016-03-06 DIAGNOSIS — E785 Hyperlipidemia, unspecified: Secondary | ICD-10-CM | POA: Diagnosis present

## 2016-03-06 DIAGNOSIS — IMO0001 Reserved for inherently not codable concepts without codable children: Secondary | ICD-10-CM

## 2016-03-06 DIAGNOSIS — R6521 Severe sepsis with septic shock: Secondary | ICD-10-CM | POA: Diagnosis not present

## 2016-03-06 DIAGNOSIS — E875 Hyperkalemia: Secondary | ICD-10-CM | POA: Diagnosis not present

## 2016-03-06 DIAGNOSIS — D6832 Hemorrhagic disorder due to extrinsic circulating anticoagulants: Secondary | ICD-10-CM | POA: Diagnosis present

## 2016-03-06 DIAGNOSIS — R319 Hematuria, unspecified: Secondary | ICD-10-CM | POA: Diagnosis present

## 2016-03-06 DIAGNOSIS — E039 Hypothyroidism, unspecified: Secondary | ICD-10-CM | POA: Diagnosis present

## 2016-03-06 DIAGNOSIS — E1122 Type 2 diabetes mellitus with diabetic chronic kidney disease: Secondary | ICD-10-CM | POA: Diagnosis present

## 2016-03-06 DIAGNOSIS — Z01818 Encounter for other preprocedural examination: Secondary | ICD-10-CM

## 2016-03-06 DIAGNOSIS — I480 Paroxysmal atrial fibrillation: Secondary | ICD-10-CM | POA: Diagnosis present

## 2016-03-06 DIAGNOSIS — R0602 Shortness of breath: Secondary | ICD-10-CM | POA: Diagnosis present

## 2016-03-06 DIAGNOSIS — Z7901 Long term (current) use of anticoagulants: Secondary | ICD-10-CM | POA: Diagnosis not present

## 2016-03-06 DIAGNOSIS — G9340 Encephalopathy, unspecified: Secondary | ICD-10-CM | POA: Diagnosis present

## 2016-03-06 DIAGNOSIS — J9690 Respiratory failure, unspecified, unspecified whether with hypoxia or hypercapnia: Secondary | ICD-10-CM

## 2016-03-06 DIAGNOSIS — I119 Hypertensive heart disease without heart failure: Secondary | ICD-10-CM | POA: Diagnosis present

## 2016-03-06 DIAGNOSIS — J80 Acute respiratory distress syndrome: Secondary | ICD-10-CM | POA: Diagnosis present

## 2016-03-06 DIAGNOSIS — N186 End stage renal disease: Secondary | ICD-10-CM | POA: Diagnosis not present

## 2016-03-06 DIAGNOSIS — J9601 Acute respiratory failure with hypoxia: Secondary | ICD-10-CM | POA: Diagnosis not present

## 2016-03-06 DIAGNOSIS — Z87891 Personal history of nicotine dependence: Secondary | ICD-10-CM

## 2016-03-06 DIAGNOSIS — E872 Acidosis: Secondary | ICD-10-CM | POA: Diagnosis not present

## 2016-03-06 DIAGNOSIS — G4733 Obstructive sleep apnea (adult) (pediatric): Secondary | ICD-10-CM | POA: Diagnosis present

## 2016-03-06 DIAGNOSIS — N189 Chronic kidney disease, unspecified: Secondary | ICD-10-CM | POA: Diagnosis not present

## 2016-03-06 DIAGNOSIS — J441 Chronic obstructive pulmonary disease with (acute) exacerbation: Secondary | ICD-10-CM | POA: Diagnosis present

## 2016-03-06 DIAGNOSIS — Z66 Do not resuscitate: Secondary | ICD-10-CM | POA: Diagnosis not present

## 2016-03-06 DIAGNOSIS — Z452 Encounter for adjustment and management of vascular access device: Secondary | ICD-10-CM

## 2016-03-06 DIAGNOSIS — E1165 Type 2 diabetes mellitus with hyperglycemia: Secondary | ICD-10-CM | POA: Diagnosis present

## 2016-03-06 DIAGNOSIS — N289 Disorder of kidney and ureter, unspecified: Secondary | ICD-10-CM

## 2016-03-06 DIAGNOSIS — I5023 Acute on chronic systolic (congestive) heart failure: Secondary | ICD-10-CM

## 2016-03-06 DIAGNOSIS — J440 Chronic obstructive pulmonary disease with acute lower respiratory infection: Secondary | ICD-10-CM | POA: Diagnosis present

## 2016-03-06 DIAGNOSIS — J9620 Acute and chronic respiratory failure, unspecified whether with hypoxia or hypercapnia: Secondary | ICD-10-CM | POA: Diagnosis not present

## 2016-03-06 DIAGNOSIS — I4819 Other persistent atrial fibrillation: Secondary | ICD-10-CM | POA: Diagnosis present

## 2016-03-06 DIAGNOSIS — R34 Anuria and oliguria: Secondary | ICD-10-CM | POA: Diagnosis not present

## 2016-03-06 DIAGNOSIS — I481 Persistent atrial fibrillation: Secondary | ICD-10-CM | POA: Diagnosis present

## 2016-03-06 DIAGNOSIS — T45511A Poisoning by anticoagulants, accidental (unintentional), initial encounter: Secondary | ICD-10-CM

## 2016-03-06 DIAGNOSIS — E876 Hypokalemia: Secondary | ICD-10-CM | POA: Diagnosis not present

## 2016-03-06 DIAGNOSIS — R0902 Hypoxemia: Secondary | ICD-10-CM

## 2016-03-06 DIAGNOSIS — D649 Anemia, unspecified: Secondary | ICD-10-CM | POA: Diagnosis present

## 2016-03-06 DIAGNOSIS — Z9911 Dependence on respirator [ventilator] status: Secondary | ICD-10-CM

## 2016-03-06 HISTORY — DX: Chronic obstructive pulmonary disease, unspecified: J44.9

## 2016-03-06 LAB — CBC WITH DIFFERENTIAL/PLATELET
BASOS PCT: 0 %
Basophils Absolute: 0 10*3/uL (ref 0.0–0.1)
Eosinophils Absolute: 0 10*3/uL (ref 0.0–0.7)
Eosinophils Relative: 0 %
HEMATOCRIT: 29.5 % — AB (ref 39.0–52.0)
HEMOGLOBIN: 8.6 g/dL — AB (ref 13.0–17.0)
LYMPHS ABS: 1.5 10*3/uL (ref 0.7–4.0)
LYMPHS PCT: 20 %
MCH: 28.5 pg (ref 26.0–34.0)
MCHC: 29.2 g/dL — AB (ref 30.0–36.0)
MCV: 97.7 fL (ref 78.0–100.0)
MONO ABS: 0.4 10*3/uL (ref 0.1–1.0)
MONOS PCT: 5 %
NEUTROS ABS: 5.6 10*3/uL (ref 1.7–7.7)
NEUTROS PCT: 75 %
Platelets: 181 10*3/uL (ref 150–400)
RBC: 3.02 MIL/uL — ABNORMAL LOW (ref 4.22–5.81)
RDW: 19.1 % — AB (ref 11.5–15.5)
WBC: 7.6 10*3/uL (ref 4.0–10.5)

## 2016-03-06 LAB — I-STAT CHEM 8, ED
BUN: 91 mg/dL — AB (ref 6–20)
CREATININE: 3.6 mg/dL — AB (ref 0.61–1.24)
Calcium, Ion: 1.2 mmol/L (ref 1.15–1.40)
Chloride: 103 mmol/L (ref 101–111)
GLUCOSE: 88 mg/dL (ref 65–99)
HEMATOCRIT: 29 % — AB (ref 39.0–52.0)
Hemoglobin: 9.9 g/dL — ABNORMAL LOW (ref 13.0–17.0)
Potassium: 3.6 mmol/L (ref 3.5–5.1)
Sodium: 143 mmol/L (ref 135–145)
TCO2: 27 mmol/L (ref 0–100)

## 2016-03-06 LAB — HEPATIC FUNCTION PANEL
ALBUMIN: 2.8 g/dL — AB (ref 3.5–5.0)
ALK PHOS: 90 U/L (ref 38–126)
ALT: 63 U/L (ref 17–63)
AST: 40 U/L (ref 15–41)
BILIRUBIN INDIRECT: 0.5 mg/dL (ref 0.3–0.9)
BILIRUBIN TOTAL: 0.8 mg/dL (ref 0.3–1.2)
Bilirubin, Direct: 0.3 mg/dL (ref 0.1–0.5)
TOTAL PROTEIN: 7.8 g/dL (ref 6.5–8.1)

## 2016-03-06 LAB — BLOOD GAS, ARTERIAL
ACID-BASE EXCESS: 1.2 mmol/L (ref 0.0–2.0)
ACID-BASE EXCESS: 0.3 mmol/L (ref 0.0–2.0)
BICARBONATE: 24.4 mmol/L (ref 20.0–28.0)
Bicarbonate: 24.7 mmol/L (ref 20.0–28.0)
DRAWN BY: 277331
DRAWN BY: 277331
Delivery systems: POSITIVE
EXPIRATORY PAP: 6
Expiratory PAP: 6
FIO2: 0.6
FIO2: 0.5
Inspiratory PAP: 14
Inspiratory PAP: 14
Mode: POSITIVE
O2 SAT: 99.2 %
O2 Saturation: 45.7 %
PCO2 ART: 47.6 mmHg (ref 32.0–48.0)
PH ART: 7.357 (ref 7.350–7.450)
PH ART: 7.404 (ref 7.350–7.450)
PO2 ART: 31.9 mmHg — AB (ref 83.0–108.0)
PO2 ART: 155 mmHg — AB (ref 83.0–108.0)
Patient temperature: 37
Patient temperature: 37
RATE: 12 resp/min
RATE: 12 resp/min
pCO2 arterial: 40 mmHg (ref 32.0–48.0)

## 2016-03-06 LAB — URINALYSIS, ROUTINE W REFLEX MICROSCOPIC
Bacteria, UA: NONE SEEN
Bilirubin Urine: NEGATIVE
GLUCOSE, UA: NEGATIVE mg/dL
Ketones, ur: NEGATIVE mg/dL
Leukocytes, UA: NEGATIVE
NITRITE: NEGATIVE
Protein, ur: NEGATIVE mg/dL
SPECIFIC GRAVITY, URINE: 1.011 (ref 1.005–1.030)
pH: 5 (ref 5.0–8.0)

## 2016-03-06 LAB — BRAIN NATRIURETIC PEPTIDE: B Natriuretic Peptide: 709 pg/mL — ABNORMAL HIGH (ref 0.0–100.0)

## 2016-03-06 LAB — I-STAT TROPONIN, ED: Troponin i, poc: 0.04 ng/mL (ref 0.00–0.08)

## 2016-03-06 LAB — CREATININE, SERUM
CREATININE: 3.53 mg/dL — AB (ref 0.61–1.24)
GFR, EST AFRICAN AMERICAN: 19 mL/min — AB (ref >60)
GFR, EST NON AFRICAN AMERICAN: 16 mL/min — AB (ref >60)

## 2016-03-06 LAB — D-DIMER, QUANTITATIVE: D-Dimer, Quant: 0.32 ug/mL-FEU (ref 0.00–0.50)

## 2016-03-06 LAB — LACTIC ACID, PLASMA
LACTIC ACID, VENOUS: 1.6 mmol/L (ref 0.5–1.9)
Lactic Acid, Venous: 2.4 mmol/L (ref 0.5–1.9)

## 2016-03-06 LAB — GLUCOSE, CAPILLARY: Glucose-Capillary: 196 mg/dL — ABNORMAL HIGH (ref 65–99)

## 2016-03-06 LAB — PROTIME-INR
INR: 4.53
PROTHROMBIN TIME: 44.6 s — AB (ref 11.4–15.2)

## 2016-03-06 MED ORDER — PENTOXIFYLLINE ER 400 MG PO TBCR
400.0000 mg | EXTENDED_RELEASE_TABLET | Freq: Two times a day (BID) | ORAL | Status: DC
  Administered 2016-03-07 – 2016-03-09 (×6): 400 mg via ORAL
  Filled 2016-03-06 (×10): qty 1

## 2016-03-06 MED ORDER — SERTRALINE HCL 100 MG PO TABS
100.0000 mg | ORAL_TABLET | Freq: Every day | ORAL | Status: DC
  Administered 2016-03-07 – 2016-03-14 (×8): 100 mg via ORAL
  Filled 2016-03-06 (×8): qty 1

## 2016-03-06 MED ORDER — SODIUM CHLORIDE 0.9 % IV SOLN
1500.0000 mg | Freq: Once | INTRAVENOUS | Status: AC
  Administered 2016-03-06: 1500 mg via INTRAVENOUS
  Filled 2016-03-06: qty 1500

## 2016-03-06 MED ORDER — INSULIN ASPART 100 UNIT/ML ~~LOC~~ SOLN
0.0000 [IU] | Freq: Three times a day (TID) | SUBCUTANEOUS | Status: DC
  Administered 2016-03-06 – 2016-03-07 (×2): 2 [IU] via SUBCUTANEOUS
  Administered 2016-03-07: 3 [IU] via SUBCUTANEOUS
  Administered 2016-03-07: 5 [IU] via SUBCUTANEOUS
  Administered 2016-03-08: 9 [IU] via SUBCUTANEOUS
  Administered 2016-03-08 (×2): 7 [IU] via SUBCUTANEOUS
  Administered 2016-03-09 – 2016-03-10 (×4): 5 [IU] via SUBCUTANEOUS

## 2016-03-06 MED ORDER — SODIUM CHLORIDE 0.9 % IV SOLN
1500.0000 mg | INTRAVENOUS | Status: DC
  Administered 2016-03-08: 1500 mg via INTRAVENOUS
  Filled 2016-03-06 (×2): qty 1500

## 2016-03-06 MED ORDER — ONDANSETRON HCL 4 MG/2ML IJ SOLN: 4.0000 mg | Freq: Four times a day (QID) | INTRAMUSCULAR | Status: DC | PRN

## 2016-03-06 MED ORDER — ONDANSETRON HCL 4 MG PO TABS: 4.0000 mg | ORAL_TABLET | Freq: Four times a day (QID) | ORAL | Status: DC | PRN

## 2016-03-06 MED ORDER — METOPROLOL TARTRATE 12.5 MG HALF TABLET
12.5000 mg | ORAL_TABLET | Freq: Two times a day (BID) | ORAL | Status: DC
  Administered 2016-03-07 – 2016-03-09 (×6): 12.5 mg via ORAL
  Filled 2016-03-06 (×6): qty 1

## 2016-03-06 MED ORDER — FUROSEMIDE 10 MG/ML IJ SOLN
160.0000 mg | Freq: Once | INTRAMUSCULAR | Status: AC
  Administered 2016-03-06: 160 mg via INTRAVENOUS
  Filled 2016-03-06: qty 16

## 2016-03-06 MED ORDER — SODIUM CHLORIDE 0.9 % IV SOLN
INTRAVENOUS | Status: DC
  Administered 2016-03-06: 250 mL via INTRAVENOUS

## 2016-03-06 MED ORDER — WARFARIN - PHARMACIST DOSING INPATIENT: Status: DC

## 2016-03-06 MED ORDER — HEPARIN SODIUM (PORCINE) 5000 UNIT/ML IJ SOLN: 5000.0000 [IU] | Freq: Three times a day (TID) | INTRAMUSCULAR | Status: DC

## 2016-03-06 MED ORDER — LEVOTHYROXINE SODIUM 25 MCG PO TABS
25.0000 ug | ORAL_TABLET | ORAL | Status: DC
Start: 2016-03-06 — End: 2016-03-06

## 2016-03-06 MED ORDER — LEVOTHYROXINE SODIUM 25 MCG PO TABS
25.0000 ug | ORAL_TABLET | ORAL | Status: DC
  Administered 2016-03-10 – 2016-03-14 (×5): 25 ug via ORAL
  Filled 2016-03-06 (×5): qty 1

## 2016-03-06 MED ORDER — LEVOTHYROXINE SODIUM 50 MCG PO TABS
50.0000 ug | ORAL_TABLET | ORAL | Status: DC
  Administered 2016-03-09: 50 ug via ORAL
  Filled 2016-03-06 (×3): qty 1

## 2016-03-06 MED ORDER — PIPERACILLIN-TAZOBACTAM 3.375 G IVPB
3.3750 g | Freq: Three times a day (TID) | INTRAVENOUS | Status: DC
  Administered 2016-03-07 – 2016-03-10 (×11): 3.375 g via INTRAVENOUS
  Filled 2016-03-06 (×13): qty 50

## 2016-03-06 MED ORDER — OSELTAMIVIR PHOSPHATE 75 MG PO CAPS: 75.0000 mg | ORAL_CAPSULE | Freq: Two times a day (BID) | ORAL | 0 refills | Status: AC

## 2016-03-06 MED ORDER — PIPERACILLIN-TAZOBACTAM 3.375 G IVPB 30 MIN
3.3750 g | Freq: Once | INTRAVENOUS | Status: AC
  Administered 2016-03-06: 3.375 g via INTRAVENOUS
  Filled 2016-03-06: qty 50

## 2016-03-06 MED ORDER — VANCOMYCIN HCL 10 G IV SOLR
INTRAVENOUS | Status: AC
  Filled 2016-03-06: qty 1500

## 2016-03-06 NOTE — ED Notes (Signed)
Pt placed on O2 at 6l [place].

## 2016-03-06 NOTE — Progress Notes (Signed)
Pt went to CT off BIPAP and was on 3lpm cann upon coming back pt c/o sob so BIPAP back on with previous settings

## 2016-03-06 NOTE — ED Triage Notes (Signed)
Sob today. 1 duoneb and 125mg  solumedrol IV given in route. Pt on CPAP upon arrival to ED.

## 2016-03-06 NOTE — Progress Notes (Signed)

## 2016-03-06 NOTE — ED Provider Notes (Addendum)
[facility]PT Provider Note   CSN: [id number] Arrival date & time: 03/06/16  1222     History   Chief Complaint Chief Complaint  Patient presents with  . Shortness of Breath    HPI [name] is a 71 y.o. male.  He presents for evaluation of shortness of breath, which started today. He denies chest pain, weakness, dizziness, chronic cough, or other recent illnesses. He has a history of congestive heart failure, and renal insufficiency, with anticipated dialysis, in the future. He apparently has had a graft placed. During the recent hospitalization, he received dialysis by a chest catheter, to stabilize his fluid status. He had an illness, like a cold, which his wife also had, last week. He's been having trouble sleeping because of ongoing cough. His wife reports that she is trying to get him to use his albuterol. He was transferred by EMS, with reported normal vital signs, but they placed him on CPAP because of suspected respiratory distress. He apparently showed improvement in his respiratory status after placement of CPAP. There are no other no modifying factors.  HPI  Past Medical History:  Diagnosis Date  . Cardiac pacemaker in situ 08/03/2012   Indications transient complete heart block Insertion 08/03/12 Dr. Graciela HusbandsKlein  LV lead Medtronic 5076 serial number [id number]  LA lead Medtronic 5076 serial number [id number] Medtronic MRI compatible pulse generator serial number [id number].       . CHF (congestive heart failure) (HCC)   . Chronic diastolic heart failure (HCC)   . Chronic kidney disease (CKD), stage III (moderate) 08/02/2012  . COPD (chronic obstructive pulmonary disease) (HCC)   . 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 Hyperlipidemia   . Hypertension   . Hypertensive heart disease   . Hypothyroidism   . 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 LBBB (left bundle branch block) 08/02/2012  . Obesity (BMI 30-39.9)     Patient  Active Problem List   Diagnosis Date Noted  . Acute renal failure with acute tubular necrosis superimposed on stage 5 chronic kidney disease, not on chronic dialysis (HCC)   . Essential hypertension 01/23/2016  . Anemia of chronic disease 01/23/2016  . Acute renal failure superimposed on chronic kidney disease (HCC)   . COPD (chronic obstructive pulmonary disease) (HCC) 01/17/2016  . Pressure injury of skin 01/16/2016  . History of complete heart block   . Chronic anticoagulation   . OSA (obstructive sleep apnea)   . CHF (congestive heart failure) (HCC) 01/15/2016  . Acute CHF (congestive heart failure) (HCC) 01/15/2016  . 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 Persistent atrial fibrillation (HCC) 11/09/2012  . Acute on chronic diastolic CHF (congestive heart failure) (HCC)   . Hypothyroidism   . Hyperlipidemia   . Cardiac pacemaker in situ 08/03/2012  . Hypertensive heart disease   . Obesity (BMI 30-39.9)   . Chronic kidney disease (CKD), stage III (moderate) 08/02/2012  . LBBB (left bundle branch block) 08/02/2012  . IDDM (insulin dependent diabetes mellitus) (HCC) 08/02/2012    Past Surgical History:  Procedure Laterality Date  . AV FISTULA PLACEMENT Right 01/28/2016   Procedure: Right first stage basilic vein transposition (brachiobasilic arteriovenous fistula) placement;  Surgeon: Fransisco HertzBrian L Chen, MD;  [facility];  Service: Vascular;  Laterality: Right;  . CARDIOVERSION N/A 01/03/2016   Procedure: CARDIOVERSION;  Surgeon: Laurey Moralealton S McLean, MD;  [facility];  Service: Cardiovascular;  Laterality: N/A;  . INSERTION OF DIALYSIS CATHETER Right 01/28/2016  Procedure: INSERTION OF DIALYSIS CATHETER RIGHT INTERNAL JUGULAR;  Surgeon: Fransisco Hertz, MD;  Location: [facility] OR;  Service: Vascular;  Laterality: Right;  . PERMANENT PACEMAKER INSERTION N/A 08/03/2012   Procedure: PERMANENT PACEMAKER INSERTION;  Surgeon: Duke Salvia, MD;  Location: [facility] CATH LAB;   Service: Cardiovascular;  Laterality: N/A;       Home Medications    Prior to Admission medications   Medication Sig Start Date End Date Taking? Authorizing Provider  acetaminophen (TYLENOL) 500 MG tablet Take 1,000 mg by mouth every 6 (six) hours as needed for moderate pain.   Yes Historical Provider, MD  albuterol-ipratropium (COMBIVENT) 18-103 MCG/ACT inhaler Inhale 1 puff into the lungs 4 (four) times daily.   Yes Historical Provider, MD  atorvastatin (LIPITOR) 80 MG tablet Take 40 mg by mouth at bedtime.   Yes Historical Provider, MD  Cholecalciferol (VITAMIN D) 2000 UNITS CAPS Take 2,000 Units by mouth daily.   Yes Historical Provider, MD  ferrous sulfate 325 (65 FE) MG tablet Take 325 mg by mouth 2 (two) times daily.   Yes Historical Provider, MD  fish oil-omega-3 fatty acids 1000 MG capsule Take 1 g by mouth 2 (two) times daily.   Yes Historical Provider, MD  folic acid (FOLVITE) 1 MG tablet Take 1 mg by mouth daily.   Yes Historical Provider, MD  furosemide (LASIX) 80 MG tablet Take 2 tablets (160 mg total) by mouth 2 (two) times daily. 02/01/16  Yes Jessica Ratliff Hoffman, DO  insulin aspart (NOVOLOG) 100 UNIT/ML injection Inject 20 Units into the skin 3 (three) times daily before meals.   Yes Historical Provider, MD  insulin glargine (LANTUS) 100 UNIT/ML injection Inject 52 Units into the skin at bedtime.    Yes Historical Provider, MD  ketotifen (ZADITOR) 0.025 % ophthalmic solution Place 1 drop into both eyes 2 (two) times daily as needed.   Yes Historical Provider, MD  levothyroxine (SYNTHROID, LEVOTHROID) 25 MCG tablet Take 25-50 mcg by mouth See admin instructions. Take 25 mcg daily except on Saturday and Sunday take 50 mcg   Yes Historical Provider, MD  metoprolol (LOPRESSOR) 25 MG tablet Take 0.5 tablets (12.5 mg total) by mouth 2 (two) times daily. 02/01/16  Yes Jessica Ratliff Hoffman, DO  mirtazapine (REMERON) 15 MG tablet Take 15 mg by mouth at bedtime.   Yes Historical  Provider, MD  omeprazole (PRILOSEC) 20 MG capsule Take 20 mg by mouth daily.   Yes Historical Provider, MD  pentoxifylline (TRENTAL) 400 MG CR tablet Take 400 mg by mouth 2 (two) times daily.    Yes Historical Provider, MD  sertraline (ZOLOFT) 100 MG tablet Take 100 mg by mouth daily.   Yes Historical Provider, MD  thiamine (VITAMIN B-1) 100 MG tablet Take 100 mg by mouth daily.   Yes Historical Provider, MD  triamcinolone cream (KENALOG) 0.1 % Apply 1 application topically daily. 02/20/16  Yes Historical Provider, MD  warfarin (COUMADIN) 2.5 MG tablet Take 2.5 mg by mouth See admin instructions. Take only on Tuesday, Wednesday,thursday,saturday,sunday. 11/13/15  Yes Historical Provider, MD  Rayburn Ma Oil (ARTIFICIAL TEARS) OINT ophthalmic ointment Place 1 application into both eyes every evening.   Yes Historical Provider, MD  oseltamivir (TAMIFLU) 75 MG capsule Take 1 capsule (75 mg total) by mouth 2 (two) times daily. Patient not taking: Reported on 03/06/2016 03/06/16   Beau Fanny, FNP    Family History No family history on file.  Social History Social History  Substance Use Topics  .  Smoking status: Former Games developer  . Smokeless tobacco: Never Used  . Alcohol use No     Allergies   No known allergies   Review of Systems Review of Systems  All other systems reviewed and are negative.    Physical Exam Updated Vital Signs BP 131/57   Pulse 61   Temp 98 F (36.7 C) (Rectal)   Resp 16   SpO2 100%   Physical Exam  Constitutional: He appears well-developed. He has a sickly appearance.  Elderly, frail  HENT:  Head: Normocephalic and atraumatic.  Right Ear: External ear normal.  Left Ear: External ear normal.  Eyes: Conjunctivae and EOM are normal. Pupils are equal, round, and reactive to light.  Neck: Normal range of motion and phonation normal. Neck supple.  Cardiovascular: Normal rate, regular rhythm and normal heart sounds.   Pulmonary/Chest: Effort  normal. No respiratory distress. He has no wheezes. He exhibits no bony tenderness.  Shallow air movement bilaterally. On CPAP during evaluation. Oxygenation normal.  Abdominal: Soft. There is no tenderness.  Musculoskeletal: Normal range of motion.  Moderate left lower leg edema, mild right lower leg edema. Legs nontender to palpation.  Neurological: He is alert. No cranial nerve deficit or sensory deficit. He exhibits normal muscle tone. Coordination normal.  Dysarthria. No aphasia.  Skin: Skin is warm, dry and intact.  Psychiatric: He has a normal mood and affect. His behavior is normal. Judgment and thought content normal.  Nursing note and vitals reviewed.    ED Treatments / Results  Labs (all labs ordered are listed, but only abnormal results are displayed) Labs Reviewed  CBC WITH DIFFERENTIAL/PLATELET - Abnormal; Notable for the following:       Result Value   RBC 3.02 (*)    Hemoglobin 8.6 (*)    HCT 29.5 (*)    MCHC 29.2 (*)    RDW 19.1 (*)    All other components within normal limits  HEPATIC FUNCTION PANEL - Abnormal; Notable for the following:    Albumin 2.8 (*)    All other components within normal limits  BRAIN NATRIURETIC PEPTIDE - Abnormal; Notable for the following:    B Natriuretic Peptide 709.0 (*)    All other components within normal limits  BLOOD GAS, ARTERIAL - Abnormal; Notable for the following:    pO2, Arterial 31.9 (*)    All other components within normal limits  URINALYSIS, ROUTINE W REFLEX MICROSCOPIC - Abnormal; Notable for the following:    Hgb urine dipstick SMALL (*)    All other components within normal limits  PROTIME-INR - Abnormal; Notable for the following:    Prothrombin Time 44.6 (*)    INR 4.53 (*)    All other components within normal limits  I-STAT CHEM 8, ED - Abnormal; Notable for the following:    BUN 91 (*)    Creatinine, Ser 3.60 (*)    Hemoglobin 9.9 (*)    HCT 29.0 (*)    All other components within normal limits    D-DIMER, QUANTITATIVE (NOT AT [facility])  BLOOD GAS, ARTERIAL  I-STAT TROPOININ, ED    Pro B Natriuretic peptide (BNP)  Date Value Ref Range Status  08/03/2012 1,959.0 (H) 0 - 125 pg/mL Final  08/02/2012 1,103.0 (H) 0 - 125 pg/mL Final  08/01/2012 626.6 (H) 0 - 125 pg/mL Final       EKG  EKG Interpretation  Date/Time:  Thursday March 06 2016 12:24:11 EST Ventricular Rate:  80 PR Interval:  QRS Duration: 169 QT Interval:  480 QTC Calculation: 554 R Axis:   -170 Text Interpretation:  Atrial fibrillation Nonspecific intraventricular conduction delay Repol abnrm suggests ischemia, lateral leads Since last tracing Atrial fibrillation with native pacing has replaced electronic V-pacing Confirmed by Effie Shy  MD, Medard Decuir [id number]) on 03/06/2016 12:41:07 PM       Radiology Dg Chest Port 1 View  Result Date: 03/06/2016 CLINICAL DATA:  Shortness of breath. EXAM: PORTABLE CHEST 1 VIEW COMPARISON:  01/19/2016.  11/14/2015. FINDINGS: Cardiac pacer with lead tip over the right atrium right ventricle. Cardiomegaly with bilateral pulmonary infiltrates consistent pulmonary edema noted. Persistent right upper lobe infiltrate consistent with pneumonia. IMPRESSION: 1. Cardiac pacer with lead tips in right atrium right ventricle. 2. Cardiomegaly with bilateral pulmonary infiltrates consistent with congestive heart failure again noted. 3. Persistent right upper lobe infiltrate consistent with pneumonia . No interim improvement from prior exam. Continued close follow-up exams to demonstrate clearing suggested. Electronically Signed   By: Maisie Fus  Register   On: 03/06/2016 12:41    Procedures Procedures (including critical care time)  Medications Ordered in ED Medications  furosemide (LASIX) 160 mg in dextrose 5 % 50 mL IVPB (0 mg Intravenous Stopped 03/06/16 1515)     Initial Impression / Assessment and Plan / ED Course  I have reviewed the triage vital signs and the nursing notes.  Pertinent labs  & imaging results that were available during my care of the patient were reviewed by me and considered in my medical decision making (see chart for details).  Clinical Course as of Mar 06 1818  Thu Mar 06, 2016  1336 Low Hemoglobin: (!) 8.6 [EW]  1336 Normal D-Dimer, Quant: 0.32 [EW]  1336 High B Natriuretic Peptide: (!) 709.0 [EW]  1336 High BUN: (!) 91 [EW]  1337 High, stable Creatinine: (!) 3.60 [EW]  1337 Low Albumin: (!) 2.8 [EW]  1337 CHF with right upper lobe consolidation, nonspecific. DG Chest Port 1 View [EW]  1530 Bladder scan with 350 cc in the bladder, Foley catheter ordered to monitor fluid status, and check urinalysis  [EW]  1539 Slightly elevated from value one month ago B Natriuretic Peptide: (!) 709.0 [EW]  1612 Prolonged above therapeutic Prothrombin Time: (!) 44.6 [EW]  1754 Hospitalist call be back to discuss case. He feels that the patient will be best managed, at [place]. He would like me to talk to the nephrologist, there. At this time, the patient is arousable to voice, but falls back asleep. Oxygenation 100% on BiPAP with low flow oxygen at this time.  [EW]  1819 Case is discussed with nephrology, Dr. Kathrene Bongo at [facility]. She will see the patient has a Research scientist (medical), when he gets there. She states no additional Lasix, should be given at this time.  [EW]    Clinical Course User Index [EW] Mancel Bale, MD    Medications  furosemide (LASIX) 160 mg in dextrose 5 % 50 mL IVPB (0 mg Intravenous Stopped 03/06/16 1515)    Patient Vitals for the past 24 hrs:  BP Temp Temp src Pulse Resp SpO2  03/06/16 1800 131/57 - - 61 16 100 %  03/06/16 1700 121/58 - - 61 16 100 %  03/06/16 1630 (!) 135/49 - - 60 18 100 %  03/06/16 1600 (!) 130/48 - - 62 18 100 %  03/06/16 1532 141/88 - - 82 17 100 %  03/06/16 1516 111/71 - - 61 18 96 %  03/06/16 1400 122/72 - - 64 18  100 %  03/06/16 1319 110/60 - - 77 16 100 %  03/06/16 1233 146/58 - - 75 19 100 %  03/06/16 1232 -  98 F (36.7 C) Rectal - - -  03/06/16 1220 146/58 - - 77 22 100 %  03/06/16 1216 - - - - - 94 %    3:49 PM Reevaluation with update and discussion. After initial assessment and treatment, an updated evaluation reveals He remains somewhat somnolent, and was placed back on BiPAP because of increased work of breathing. Lylliana Kitamura L    3:49PM-Consult complete with Hospitalist. Patient case explained and discussed. He agrees to admit patient for further evaluation and treatment. Call ended at 17:15  CRITICAL CARE Performed by: Flint Melter Total critical care time: 75 minutes Critical care time was exclusive of separately billable procedures and treating other patients. Critical care was necessary to treat or prevent imminent or life-threatening deterioration. Critical care was time spent personally by me on the following activities: development of treatment plan with patient and/or surrogate as well as nursing, discussions with consultants, evaluation of patient's response to treatment, examination of patient, obtaining history from patient or surrogate, ordering and performing treatments and interventions, ordering and review of laboratory studies, ordering and review of radiographic studies, pulse oximetry and re-evaluation of patient's condition.  Final Clinical Impressions(s) / ED Diagnoses   Final diagnoses:  Acute on chronic systolic congestive heart failure (HCC)  COPD exacerbation (HCC)  Renal insufficiency  Poisoning by warfarin sodium, accidental or unintentional, initial encounter    Nonspecific shortness of breath ongoing following a upper respiratory infection last week. Congestive heart failure present, today, with ongoing chronic renal insufficiency. Patient is somewhat sleepy but does not appear to be hypercarbic, and I doubt that he is in significant respiratory failure. He has a nonfocal neurologic exam. Suspect combined COPD exacerbation with mild congestive heart  failure in a chronically debilitated patient, as the primary explanation for his trouble breathing, and disability today. Patient had to be put back on BiPAP for some mild respiratory distress. His chest x-ray is abnormal, with possible infiltrate and congestive heart failure. Overall, there is no good diagnostic data to support a diagnosis of pneumonia. Doubt ACS, PE, pneumonia, impending vascular collapse. He will need to be admitted to a stepdown unit.  Nursing Notes Reviewed/ Care Coordinated, and agree without changes. Applicable Imaging Reviewed.  Interpretation of Laboratory Data incorporated into ED treatment  Plan: Admit   New Prescriptions New Prescriptions   No medications on file     Mancel Bale, MD 03/06/16 1718    Mancel Bale, MD 03/06/16 1821

## 2016-03-06 NOTE — ED Notes (Signed)
O2 decreased to 2 l/[place]

## 2016-03-06 NOTE — ED Notes (Signed)
O2 decreased to 4L [place].

## 2016-03-06 NOTE — Progress Notes (Signed)
Pt is on NIV at this time tolerating it well no distress noted. Pt is resting comfortably.

## 2016-03-06 NOTE — ED Notes (Signed)
Pt remains sleepy, mouth breathing, dyspnea at rest, using accessory muscles. RT called to assess pt.

## 2016-03-06 NOTE — ED Notes (Signed)
Wife had to leave states to call her if needed. Zachary PacificMargaret Broadwater [phone]

## 2016-03-06 NOTE — Progress Notes (Signed)
Pharmacy Antibiotic Note  [name] is a 71 y.o. male admitted on 03/06/2016 with pneumonia.  Pharmacy has been consulted for Vancomycin and Zosyn dosing. Obesity/Normalized CrCl Vancomycin dosing protocol will be initiated with an estimated normalized CrCl = 19 ml/min.   Plan: Vancomycin 1500 IV every 48 hours.  Goal trough 15-20 mcg/mL. Zosyn 3.375g IV q8h (4 hour infusion). Monitor labs, micro and vitals.  Warfarin per pharmacy w/u in AM.  INR elevated on admission.  No dose tonight, daily PT/INR ordered.  Height: 5\' 10"  (177.8 cm) Weight: 244 lb (110.7 kg) IBW/kg (Calculated) : 73  Temp (24hrs), Avg:98 F (36.7 C), Min:98 F (36.7 C), Max:98 F (36.7 C)   Recent Labs Lab 03/06/16 1235 03/06/16 1236  WBC  --  7.6  CREATININE 3.60*  --     Estimated Creatinine Clearance: 23.8 mL/min (by C-G formula based on SCr of 3.6 mg/dL (H)).    Allergies  Allergen Reactions  . No Known Allergies    Antimicrobials this admission: Vanc 1/18 >>  Zosyn 1/18 >>   Dose adjustments this admission: Obesity/Normalized CrCl Vancomycin dosing protocol.  Microbiology results: 1/18 BCx: pending  Thank you for allowing pharmacy to be a part of this patient's care.  Mady GemmaHayes, [place] R 03/06/2016 7:27 PM

## 2016-03-06 NOTE — H&P (Addendum)
TRH H&P    Patient Demographics:    [name], is a 71 y.o. male  MRN: [id number]  DOB - January 31, 1946  Admit Date - 03/06/2016  Referring MD/NP/PA: Dr. Micah Flesher  Outpatient Primary MD for the patient is Marlowe Shores, MD  Patient coming from: Home  Chief Complaint  Patient presents with  . Shortness of Breath      HPI:    [name]  is a 71 y.o. male, With a history of chronic diastolic CHF, see daily stage IV, paroxysmal atrial fibrillation on Coumadin, hypertension, diabetes mellitus who was brought to the hospital with worsening shortness of breath. Patient at this time is on BiPAP and unable to point any history. He is somnolent but arousable. History obtained from the ED physician, Epic notes and patient's wife  Patient was recently hospitalized for acute on chronic diastolic CHF at [facility]. He received dialysis for pulmonary edema. Patient was discharged on December 15 and was doing . Lastly patient walked upper respiratory infection and since then patient has been having cough with worsening shortness of breath. He today was put on CPAP by EMS and transported to ED for further evaluation .  In the ED patient received high-dose Lasix 160 mg IV 1 , is on BiPAP still lethargic and somnolent. ABG showed pH 7.357, PCO2 47.6, PO2 31.9.    Review of systems:    Review of systems unobtainable as patient on BiPAP, somnolent.      With Past History of the following :    Past Medical History:  Diagnosis Date  . Cardiac pacemaker in situ 08/03/2012   Indications transient complete heart block Insertion 08/03/12 Dr. Graciela Husbands  LV lead Medtronic 5076 serial number [id number]  LA lead Medtronic 5076 serial number [id number] Medtronic MRI compatible pulse generator serial number [id number].       . CHF (congestive heart failure) (HCC)   . Chronic diastolic heart failure (HCC)   .  Chronic kidney disease (CKD), stage III (moderate) 08/02/2012  . COPD (chronic obstructive pulmonary disease) (HCC)   . 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 Hyperlipidemia   . Hypertension   . Hypertensive heart disease   . Hypothyroidism   . 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 LBBB (left bundle branch block) 08/02/2012  . Obesity (BMI 30-39.9)       Past Surgical History:  Procedure Laterality Date  . AV FISTULA PLACEMENT Right 01/28/2016   Procedure: Right first stage basilic vein transposition (brachiobasilic arteriovenous fistula) placement;  Surgeon: Fransisco Hertz, MD;  Location: [facility] OR;  Service: Vascular;  Laterality: Right;  . CARDIOVERSION N/A 01/03/2016   Procedure: CARDIOVERSION;  Surgeon: Laurey Morale, MD;  Location: [facility] ENDOSCOPY;  Service: Cardiovascular;  Laterality: N/A;  . INSERTION OF DIALYSIS CATHETER Right 01/28/2016   Procedure: INSERTION OF DIALYSIS CATHETER RIGHT INTERNAL JUGULAR;  Surgeon: Fransisco Hertz, MD;  Location: [facility] OR;  Service: Vascular;  Laterality: Right;  . PERMANENT PACEMAKER INSERTION N/A 08/03/2012   Procedure: PERMANENT PACEMAKER INSERTION;  Surgeon: Duke Salvia, MD;  Location: [facility] CATH LAB;  Service: Cardiovascular;  Laterality: N/A;      Social History:      Social History  Substance Use Topics  . Smoking status: Former Games developer  . Smokeless tobacco: Never Used  . Alcohol use No       Family History :   Family history is unobtainable   Home Medications:   Prior to Admission medications   Medication Sig Start Date End Date Taking? Authorizing Provider  acetaminophen (TYLENOL) 500 MG tablet Take 1,000 mg by mouth every 6 (six) hours as needed for moderate pain.   Yes Historical Provider, MD  albuterol-ipratropium (COMBIVENT) 18-103 MCG/ACT inhaler Inhale 1 puff into the lungs 4 (four) times daily.   Yes Historical Provider, MD  atorvastatin (LIPITOR) 80 MG tablet Take 40 mg by mouth at bedtime.    Yes Historical Provider, MD  Cholecalciferol (VITAMIN D) 2000 UNITS CAPS Take 2,000 Units by mouth daily.   Yes Historical Provider, MD  ferrous sulfate 325 (65 FE) MG tablet Take 325 mg by mouth 2 (two) times daily.   Yes Historical Provider, MD  fish oil-omega-3 fatty acids 1000 MG capsule Take 1 g by mouth 2 (two) times daily.   Yes Historical Provider, MD  folic acid (FOLVITE) 1 MG tablet Take 1 mg by mouth daily.   Yes Historical Provider, MD  furosemide (LASIX) 80 MG tablet Take 2 tablets (160 mg total) by mouth 2 (two) times daily. 02/01/16  Yes Jessica Ratliff Hoffman, DO  insulin aspart (NOVOLOG) 100 UNIT/ML injection Inject 20 Units into the skin 3 (three) times daily before meals.   Yes Historical Provider, MD  insulin glargine (LANTUS) 100 UNIT/ML injection Inject 52 Units into the skin at bedtime.    Yes Historical Provider, MD  ketotifen (ZADITOR) 0.025 % ophthalmic solution Place 1 drop into both eyes 2 (two) times daily as needed.   Yes Historical Provider, MD  levothyroxine (SYNTHROID, LEVOTHROID) 25 MCG tablet Take 25-50 mcg by mouth See admin instructions. Take 25 mcg daily except on Saturday and Sunday take 50 mcg   Yes Historical Provider, MD  metoprolol (LOPRESSOR) 25 MG tablet Take 0.5 tablets (12.5 mg total) by mouth 2 (two) times daily. 02/01/16  Yes Jessica Ratliff Hoffman, DO  mirtazapine (REMERON) 15 MG tablet Take 15 mg by mouth at bedtime.   Yes Historical Provider, MD  omeprazole (PRILOSEC) 20 MG capsule Take 20 mg by mouth daily.   Yes Historical Provider, MD  pentoxifylline (TRENTAL) 400 MG CR tablet Take 400 mg by mouth 2 (two) times daily.    Yes Historical Provider, MD  sertraline (ZOLOFT) 100 MG tablet Take 100 mg by mouth daily.   Yes Historical Provider, MD  thiamine (VITAMIN B-1) 100 MG tablet Take 100 mg by mouth daily.   Yes Historical Provider, MD  triamcinolone cream (KENALOG) 0.1 % Apply 1 application topically daily. 02/20/16  Yes Historical Provider, MD    warfarin (COUMADIN) 2.5 MG tablet Take 2.5 mg by mouth See admin instructions. Take only on Tuesday, Wednesday,thursday,saturday,sunday. 11/13/15  Yes Historical Provider, MD  Rayburn Ma Oil (ARTIFICIAL TEARS) OINT ophthalmic ointment Place 1 application into both eyes every evening.   Yes Historical Provider, MD  oseltamivir (TAMIFLU) 75 MG capsule Take 1 capsule (75 mg total) by mouth 2 (two) times daily. Patient not taking: Reported on 03/06/2016 03/06/16   Beau Fanny, FNP     Allergies:     Allergies  Allergen Reactions  . No Known Allergies      Physical Exam:   Vitals  Blood pressure 121/58, pulse 61, temperature 98 F (36.7 C), temperature source Rectal, resp. rate 16, SpO2 100 %.  1.  General: Currently on BiPAP, accessory muscle use noted  2. Psychiatric:  I Not tested  3. Neurologic: Somnolent, arousable.   4. Eyes :  anicteric sclerae, moist conjunctivae with no lid lag. .  5. ENMT:  On BiPAP  6. Neck:  supple,   7. Respiratory : Mild respiratory distress, accessory muscle use noted. Decreased breath sounds bilaterally, bilateral rhonchi   8. Cardiovascular : RRR, no gallops, rubs or murmurs, trace leg edema bilaterally   9. Gastrointestinal:  Positive bowel sounds, abdomen soft, non-tender to palpation,no hepatosplenomegaly, no rigidity or guarding       10. Skin:  No cyanosis, normal texture and turgor, no rash, lesions or ulcers      Data Review:    CBC  Recent Labs Lab 03/06/16 1235 03/06/16 1236  WBC  --  7.6  HGB 9.9* 8.6*  HCT 29.0* 29.5*  PLT  --  181  MCV  --  97.7  MCH  --  28.5  MCHC  --  29.2*  RDW  --  19.1*  LYMPHSABS  --  1.5  MONOABS  --  0.4  EOSABS  --  0.0  BASOSABS  --  0.0   ------------------------------------------------------------------------------------------------------------------  Chemistries   Recent Labs Lab 03/06/16 1235 03/06/16 1236  NA 143  --   K 3.6  --   CL 103  --    GLUCOSE 88  --   BUN 91*  --   CREATININE 3.60*  --   AST  --  40  ALT  --  63  ALKPHOS  --  90  BILITOT  --  0.8   ------------------------------------------------------------------------------------------------------------------  ------------------------------------------------------------------------------------------------------------------ GFR: CrCl cannot be calculated (Unknown ideal weight.). Liver Function Tests:  Recent Labs Lab 03/06/16 1236  AST 40  ALT 63  ALKPHOS 90  BILITOT 0.8  PROT 7.8  ALBUMIN 2.8*   No results for input(s): LIPASE, AMYLASE in the last 168 hours. No results for input(s): AMMONIA in the last 168 hours. Coagulation Profile:  Recent Labs Lab 03/06/16 1236  INR 4.53*    --------------------------------------------------------------------------------------------------------------- Urine analysis:    Component Value Date/Time   COLORURINE YELLOW 03/06/2016 1450   APPEARANCEUR CLEAR 03/06/2016 1450   LABSPEC 1.011 03/06/2016 1450   PHURINE 5.0 03/06/2016 1450   GLUCOSEU NEGATIVE 03/06/2016 1450   HGBUR SMALL (A) 03/06/2016 1450   BILIRUBINUR NEGATIVE 03/06/2016 1450   KETONESUR NEGATIVE 03/06/2016 1450   PROTEINUR NEGATIVE 03/06/2016 1450   NITRITE NEGATIVE 03/06/2016 1450   LEUKOCYTESUR NEGATIVE 03/06/2016 1450      Imaging Results:    Dg Chest Port 1 View  Result Date: 03/06/2016 CLINICAL DATA:  Shortness of breath. EXAM: PORTABLE CHEST 1 VIEW COMPARISON:  01/19/2016.  11/14/2015. FINDINGS: Cardiac pacer with lead tip over the right atrium right ventricle. Cardiomegaly with bilateral pulmonary infiltrates consistent pulmonary edema noted. Persistent right upper lobe infiltrate consistent with pneumonia. IMPRESSION: 1. Cardiac pacer with lead tips in right atrium right ventricle. 2. Cardiomegaly with bilateral pulmonary infiltrates consistent with congestive heart failure again noted. 3. Persistent right upper lobe infiltrate  consistent with pneumonia . No interim improvement from prior exam. Continued close follow-up exams to demonstrate clearing suggested. Electronically Signed   By: Maisie Fus  Register   On: 03/06/2016 12:41  My personal review of EKG: Rhythm - atrial fibrillation    Assessment & Plan:   Acute respiratory failure Chronic kidney disease stage IV Diabetes mellitus Hypertension Hypothyroidism  1. Acute respiratory failure- likely from underlying diastolic CHF, does not appear infectious cause at this time. Chest x-ray shows bilateral pulmonary infiltrates consistent with congestive heart failure. He is afebrile, with normal WBC 7.6. He was given high-dose Lasix 160 mg IV 1 in the ED. Urine output 4 50 mL in  2 hours. Continue BiPAP. Patient might require hemodialysis if unable to diurese with diuretics. BNP 709 2. Persistent right upper lobe infiltrate/? Pneumonia- chest x-ray shows persistent right upper lobe infiltrate consistent with pneumonia, will empirically start on vancomycin and Zosyn. Check lactic acid, blood culture. If patient improves with diuretics, and clinical suspicions remains low, antibiotics can be discontinued. 3. Diabetes mellitus- start sliding scale insulin with NovoLog. Hold Lantus at this time. 4. Hypothyroidism- continue Synthroid 5. Hypertension- blood pressure stable, continue metoprolol 12.5 g twice a day 6. Atrial fibrillation- heart rate is controlled, patient on Coumadin. Continue metoprolol, will consult pharmacy for managing the dose of Coumadin. 7. Supratherapeutic INR- patient's INR is 4.53.   DVT Prophylaxis-   Heparin  AM Labs Ordered, also please review Full Orders  Family Communication: Admission, patients condition and plan of care including tests being ordered have been discussed with the patient's wife on phone  who indicate understanding and agree with the plan and Code Status.  Code Status: Full code  Admission status: Observation   Time spent  in minutes : 60 minutes   Ariv Penrod S M.D on 03/06/2016 at 5:49 PM  Between 7am to 7pm - Pager - [phone]. After 7pm go to [email or web address] - password [email or web address]  [facility] - Office  [phone]

## 2016-03-07 ENCOUNTER — Encounter (HOSPITAL_COMMUNITY): Payer: Self-pay | Admitting: General Practice

## 2016-03-07 DIAGNOSIS — J9601 Acute respiratory failure with hypoxia: Secondary | ICD-10-CM

## 2016-03-07 DIAGNOSIS — N179 Acute kidney failure, unspecified: Secondary | ICD-10-CM

## 2016-03-07 DIAGNOSIS — I110 Hypertensive heart disease with heart failure: Secondary | ICD-10-CM

## 2016-03-07 DIAGNOSIS — N189 Chronic kidney disease, unspecified: Secondary | ICD-10-CM

## 2016-03-07 LAB — COMPREHENSIVE METABOLIC PANEL
ALBUMIN: 2.5 g/dL — AB (ref 3.5–5.0)
ALK PHOS: 78 U/L (ref 38–126)
ALT: 50 U/L (ref 17–63)
AST: 32 U/L (ref 15–41)
Anion gap: 13 (ref 5–15)
BUN: 85 mg/dL — ABNORMAL HIGH (ref 6–20)
CALCIUM: 9.1 mg/dL (ref 8.9–10.3)
CHLORIDE: 104 mmol/L (ref 101–111)
CO2: 25 mmol/L (ref 22–32)
CREATININE: 3.5 mg/dL — AB (ref 0.61–1.24)
GFR calc Af Amer: 19 mL/min — ABNORMAL LOW (ref >60)
GFR calc non Af Amer: 16 mL/min — ABNORMAL LOW (ref >60)
GLUCOSE: 209 mg/dL — AB (ref 65–99)
Potassium: 4 mmol/L (ref 3.5–5.1)
Sodium: 142 mmol/L (ref 135–145)
Total Bilirubin: 1.2 mg/dL (ref 0.3–1.2)
Total Protein: 7.3 g/dL (ref 6.5–8.1)

## 2016-03-07 LAB — CBC
HCT: 30 % — ABNORMAL LOW (ref 39.0–52.0)
Hemoglobin: 9 g/dL — ABNORMAL LOW (ref 13.0–17.0)
MCH: 28.7 pg (ref 26.0–34.0)
MCHC: 30 g/dL (ref 30.0–36.0)
MCV: 95.5 fL (ref 78.0–100.0)
PLATELETS: 156 10*3/uL (ref 150–400)
RBC: 3.14 MIL/uL — AB (ref 4.22–5.81)
RDW: 19.6 % — ABNORMAL HIGH (ref 11.5–15.5)
WBC: 5.6 10*3/uL (ref 4.0–10.5)

## 2016-03-07 LAB — BLOOD GAS, ARTERIAL
Acid-Base Excess: 0.7 mmol/L (ref 0.0–2.0)
BICARBONATE: 25 mmol/L (ref 20.0–28.0)
DRAWN BY: 40415
Delivery systems: POSITIVE
EXPIRATORY PAP: 5
FIO2: 40
INSPIRATORY PAP: 10
O2 Saturation: 98.7 %
Patient temperature: 98.6
pCO2 arterial: 42 mmHg (ref 32.0–48.0)
pH, Arterial: 7.393 (ref 7.350–7.450)
pO2, Arterial: 123 mmHg — ABNORMAL HIGH (ref 83.0–108.0)

## 2016-03-07 LAB — PROTIME-INR
INR: 4.91 — AB
PROTHROMBIN TIME: 47.1 s — AB (ref 11.4–15.2)

## 2016-03-07 LAB — PROCALCITONIN: Procalcitonin: 0.22 ng/mL

## 2016-03-07 LAB — GLUCOSE, CAPILLARY
GLUCOSE-CAPILLARY: 170 mg/dL — AB (ref 65–99)
Glucose-Capillary: 204 mg/dL — ABNORMAL HIGH (ref 65–99)
Glucose-Capillary: 291 mg/dL — ABNORMAL HIGH (ref 65–99)
Glucose-Capillary: 324 mg/dL — ABNORMAL HIGH (ref 65–99)

## 2016-03-07 LAB — MRSA PCR SCREENING: MRSA BY PCR: NEGATIVE

## 2016-03-07 LAB — LACTIC ACID, PLASMA: Lactic Acid, Venous: 1.7 mmol/L (ref 0.5–1.9)

## 2016-03-07 LAB — CBG MONITORING, ED: GLUCOSE-CAPILLARY: 150 mg/dL — AB (ref 65–99)

## 2016-03-07 MED ORDER — IPRATROPIUM-ALBUTEROL 0.5-2.5 (3) MG/3ML IN SOLN
3.0000 mL | Freq: Four times a day (QID) | RESPIRATORY_TRACT | Status: DC
  Administered 2016-03-07 – 2016-03-14 (×30): 3 mL via RESPIRATORY_TRACT
  Filled 2016-03-07 (×30): qty 3

## 2016-03-07 MED ORDER — FUROSEMIDE 10 MG/ML IJ SOLN
160.0000 mg | Freq: Three times a day (TID) | INTRAVENOUS | Status: DC
  Administered 2016-03-07 – 2016-03-11 (×13): 160 mg via INTRAVENOUS
  Filled 2016-03-07 (×15): qty 16

## 2016-03-07 MED ORDER — METOLAZONE 5 MG PO TABS
5.0000 mg | ORAL_TABLET | ORAL | Status: DC
  Administered 2016-03-07 (×2): 5 mg via ORAL
  Filled 2016-03-07 (×2): qty 1

## 2016-03-07 MED ORDER — FUROSEMIDE 10 MG/ML IJ SOLN
120.0000 mg | Freq: Two times a day (BID) | INTRAMUSCULAR | Status: DC
  Filled 2016-03-07: qty 12

## 2016-03-07 NOTE — Progress Notes (Signed)
Upon assessment, pt lung sounds were very wet and pt complained of difficulty breathing. RT called and decision was made to place pt back on bipap for the night. Pt reported feeling better after bipap was restarted and is now resting. Will continue to monitor.

## 2016-03-07 NOTE — Progress Notes (Signed)
PROGRESS NOTE    [name]  [name]:[id number] DOB: 03/21/1945 DOA: 03/06/2016 PCP: Marlowe Shores, MD   Brief Narrative: [name]  is a 71 y.o. male, With a history of chronic diastolic CHF, see daily stage IV, paroxysmal atrial fibrillation on Coumadin, hypertension, diabetes mellitus who was brought to the hospital with worsening shortness of breath. Patient at this time is on BiPAP and unable to point any history. He is somnolent but arousable.   Assessment & Plan:   Active Problems:   IDDM (insulin dependent diabetes mellitus) (HCC)   Hypertensive heart disease   Acute on chronic diastolic CHF (congestive heart failure) (HCC)   Hypothyroidism   Persistent atrial fibrillation (HCC)   CHF (congestive heart failure) (HCC)   Chronic anticoagulation   Acute respiratory failure (HCC)    1-Acute Hypoxic Respiratory failure;  Patient currently on BIPAP.  He received IV lasix on 1-18. Out put 1.23 Will order IV lasix 120 mg IV BID, until renal is able to evaluate patient. Patient might need HD.  Continue with BIPAP. Will also consult CCM, high risk for needing intubation.   2-Acute encephalopathy;  This could be multifactorial, secondary to uremia, respiratory failure and or infection.  Treating possible cause.   3-Persistent right upper lobe infiltrate/? Pneumonia- Will need repeat Chest x ray and or CT chest when stable.  Would continue with IV antibiotics at this time.  Lactic acid trending down.   4-Diabetes mellitus- SSI. Hold lantus.   5-Hypothyroidism- continue Synthroid  6-Hypertension-  continue metoprolol.  7-Atrial fibrillation- heart rate is controlled, patient on Coumadin. Continue metoprolol, will consult pharmacy for managing the dose of Coumadin.  8-Supratherapeutic INR- patient's INR is 4.53, hold coumadin, supra therapeutic INR and hematuria.   9-Hematuria,; hold coumadin.    DVT prophylaxis: on coumadin.  Code Status: Full code.    Family Communication: None at bedside.  Disposition Plan: not stable to be discharge.    Consultants:   Nephrology  CCM   Procedures: none  Antimicrobials:   Vancomycin 1-18  Zosyn 1-18   Subjective: Lethargic on BIPAP. He has been lethargic since admission.   Objective: Vitals:   03/07/16 0230 03/07/16 0300 03/07/16 0330 03/07/16 0600  BP: (!) 142/54  (!) 126/54   Pulse: (!) 58 63 63   Resp: 13 14 13    Temp:    98 F (36.7 C)  TempSrc:    Axillary  SpO2: 100% 100% 100%   Weight:    110.5 kg (243 lb 9.7 oz)  Height:        Intake/Output Summary (Last 24 hours) at 03/07/16 0751 Last data filed at 03/07/16 0400  Gross per 24 hour  Intake              655 ml  Output             1075 ml  Net             -420 ml   Filed Weights   03/06/16 1904 03/06/16 2141 03/07/16 0600  Weight: 110.7 kg (244 lb) 110.6 kg (243 lb 13.3 oz) 110.5 kg (243 lb 9.7 oz)    Examination:  General exam: Patient is lethargic, on BIPAP, he open eyes to voice.  Respiratory system: Bilateral Air movement, on BIPAP/ Cardiovascular system: S1 & S2 heard, RRR. No JVD, murmurs, rubs, gallops or clicks. No pedal edema. Gastrointestinal system: Abdomen is nondistended, soft and nontender. No organomegaly or masses felt. Normal bowel sounds heard. Central nervous  system: lethargic, follows command, he was able to moves all four extremities to command.  Extremities: moves all extremities.  Skin: No rashes, lesions or ulcers     Data Reviewed: I have personally reviewed following labs and imaging studies  CBC:  Recent Labs Lab 03/06/16 1235 03/06/16 1236 03/07/16 0322  WBC  --  7.6 5.6  NEUTROABS  --  5.6  --   HGB 9.9* 8.6* 9.0*  HCT 29.0* 29.5* 30.0*  MCV  --  97.7 95.5  PLT  --  181 156   Basic Metabolic Panel:  Recent Labs Lab 03/06/16 1235 03/06/16 2232 03/07/16 0322  NA 143  --  142  K 3.6  --  4.0  CL 103  --  104  CO2  --   --  25  GLUCOSE 88  --  209*  BUN 91*   --  85*  CREATININE 3.60* 3.53* 3.50*  CALCIUM  --   --  9.1   GFR: Estimated Creatinine Clearance: 24.8 mL/min (by C-G formula based on SCr of 3.5 mg/dL (H)). Liver Function Tests:  Recent Labs Lab 03/06/16 1236 03/07/16 0322  AST 40 32  ALT 63 50  ALKPHOS 90 78  BILITOT 0.8 1.2  PROT 7.8 7.3  ALBUMIN 2.8* 2.5*   No results for input(s): LIPASE, AMYLASE in the last 168 hours. No results for input(s): AMMONIA in the last 168 hours. Coagulation Profile:  Recent Labs Lab 03/06/16 1236  INR 4.53*   Cardiac Enzymes: No results for input(s): CKTOTAL, CKMB, CKMBINDEX, TROPONINI in the last 168 hours. BNP (last 3 results) No results for input(s): PROBNP in the last 8760 hours. HbA1C: No results for input(s): HGBA1C in the last 72 hours. CBG:  Recent Labs Lab 03/06/16 2223 03/07/16 0733  GLUCAP 196* 170*   Lipid Profile: No results for input(s): CHOL, HDL, LDLCALC, TRIG, CHOLHDL, LDLDIRECT in the last 72 hours. Thyroid Function Tests: No results for input(s): TSH, T4TOTAL, FREET4, T3FREE, THYROIDAB in the last 72 hours. Anemia Panel: No results for input(s): VITAMINB12, FOLATE, FERRITIN, TIBC, IRON, RETICCTPCT in the last 72 hours. Sepsis Labs:  Recent Labs Lab 03/06/16 1845 03/06/16 2232 03/07/16 0322  LATICACIDVEN 1.6 2.4* 1.7    Recent Results (from the past 240 hour(s))  Culture, blood (Routine X 2) w Reflex to ID Panel     Status: None (Preliminary result)   Collection Time: 03/06/16  6:45 PM  Result Value Ref Range Status   Specimen Description RIGHT ANTECUBITAL  Final   Special Requests BOTTLES DRAWN AEROBIC ONLY 6CC  Final   Culture PENDING  Incomplete   Report Status PENDING  Incomplete  Culture, blood (Routine X 2) w Reflex to ID Panel     Status: None (Preliminary result)   Collection Time: 03/06/16  6:46 PM  Result Value Ref Range Status   Specimen Description LEFT ANTECUBITAL  Final   Special Requests BOTTLES DRAWN AEROBIC AND ANAEROBIC 6CC   Final   Culture PENDING  Incomplete   Report Status PENDING  Incomplete  MRSA PCR Screening     Status: None   Collection Time: 03/06/16  9:41 PM  Result Value Ref Range Status   MRSA by PCR NEGATIVE NEGATIVE Final    Comment:        The GeneXpert MRSA Assay (FDA approved for NASAL specimens only), is one component of a comprehensive MRSA colonization surveillance program. It is not intended to diagnose MRSA infection nor to guide or monitor treatment for MRSA  infections.          Radiology Studies: Ct Head Wo Contrast  Result Date: 03/06/2016 CLINICAL DATA:  Lethargy worsening shortness of breath EXAM: CT HEAD WITHOUT CONTRAST TECHNIQUE: Contiguous axial images were obtained from the base of the skull through the vertex without intravenous contrast. COMPARISON:  05/31/2011 FINDINGS: Brain: Motion artifact limits the exam. No gross territorial infarction, hemorrhage or mass is visualized. 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 Old appearing infarct in the left cerebellum. Moderate atrophy. Mild periventricular white matter hypodensity consistent with small vessel disease. Vascular: No hyperdense vessels.  Carotid artery calcifications. Skull: Mastoid air cells grossly clear.  No obvious skull fracture Sinuses/Orbits: Minimal mucosal thickening within the ethmoid sinuses. No acute orbital abnormality. Other: None IMPRESSION: No CT evidence for acute intracranial abnormality allowing for motion artifact. 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 Electronically Signed   By: Jasmine Pang M.D.   On: 03/06/2016 20:07   Dg Chest Port 1 View  Result Date: 03/06/2016 CLINICAL DATA:  Shortness of breath. EXAM: PORTABLE CHEST 1 VIEW COMPARISON:  01/19/2016.  11/14/2015. FINDINGS: Cardiac pacer with lead tip over the right atrium right ventricle.  Cardiomegaly with bilateral pulmonary infiltrates consistent pulmonary edema noted. Persistent right upper lobe infiltrate consistent with pneumonia. IMPRESSION: 1. Cardiac pacer with lead tips in right atrium right ventricle. 2. Cardiomegaly with bilateral pulmonary infiltrates consistent with congestive heart failure again noted. 3. Persistent right upper lobe infiltrate consistent with pneumonia . No interim improvement from prior exam. Continued close follow-up exams to demonstrate clearing suggested. Electronically Signed   By: Maisie Fus  Register   On: 03/06/2016 12:41        Scheduled Meds: . furosemide  120 mg Intravenous Q12H  . insulin aspart  0-9 Units Subcutaneous TID WC  . levothyroxine  25 mcg Oral Once per day on Mon Tue Wed Thu Fri  . [START ON 03/08/2016] levothyroxine  50 mcg Oral Once per day on Sun Sat  . metoprolol tartrate  12.5 mg Oral BID  . pentoxifylline  400 mg Oral BID  . piperacillin-tazobactam (ZOSYN)  IV  3.375 g Intravenous Q8H  . sertraline  100 mg Oral Daily  . [START ON 03/08/2016] vancomycin  1,500 mg Intravenous Q48H  . Warfarin - Pharmacist Dosing Inpatient   Does not apply Q24H   Continuous Infusions: . sodium chloride 10 mL/hr at 03/07/16 0400     LOS: 1 day    Time spent: 35 minutes.     Alba Cory, MD [facility] Pager [phone]  If 7PM-7AM, please contact night-coverage [email or web address] Password [email or web address] 03/07/2016, 7:51 AM

## 2016-03-07 NOTE — Consult Note (Signed)
[facility] Consult Note     Date: 03/07/2016                  Patient Name:  [name]  MRN: [id number]  DOB: [birth year]  Age / Sex: 71 y.o., male         PCP: Ephriam Jenkins, MD                 Service Requesting Consult: [facility]/ Dr. Frederic Jericho                 Reason for Consult: Vol overload in setting of CKD            Chief Complaint: SOB  HPI: Pt is a 71 y.o. year-old with history of DM, HTN, afib , diast CHF, HL and obesity, has CKD f/b kidney specialist at [facility] every 6 mos, admitted with SOB and decomp CHF.  History obtained from chart review as pt is lethargic and no family at bedside.    Briefly, pt was admitted in early December to [facility] for CHF exac and was refractory to high-dose diuretics.  He required dialysis for pulm edema but was able to leave off dialysis.  Had a fistula placed.    He comes back as a transfer from [place].  Was discharged Dec 15 from prior hospitalization and was doing well.  The past couple of days he has been reporting viral URI type symptoms and became increasingly SOB.  Went to [facility] ED where he was placed on BiPaP and received Lasix 160 IV x 1.    Asked to see for vol overload management in CKD. \  When seen this AM ; says he's feeling "fine" but a little sleepy.  Past Medical History:  Diagnosis Date  . Cardiac pacemaker in situ 08/03/2012   Indications transient complete heart block Insertion 08/03/12 Dr. Caryl Comes  LV lead Medtronic 5076 serial number [id number]  LA lead Medtronic 5076 serial number [id number] Medtronic MRI compatible pulse generator serial number [id number].       . CHF (congestive heart failure) ([place])   . Chronic diastolic heart failure ([place])   . Chronic kidney disease (CKD), stage III (moderate) 08/02/2012  . COPD (chronic obstructive pulmonary disease) ([place])   . DM (diabetes mellitus) with complications ([place]) 8/82/8003  . Hyperlipidemia   . Hypertension   . Hypertensive heart disease    . Hypothyroidism   . Intermittent complete heart block ([place]) 11/09/2012  . LBBB (left bundle branch block) 08/02/2012  . Obesity (BMI 30-39.9)     Past Surgical History:  Procedure Laterality Date  . AV FISTULA PLACEMENT Right 01/28/2016   Procedure: Right first stage basilic vein transposition (brachiobasilic arteriovenous fistula) placement;  Surgeon: Conrad Dimmitt, MD;  Location: [place];  Service: Vascular;  Laterality: Right;  . CARDIOVERSION N/A 01/03/2016   Procedure: CARDIOVERSION;  Surgeon: Larey Dresser, MD;  Location: [place];  Service: Cardiovascular;  Laterality: N/A;  . INSERT / REPLACE / REMOVE PACEMAKER    . INSERTION OF DIALYSIS CATHETER Right 01/28/2016   Procedure: INSERTION OF DIALYSIS CATHETER RIGHT INTERNAL JUGULAR;  Surgeon: Conrad [place], MD;  Location: [place];  Service: Vascular;  Laterality: Right;  . PERMANENT PACEMAKER INSERTION N/A 08/03/2012   Procedure: PERMANENT PACEMAKER INSERTION;  Surgeon: Deboraha Sprang, MD;  Location: [facility] CATH LAB;  Service: Cardiovascular;  Laterality: N/A;    History reviewed. No pertinent family history. Social History:  reports that he has quit  smoking. He has never used smokeless tobacco. He reports that he does not drink alcohol or use drugs.  Allergies:  Allergies  Allergen Reactions  . No Known Allergies     Medications Prior to Admission  Medication Sig Dispense Refill  . acetaminophen (TYLENOL) 500 MG tablet Take 1,000 mg by mouth every 6 (six) hours as needed for moderate pain.    Marland Kitchen albuterol-ipratropium (COMBIVENT) 18-103 MCG/ACT inhaler Inhale 1 puff into the lungs 4 (four) times daily.    Marland Kitchen atorvastatin (LIPITOR) 80 MG tablet Take 40 mg by mouth at bedtime.    . Cholecalciferol (VITAMIN D) 2000 UNITS CAPS Take 2,000 Units by mouth daily.    . ferrous sulfate 325 (65 FE) MG tablet Take 325 mg by mouth 2 (two) times daily.    . fish oil-omega-3 fatty acids 1000 MG capsule Take 1 g by mouth 2 (two) times daily.    .  folic acid (FOLVITE) 1 MG tablet Take 1 mg by mouth daily.    . furosemide (LASIX) 80 MG tablet Take 2 tablets (160 mg total) by mouth 2 (two) times daily. 120 tablet 1  . insulin aspart (NOVOLOG) 100 UNIT/ML injection Inject 20 Units into the skin 3 (three) times daily before meals.    . insulin glargine (LANTUS) 100 UNIT/ML injection Inject 52 Units into the skin at bedtime.     Marland Kitchen ketotifen (ZADITOR) 0.025 % ophthalmic solution Place 1 drop into both eyes 2 (two) times daily as needed.    Marland Kitchen levothyroxine (SYNTHROID, LEVOTHROID) 25 MCG tablet Take 25-50 mcg by mouth See admin instructions. Take 25 mcg daily except on Saturday and Sunday take 50 mcg    . metoprolol (LOPRESSOR) 25 MG tablet Take 0.5 tablets (12.5 mg total) by mouth 2 (two) times daily. 30 tablet 1  . mirtazapine (REMERON) 15 MG tablet Take 15 mg by mouth at bedtime.    Marland Kitchen omeprazole (PRILOSEC) 20 MG capsule Take 20 mg by mouth daily.    . pentoxifylline (TRENTAL) 400 MG CR tablet Take 400 mg by mouth 2 (two) times daily.     . sertraline (ZOLOFT) 100 MG tablet Take 100 mg by mouth daily.    Marland Kitchen thiamine (VITAMIN B-1) 100 MG tablet Take 100 mg by mouth daily.    Marland Kitchen triamcinolone cream (KENALOG) 0.1 % Apply 1 application topically daily.    Marland Kitchen warfarin (COUMADIN) 2.5 MG tablet Take 2.5 mg by mouth See admin instructions. Take only on Tuesday, Wednesday,thursday,saturday,sunday.    Dema Severin Petrolatum-Mineral Oil (ARTIFICIAL TEARS) OINT ophthalmic ointment Place 1 application into both eyes every evening.    Marland Kitchen oseltamivir (TAMIFLU) 75 MG capsule Take 1 capsule (75 mg total) by mouth 2 (two) times daily. (Patient not taking: Reported on 03/06/2016) 10 capsule 0    Results for orders placed or performed during the hospital encounter of 03/06/16 (from the past 48 hour(s))  I-stat troponin, ED     Status: None   Collection Time: 03/06/16 12:33 PM  Result Value Ref Range   Troponin i, poc 0.04 0.00 - 0.08 ng/mL   Comment 3             Comment: Due to the release kinetics of cTnI, a negative result within the first hours of the onset of symptoms does not rule out myocardial infarction with certainty. If myocardial infarction is still suspected, repeat the test at appropriate intervals.   I-stat Chem 8, ED     Status: Abnormal   Collection  Time: 03/06/16 12:35 PM  Result Value Ref Range   Sodium 143 135 - 145 mmol/L   Potassium 3.6 3.5 - 5.1 mmol/L   Chloride 103 101 - 111 mmol/L   BUN 91 (H) 6 - 20 mg/dL   Creatinine, Ser 3.60 (H) 0.61 - 1.24 mg/dL   Glucose, Bld 88 65 - 99 mg/dL   Calcium, Ion 1.20 1.15 - 1.40 mmol/L   TCO2 27 0 - 100 mmol/L   Hemoglobin 9.9 (L) 13.0 - 17.0 g/dL   HCT 29.0 (L) 39.0 - 52.0 %  CBC with Differential     Status: Abnormal   Collection Time: 03/06/16 12:36 PM  Result Value Ref Range   WBC 7.6 4.0 - 10.5 K/uL   RBC 3.02 (L) 4.22 - 5.81 MIL/uL   Hemoglobin 8.6 (L) 13.0 - 17.0 g/dL   HCT 29.5 (L) 39.0 - 52.0 %   MCV 97.7 78.0 - 100.0 fL   MCH 28.5 26.0 - 34.0 pg   MCHC 29.2 (L) 30.0 - 36.0 g/dL   RDW 19.1 (H) 11.5 - 15.5 %   Platelets 181 150 - 400 K/uL   Neutrophils Relative % 75 %   Neutro Abs 5.6 1.7 - 7.7 K/uL   Lymphocytes Relative 20 %   Lymphs Abs 1.5 0.7 - 4.0 K/uL   Monocytes Relative 5 %   Monocytes Absolute 0.4 0.1 - 1.0 K/uL   Eosinophils Relative 0 %   Eosinophils Absolute 0.0 0.0 - 0.7 K/uL   Basophils Relative 0 %   Basophils Absolute 0.0 0.0 - 0.1 K/uL  Hepatic function panel     Status: Abnormal   Collection Time: 03/06/16 12:36 PM  Result Value Ref Range   Total Protein 7.8 6.5 - 8.1 g/dL   Albumin 2.8 (L) 3.5 - 5.0 g/dL   AST 40 15 - 41 U/L   ALT 63 17 - 63 U/L   Alkaline Phosphatase 90 38 - 126 U/L   Total Bilirubin 0.8 0.3 - 1.2 mg/dL   Bilirubin, Direct 0.3 0.1 - 0.5 mg/dL   Indirect Bilirubin 0.5 0.3 - 0.9 mg/dL  Brain natriuretic peptide     Status: Abnormal   Collection Time: 03/06/16 12:36 PM  Result Value Ref Range   B Natriuretic Peptide  709.0 (H) 0.0 - 100.0 pg/mL  D-dimer, quantitative     Status: None   Collection Time: 03/06/16 12:36 PM  Result Value Ref Range   D-Dimer, Quant 0.32 0.00 - 0.50 ug/mL-FEU    Comment: (NOTE) At the manufacturer cut-off of 0.50 ug/mL FEU, this assay has been documented to exclude PE with a sensitivity and negative predictive value of 97 to 99%.  At this time, this assay has not been approved by the FDA to exclude DVT/VTE. Results should be correlated with clinical presentation.   Protime-INR     Status: Abnormal   Collection Time: 03/06/16 12:36 PM  Result Value Ref Range   Prothrombin Time 44.6 (H) 11.4 - 15.2 seconds   INR 4.53 (HH)     Comment: CRITICAL RESULT CALLED TO, READ BACK BY AND VERIFIED WITH: VOGLER,T AT 1340 BY HFLYNT 03/06/16   Blood gas, arterial     Status: Abnormal   Collection Time: 03/06/16  1:19 PM  Result Value Ref Range   FIO2 0.60    Delivery systems BILEVEL POSITIVE AIRWAY PRESSURE    LHR 12 resp/min   Inspiratory PAP 14    Expiratory PAP 6    pH, Arterial 7.357 7.350 -  7.450   pCO2 arterial 47.6 32.0 - 48.0 mmHg   pO2, Arterial 31.9 (LL) 83.0 - 108.0 mmHg    Comment: RBV TO TIFFANY VOGLER,RN AT 1332 BY WENDY VIA,RRT,RCP ON 03/06/2016   Bicarbonate 24.4 20.0 - 28.0 mmol/L   Acid-Base Excess 1.2 0.0 - 2.0 mmol/L   O2 Saturation 45.7 %   Patient temperature 37.0    Collection site LEFT RADIAL    Drawn by [id number]    Sample type ARTERIAL DRAW    Allens test (pass/fail) PASS PASS  Urinalysis, Routine w reflex microscopic     Status: Abnormal   Collection Time: 03/06/16  2:50 PM  Result Value Ref Range   Color, Urine YELLOW YELLOW   APPearance CLEAR CLEAR   Specific Gravity, Urine 1.011 1.005 - 1.030   pH 5.0 5.0 - 8.0   Glucose, UA NEGATIVE NEGATIVE mg/dL   Hgb urine dipstick SMALL (A) NEGATIVE   Bilirubin Urine NEGATIVE NEGATIVE   Ketones, ur NEGATIVE NEGATIVE mg/dL   Protein, ur NEGATIVE NEGATIVE mg/dL   Nitrite NEGATIVE NEGATIVE   Leukocytes,  UA NEGATIVE NEGATIVE   RBC / HPF 0-5 0 - 5 RBC/hpf   WBC, UA 0-5 0 - 5 WBC/hpf   Bacteria, UA NONE SEEN NONE SEEN   Hyaline Casts, UA PRESENT   Blood gas, arterial ([facility] & [facility] ONLY)     Status: Abnormal   Collection Time: 03/06/16  6:20 PM  Result Value Ref Range   FIO2 0.50    Delivery systems VENTILATOR    Mode BILEVEL POSITIVE AIRWAY PRESSURE    LHR 12 resp/min   Inspiratory PAP 14    Expiratory PAP 6    pH, Arterial 7.404 7.350 - 7.450   pCO2 arterial 40 32.0 - 48.0 mmHg   pO2, Arterial 155 (H) 83.0 - 108.0 mmHg   Bicarbonate 24.7 20.0 - 28.0 mmol/L   Acid-Base Excess 0.3 0.0 - 2.0 mmol/L   O2 Saturation 99.2 %   Patient temperature 37.0    Collection site LEFT RADIAL    Drawn by [id number]    Sample type ARTERIAL DRAW    Allens test (pass/fail) PASS PASS  CBG monitoring, ED     Status: Abnormal   Collection Time: 03/06/16  6:42 PM  Result Value Ref Range   Glucose-Capillary 150 (H) 65 - 99 mg/dL  Lactic acid, plasma     Status: None   Collection Time: 03/06/16  6:45 PM  Result Value Ref Range   Lactic Acid, Venous 1.6 0.5 - 1.9 mmol/L  Culture, blood (Routine X 2) w Reflex to ID Panel     Status: None (Preliminary result)   Collection Time: 03/06/16  6:45 PM  Result Value Ref Range   Specimen Description RIGHT ANTECUBITAL    Special Requests BOTTLES DRAWN AEROBIC ONLY 6CC    Culture NO GROWTH < 24 HOURS    Report Status PENDING   Culture, blood (Routine X 2) w Reflex to ID Panel     Status: None (Preliminary result)   Collection Time: 03/06/16  6:46 PM  Result Value Ref Range   Specimen Description LEFT ANTECUBITAL    Special Requests BOTTLES DRAWN AEROBIC AND ANAEROBIC 6CC    Culture NO GROWTH < 24 HOURS    Report Status PENDING   MRSA PCR Screening     Status: None   Collection Time: 03/06/16  9:41 PM  Result Value Ref Range   MRSA by PCR NEGATIVE NEGATIVE    Comment:  The GeneXpert MRSA Assay (FDA approved for NASAL specimens only), is one component of  a comprehensive MRSA colonization surveillance program. It is not intended to diagnose MRSA infection nor to guide or monitor treatment for MRSA infections.   Glucose, capillary     Status: Abnormal   Collection Time: 03/06/16 10:23 PM  Result Value Ref Range   Glucose-Capillary 196 (H) 65 - 99 mg/dL  Lactic acid, plasma     Status: Abnormal   Collection Time: 03/06/16 10:32 PM  Result Value Ref Range   Lactic Acid, Venous 2.4 (HH) 0.5 - 1.9 mmol/L    Comment: CRITICAL RESULT CALLED TO, READ BACK BY AND VERIFIED WITH: BARNHILL S,RN 03/06/16 2341 WAYK   Creatinine, serum     Status: Abnormal   Collection Time: 03/06/16 10:32 PM  Result Value Ref Range   Creatinine, Ser 3.53 (H) 0.61 - 1.24 mg/dL   GFR calc non Af Amer 16 (L) >60 mL/min   GFR calc Af Amer 19 (L) >60 mL/min    Comment: (NOTE) The eGFR has been calculated using the CKD EPI equation. This calculation has not been validated in all clinical situations. eGFR's persistently <60 mL/min signify possible Chronic Kidney Disease.   Blood gas, arterial     Status: Abnormal   Collection Time: 03/07/16  2:50 AM  Result Value Ref Range   FIO2 40.00    Delivery systems BILEVEL POSITIVE AIRWAY PRESSURE    Inspiratory PAP 10    Expiratory PAP 5.0    pH, Arterial 7.393 7.350 - 7.450   pCO2 arterial 42.0 32.0 - 48.0 mmHg   pO2, Arterial 123 (H) 83.0 - 108.0 mmHg   Bicarbonate 25.0 20.0 - 28.0 mmol/L   Acid-Base Excess 0.7 0.0 - 2.0 mmol/L   O2 Saturation 98.7 %   Patient temperature 98.6    Collection site LEFT RADIAL    Drawn by [phone]    Sample type ARTERIAL DRAW    Allens test (pass/fail) PASS PASS  Lactic acid, plasma     Status: None   Collection Time: 03/07/16  3:22 AM  Result Value Ref Range   Lactic Acid, Venous 1.7 0.5 - 1.9 mmol/L  CBC     Status: Abnormal   Collection Time: 03/07/16  3:22 AM  Result Value Ref Range   WBC 5.6 4.0 - 10.5 K/uL   RBC 3.14 (L) 4.22 - 5.81 MIL/uL   Hemoglobin 9.0 (L) 13.0 -  17.0 g/dL   HCT 30.0 (L) 39.0 - 52.0 %   MCV 95.5 78.0 - 100.0 fL   MCH 28.7 26.0 - 34.0 pg   MCHC 30.0 30.0 - 36.0 g/dL   RDW 19.6 (H) 11.5 - 15.5 %   Platelets 156 150 - 400 K/uL  Comprehensive metabolic panel     Status: Abnormal   Collection Time: 03/07/16  3:22 AM  Result Value Ref Range   Sodium 142 135 - 145 mmol/L   Potassium 4.0 3.5 - 5.1 mmol/L   Chloride 104 101 - 111 mmol/L   CO2 25 22 - 32 mmol/L   Glucose, Bld 209 (H) 65 - 99 mg/dL   BUN 85 (H) 6 - 20 mg/dL   Creatinine, Ser 3.50 (H) 0.61 - 1.24 mg/dL   Calcium 9.1 8.9 - 10.3 mg/dL   Total Protein 7.3 6.5 - 8.1 g/dL   Albumin 2.5 (L) 3.5 - 5.0 g/dL   AST 32 15 - 41 U/L   ALT 50 17 - 63 U/L  Alkaline Phosphatase 78 38 - 126 U/L   Total Bilirubin 1.2 0.3 - 1.2 mg/dL   GFR calc non Af Amer 16 (L) >60 mL/min   GFR calc Af Amer 19 (L) >60 mL/min    Comment: (NOTE) The eGFR has been calculated using the CKD EPI equation. This calculation has not been validated in all clinical situations. eGFR's persistently <60 mL/min signify possible Chronic Kidney Disease.    Anion gap 13 5 - 15  Glucose, capillary     Status: Abnormal   Collection Time: 03/07/16  7:33 AM  Result Value Ref Range   Glucose-Capillary 170 (H) 65 - 99 mg/dL  Glucose, capillary     Status: Abnormal   Collection Time: 03/07/16 11:25 AM  Result Value Ref Range   Glucose-Capillary 204 (H) 65 - 99 mg/dL   Ct Head Wo Contrast  Result Date: 03/06/2016 CLINICAL DATA:  Lethargy worsening shortness of breath EXAM: CT HEAD WITHOUT CONTRAST TECHNIQUE: Contiguous axial images were obtained from the base of the skull through the vertex without intravenous contrast. COMPARISON:  05/31/2011 FINDINGS: Brain: Motion artifact limits the exam. No gross territorial infarction, hemorrhage or mass is visualized. 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 Old appearing infarct in the left cerebellum. Moderate atrophy. Mild periventricular  white matter hypodensity consistent with small vessel disease. Vascular: No hyperdense vessels.  Carotid artery calcifications. Skull: Mastoid air cells grossly clear.  No obvious skull fracture Sinuses/Orbits: Minimal mucosal thickening within the ethmoid sinuses. No acute orbital abnormality. Other: None IMPRESSION: No CT evidence for acute intracranial abnormality allowing for motion artifact. 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 Electronically Signed   By: Donavan Foil M.D.   On: 03/06/2016 20:07   Dg Chest Port 1 View  Result Date: 03/06/2016 CLINICAL DATA:  Shortness of breath. EXAM: PORTABLE CHEST 1 VIEW COMPARISON:  01/19/2016.  11/14/2015. FINDINGS: Cardiac pacer with lead tip over the right atrium right ventricle. Cardiomegaly with bilateral pulmonary infiltrates consistent pulmonary edema noted. Persistent right upper lobe infiltrate consistent with pneumonia. IMPRESSION: 1. Cardiac pacer with lead tips in right atrium right ventricle. 2. Cardiomegaly with bilateral pulmonary infiltrates consistent with congestive heart failure again noted. 3. Persistent right upper lobe infiltrate consistent with pneumonia . No interim improvement from prior exam. Continued close follow-up exams to demonstrate clearing suggested. Electronically Signed   By: Marcello Moores  Register   On: 03/06/2016 12:41    ROS : reporting SOB, otherwise negative except as described in HPI  Blood pressure 122/63, pulse 71, temperature 97.3 F (36.3 C), temperature source Axillary, resp. rate 16, height '5\' 11"'$  (1.803 m), weight 110.5 kg (243 lb 9.7 oz), SpO2 96 %. Physical Exam  GEN: lying in bed, on BiPaP, some resp distress HEENT: sclerae anicteric NECK + JVD to angle of mandible PULM: diffuse rhonchi and wheezing CV tachy ABD obese, soft EXT 1+ LE edema NEURO AAO x 2, somewhat sleepy but arousable and responsive to  questions  Assessment/Plan  1.  SOB/ pulm edema/ vol overload: has made 1.2L of urine.  Continue to diurese with high-dose lasix and adding metolazone to augment.  BID electrolytes.  If no good response in 24-48 hrs may require HD again.  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  Had a fistula placed which is maturing  3.  LUL pneumonia: vanc/ Zosyn  4.  Acute hypoxic respiratory failure: due to #s 1 and 3 as above.  Continue BiPaP.  CCM consulted.  5.  HTN: pressures Ok, on metop   Madelon Lips MD 03/07/2016, 1:07 PM

## 2016-03-07 NOTE — Progress Notes (Signed)
ANTICOAGULATION CONSULT NOTE  Pharmacy Consult for warfarin Indication: atrial fibrillation   Assessment: 70 yom on warfarin PTA for afib. Pharmacy consulted to dose inpatient. INR 4.53 on admit, now up to 4.91 after holding last night. CBC stable, CT head negative for bleed, hematuria reported. Last warfarin dose PTA 1/17.   Goal of Therapy:  INR 2-3 Monitor platelets by anticoagulation protocol: Yes   Plan:  Hold warfarin tonight Daily INR Monitor CBC, s/sx bleeding   Babs BertinHaley Aurea Aronov, PharmD, BCPS Clinical Pharmacist 03/07/2016 11:16 AM

## 2016-03-07 NOTE — Consult Note (Signed)
PULMONARY / CRITICAL CARE MEDICINE   Name: [name] MRN: [id number] DOB: [birth year]    ADMISSION DATE:  03/06/2016 CONSULTATION DATE:  1/19  REFERRING MD:  [facility]  CHIEF COMPLAINT:  AMS  HISTORY OF PRESENT ILLNESS:   71 yo obese(243lbs) former smoker(quit 8 years ago)with known LBB(PPM) PAF(on BB and coumadin) and CRF(base cret 3.5) who was admitted 1/18 with CC of SOB. He was admitted 12/17 and required HD vvia temp cath that was removed recently. His CxR shows diffuse edema and persistent rul infiltrate cw pna. PCCM was called 1/19 for AMS and resp failure requiring NIMVS. He was awake and alert, aggressive diuresis had been instituted and renal called. He does not need intubation at this time(may in future) and may need another HD cath as his rt av graft is not mature. We will leave him in SDU but if worsens we will move to ICU.   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  PAST SURGICAL HISTORY: He  has a past surgical history that includes permanent pacemaker insertion (N/A, 08/03/2012); Cardioversion (N/A, 01/03/2016); AV fistula placement (Right, 01/28/2016); and Insertion of dialysis catheter (Right, 01/28/2016).  Allergies  Allergen Reactions  . No Known Allergies     No current facility-administered medications on file prior to encounter.    Current Outpatient Prescriptions on File Prior to Encounter  Medication Sig  . albuterol-ipratropium (COMBIVENT) 18-103 MCG/ACT  inhaler Inhale 1 puff into the lungs 4 (four) times daily.  Marland Kitchen atorvastatin (LIPITOR) 80 MG tablet Take 40 mg by mouth at bedtime.  . Cholecalciferol (VITAMIN D) 2000 UNITS CAPS Take 2,000 Units by mouth daily.  . ferrous sulfate 325 (65 FE) MG tablet Take 325 mg by mouth 2 (two) times daily.  . fish oil-omega-3 fatty acids 1000 MG capsule Take 1 g by mouth 2 (two) times daily.  . folic acid (FOLVITE) 1 MG tablet Take 1 mg by mouth daily.  . furosemide (LASIX) 80 MG tablet Take 2 tablets (160 mg total) by mouth 2 (two) times daily.  . insulin aspart (NOVOLOG) 100 UNIT/ML injection Inject 20 Units into the skin 3 (three) times daily before meals.  . insulin glargine (LANTUS) 100 UNIT/ML injection Inject 52 Units into the skin at bedtime.   Marland Kitchen ketotifen (ZADITOR) 0.025 % ophthalmic solution Place 1 drop into both eyes 2 (two) times daily as needed.  Marland Kitchen levothyroxine (SYNTHROID, LEVOTHROID) 25 MCG tablet Take 25-50 mcg by mouth See admin instructions. Take 25 mcg daily except on Saturday and Sunday take 50 mcg  . metoprolol (LOPRESSOR) 25 MG tablet Take 0.5 tablets (12.5 mg total) by mouth 2 (two) times daily.  . mirtazapine (REMERON) 15 MG tablet Take 15 mg by mouth at bedtime.  Marland Kitchen omeprazole (PRILOSEC) 20 MG capsule Take 20 mg by mouth daily.  . pentoxifylline (TRENTAL) 400 MG CR tablet Take 400 mg by mouth 2 (two) times daily.   . sertraline (ZOLOFT) 100 MG tablet Take 100 mg by mouth daily.  Marland Kitchen thiamine (VITAMIN B-1) 100 MG tablet Take 100 mg by mouth daily.  Marland Kitchen warfarin (COUMADIN) 2.5 MG tablet Take 2.5 mg  by mouth See admin instructions. Take only on Tuesday, Wednesday,thursday,saturday,sunday.  Cliffton Asters Petrolatum-Mineral Oil (ARTIFICIAL TEARS) OINT ophthalmic ointment Place 1 application into both eyes every evening.  Marland Kitchen oseltamivir (TAMIFLU) 75 MG capsule Take 1 capsule (75 mg total) by mouth 2 (two) times daily. (Patient not taking: Reported on 03/06/2016)    FAMILY HISTORY:  His has no family  status information on file.    SOCIAL HISTORY: He  reports that he has quit smoking. He has never used smokeless tobacco. He reports that he does not drink alcohol or use drugs.  REVIEW OF SYSTEMS:   10 point review of system taken, please see HPI for positives and negatives.   SUBJECTIVE:  Awake and cooperative  VITAL SIGNS: BP (!) 146/72   Pulse 60   Temp 97.5 F (36.4 C) (Axillary)   Resp 16   Ht 5\' 11"  (1.803 m)   Wt 243 lb 9.7 oz (110.5 kg)   SpO2 99%   BMI 33.98 kg/m   HEMODYNAMICS:    VENTILATOR SETTINGS: FiO2 (%):  [50 %] 50 %  INTAKE / OUTPUT: I/O last 3 completed shifts: In: 675 [I.V.:65; IV Piggyback:610] Out: 1225 [Urine:1225]  PHYSICAL EXAMINATION: General:  MOWM Neuro:Slow but interactive HEENT: No JVD/LAN Cardiovascular:  HSR RRR Lungs:  Congested cough, Abd chest wall paradoxus Abdomen: Obese + bs Musculoskeletal: intact Skin:  Scars from self scratching , rt ra av graft intact  LABS:  BMET  Recent Labs Lab 03/06/16 1235 03/06/16 2232 03/07/16 0322  NA 143  --  142  K 3.6  --  4.0  CL 103  --  104  CO2  --   --  25  BUN 91*  --  85*  CREATININE 3.60* 3.53* 3.50*  GLUCOSE 88  --  209*    Electrolytes  Recent Labs Lab 03/07/16 0322  CALCIUM 9.1    CBC  Recent Labs Lab 03/06/16 1235 03/06/16 1236 03/07/16 0322  WBC  --  7.6 5.6  HGB 9.9* 8.6* 9.0*  HCT 29.0* 29.5* 30.0*  PLT  --  181 156    Coag's  Recent Labs Lab 03/06/16 1236  INR 4.53*    Sepsis Markers  Recent Labs Lab 03/06/16 1845 03/06/16 2232 03/07/16 0322  LATICACIDVEN 1.6 2.4* 1.7    ABG  Recent Labs Lab 03/06/16 1319 03/06/16 1820 03/07/16 0250  PHART 7.357 7.404 7.393  PCO2ART 47.6 40 42.0  PO2ART 31.9* 155* 123*    Liver Enzymes  Recent Labs Lab 03/06/16 1236 03/07/16 0322  AST 40 32  ALT 63 50  ALKPHOS 90 78  BILITOT 0.8 1.2  ALBUMIN 2.8* 2.5*    Cardiac Enzymes No results for input(s): TROPONINI, PROBNP in the  last 168 hours.  Glucose  Recent Labs Lab 03/06/16 2223 03/07/16 0733  GLUCAP 196* 170*    Imaging Ct Head Wo Contrast  Result Date: 03/06/2016 CLINICAL DATA:  Lethargy worsening shortness of breath EXAM: CT HEAD WITHOUT CONTRAST TECHNIQUE: Contiguous axial images were obtained from the base of the skull through the vertex without intravenous contrast. COMPARISON:  05/31/2011 FINDINGS: Brain: Motion artifact limits the exam. No gross territorial infarction, hemorrhage or mass is visualized. 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 Old appearing infarct in the left cerebellum. Moderate atrophy. Mild periventricular white matter hypodensity consistent with small vessel disease. Vascular: No hyperdense vessels.  Carotid artery calcifications. Skull: Mastoid air cells grossly clear.  No obvious skull fracture Sinuses/Orbits: Minimal mucosal  thickening within the ethmoid sinuses. No acute orbital abnormality. Other: None IMPRESSION: No CT evidence for acute intracranial abnormality allowing for motion artifact. 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 Electronically Signed   By: Jasmine PangKim  Fujinaga M.D.   On: 03/06/2016 20:07   Dg Chest Port 1 View  Result Date: 03/06/2016 CLINICAL DATA:  Shortness of breath. EXAM: PORTABLE CHEST 1 VIEW COMPARISON:  01/19/2016.  11/14/2015. FINDINGS: Cardiac pacer with lead tip over the right atrium right ventricle. Cardiomegaly with bilateral pulmonary infiltrates consistent pulmonary edema noted. Persistent right upper lobe infiltrate consistent with pneumonia. IMPRESSION: 1. Cardiac pacer with lead tips in right atrium right ventricle. 2. Cardiomegaly with bilateral pulmonary infiltrates consistent with congestive heart failure again noted. 3. Persistent right upper lobe infiltrate consistent with pneumonia . No interim improvement  from prior exam. Continued close follow-up exams to demonstrate clearing suggested. Electronically Signed   By: Maisie Fushomas  Register   On: 03/06/2016 12:41     STUDIES:    CULTURES: 1/18 bc>>  ANTIBIOTICS: 1/18 vanc>> 1/18 Zoysn>>   SIGNIFICANT EVENTS:   LINES/TUBES: 12/11 rt av graft>>  DISCUSSION: 71 yo obese(243lbs) former smoker(quit 8 years ago)with known LBB(PPM) PAF(on BB and coumadin) and CRF(base cret 3.5) who was admitted 1/18 with CC of SOB. He was admitted 12/17 and required HD vvia temp cath that was removed recently. His CxR shows diffuse edema and persistent rul infiltrate cw pna. PCCM was called 1/19 for AMS and resp failure requiring NIMVS. He was awake and alert, aggressive diuresis had been instituted and renal called. He does not need intubation at this time(may in future) and may need another HD cath as his rt av graft is not mature. We will leave him in SDU but if worsens we will move to ICU.  ASSESSMENT / PLAN:  PULMONARY A: Hypoxia Suspected OSA RUL Pna COPD MO AMS  P:   O2 as needed BD's ABX Pulmonary toilet PRN bipap May need intubation if unable to remove fluid and /o pna worsens  CARDIOVASCULAR A:  CHF PAF CHF PPM P:  Diuresis as tolerated BB  RENAL Lab Results  Component Value Date   CREATININE 3.50 (H) 03/07/2016   CREATININE 3.53 (H) 03/06/2016   CREATININE 3.60 (H) 03/06/2016    A:   Chronic renal failure with recent HD cath and HD 12/17 HD recently removed Immature AV graft P:   Agressive diuresis Renal consult May need temp HD cath for volume removal if fails diuresis  GASTROINTESTINAL A:   MO GI protection P:   PPI  HEMATOLOGIC Lab Results  Component Value Date   INR 4.53 (HH) 03/06/2016   INR 2.54 02/01/2016   INR 2.09 01/31/2016    A:   Anemia Elevated  INR on coumadin for PAF P:  Hold coumadin per pharmacy  INFECTIOUS A:    Persistent RUL infiltrate , presumed pna P:   Abx as ordered  V/Z Follow any culture data  ENDOCRINE CBG (last 3)   Recent Labs  03/06/16 2223 03/07/16 0733  GLUCAP 196* 170*   Hypothyroidism   A:   DM  P:   SSI Synthroid  NEUROLOGIC A:   Periods of lethargy with ni CT scan and no hypercarbia  P:   RASS goal: 0 Hold all sedating agents   FAMILY  - Updates: Pt updated at bedside, no family.  - Inter-disciplinary family meet or Palliative Care  meeting due by:  day 7   CCT 45 min    Brett Canales Minor ACNP Adolph Pollack PCCM Pager [phone] till 3 pm If no answer page [phone] 03/07/2016, 8:41 AM

## 2016-03-07 NOTE — Progress Notes (Signed)
Lab called with Critical INR value of 4.91, text paged Dr. Shirlee Latchealado value, spoke with Pharmacist Babs BertinHaley Baird, she is holding pt coumadin and monitoring daily.

## 2016-03-07 NOTE — Progress Notes (Signed)
Dr. Robb Matarrtiz updated on pt's status.Pt lethargic but arousable. Following commands. VSS. Lactic Acid Level of 2.4 New orders received.

## 2016-03-08 ENCOUNTER — Inpatient Hospital Stay (HOSPITAL_COMMUNITY): Payer: Non-veteran care

## 2016-03-08 DIAGNOSIS — J9620 Acute and chronic respiratory failure, unspecified whether with hypoxia or hypercapnia: Secondary | ICD-10-CM

## 2016-03-08 LAB — CBC
HEMATOCRIT: 27.6 % — AB (ref 39.0–52.0)
Hemoglobin: 8.3 g/dL — ABNORMAL LOW (ref 13.0–17.0)
MCH: 28.3 pg (ref 26.0–34.0)
MCHC: 30.1 g/dL (ref 30.0–36.0)
MCV: 94.2 fL (ref 78.0–100.0)
PLATELETS: 164 10*3/uL (ref 150–400)
RBC: 2.93 MIL/uL — ABNORMAL LOW (ref 4.22–5.81)
RDW: 19.4 % — AB (ref 11.5–15.5)
WBC: 8.4 10*3/uL (ref 4.0–10.5)

## 2016-03-08 LAB — RENAL FUNCTION PANEL
Albumin: 2.3 g/dL — ABNORMAL LOW (ref 3.5–5.0)
Albumin: 2.3 g/dL — ABNORMAL LOW (ref 3.5–5.0)
Anion gap: 18 — ABNORMAL HIGH (ref 5–15)
Anion gap: 17 — ABNORMAL HIGH (ref 5–15)
BUN: 98 mg/dL — AB (ref 6–20)
BUN: 99 mg/dL — ABNORMAL HIGH (ref 6–20)
CHLORIDE: 99 mmol/L — AB (ref 101–111)
CHLORIDE: 101 mmol/L (ref 101–111)
CO2: 18 mmol/L — ABNORMAL LOW (ref 22–32)
CO2: 18 mmol/L — AB (ref 22–32)
Calcium: 9 mg/dL (ref 8.9–10.3)
Calcium: 9.1 mg/dL (ref 8.9–10.3)
Creatinine, Ser: 3.65 mg/dL — ABNORMAL HIGH (ref 0.61–1.24)
Creatinine, Ser: 3.77 mg/dL — ABNORMAL HIGH (ref 0.61–1.24)
GFR calc Af Amer: 18 mL/min — ABNORMAL LOW (ref >60)
GFR calc Af Amer: 17 mL/min — ABNORMAL LOW (ref >60)
GFR calc non Af Amer: 16 mL/min — ABNORMAL LOW (ref >60)
GFR calc non Af Amer: 15 mL/min — ABNORMAL LOW (ref >60)
GLUCOSE: 345 mg/dL — AB (ref 65–99)
GLUCOSE: 334 mg/dL — AB (ref 65–99)
POTASSIUM: 3.9 mmol/L (ref 3.5–5.1)
POTASSIUM: 4 mmol/L (ref 3.5–5.1)
Phosphorus: 4.7 mg/dL — ABNORMAL HIGH (ref 2.5–4.6)
Phosphorus: 4.5 mg/dL (ref 2.5–4.6)
SODIUM: 136 mmol/L (ref 135–145)
Sodium: 135 mmol/L (ref 135–145)

## 2016-03-08 LAB — GLUCOSE, CAPILLARY
GLUCOSE-CAPILLARY: 347 mg/dL — AB (ref 65–99)
GLUCOSE-CAPILLARY: 290 mg/dL — AB (ref 65–99)
Glucose-Capillary: 354 mg/dL — ABNORMAL HIGH (ref 65–99)
Glucose-Capillary: 321 mg/dL — ABNORMAL HIGH (ref 65–99)

## 2016-03-08 LAB — HEMOGLOBIN A1C
Hgb A1c MFr Bld: 6 % — ABNORMAL HIGH (ref 4.8–5.6)
Mean Plasma Glucose: 126 mg/dL

## 2016-03-08 LAB — PROTIME-INR
INR: 5.55
PROTHROMBIN TIME: 52 s — AB (ref 11.4–15.2)

## 2016-03-08 MED ORDER — METOLAZONE 5 MG PO TABS
10.0000 mg | ORAL_TABLET | ORAL | Status: DC
  Administered 2016-03-08 – 2016-03-11 (×7): 10 mg via ORAL
  Filled 2016-03-08 (×8): qty 2

## 2016-03-08 MED ORDER — INSULIN GLARGINE 100 UNIT/ML ~~LOC~~ SOLN
20.0000 [IU] | Freq: Every day | SUBCUTANEOUS | Status: DC
  Administered 2016-03-08 – 2016-03-10 (×3): 20 [IU] via SUBCUTANEOUS
  Filled 2016-03-08 (×6): qty 0.2

## 2016-03-08 NOTE — Progress Notes (Addendum)
Paged Dr. Sunnie Nielsenegalado with patient lab result for his INR of 5.5. MD placed a order for a CBC to be drawn. Will continue to monitor.

## 2016-03-08 NOTE — Progress Notes (Signed)
Pt wanted to attempt a trial off bipap. I attempted & pt had increased WOB & wa placed back on Bipap. RN aware

## 2016-03-08 NOTE — Progress Notes (Signed)
PULMONARY / CRITICAL CARE MEDICINE   Name: [name] MRN: [id number] DOB: Dec 25, 1945    ADMISSION DATE:  03/06/2016 CONSULTATION DATE:  1/19  REFERRING MD:  [facility]  CHIEF COMPLAINT:  AMS  HISTORY OF PRESENT ILLNESS:   71 yo obese(243lbs) former smoker(quit 8 years ago)with known LBB(PPM) PAF(on BB and coumadin) and CRF(base cret 3.5) who was admitted 1/18 with CC of SOB. He was admitted 12/17 and required HD vvia temp cath that was removed recently. His CxR shows diffuse edema and persistent rul infiltrate cw pna. PCCM was called 1/19 for AMS and resp failure requiring NIMVS. He was awake and alert, aggressive diuresis had been instituted and renal called. He does not need intubation at this time (may in future) and may need another HD cath as his rt av graft is not mature. We will leave him in SDU but if worsens we will move to ICU.    SUBJECTIVE:  Awake, NAD on bipap.  Feeling better.  Continues to diurese.    VITAL SIGNS: BP 134/64 (BP Location: Left Arm)   Pulse 71   Temp 97.9 F (36.6 C) (Axillary)   Resp 18   Ht 5\' 11"  (1.803 m)   Wt 110.5 kg (243 lb 9.7 oz)   SpO2 99%   BMI 33.98 kg/m   HEMODYNAMICS:    VENTILATOR SETTINGS: FiO2 (%):  [40 %] 40 %  INTAKE / OUTPUT: I/O last 3 completed shifts: In: 1161 [P.O.:240; I.V.:155; IV Piggyback:766] Out: 2125 [Urine:2125]  PHYSICAL EXAMINATION: General:  Chronically ill appearing male, NAD  Neuro:  Flat affect, awake on bipap, appropriate, MAE  HEENT: No JVD/LAN Cardiovascular:  s1s2 rrr Lungs: resps even non labored on bipap, exp wheeze, few scattered rhonchi Abdomen: soft, +bs  Musculoskeletal: warm and dry, scant BLE edema   LABS:  BMET  Recent Labs Lab 03/06/16 1235 03/06/16 2232 03/07/16 0322 03/08/16 0955  NA 143  --  142 135  K 3.6  --  4.0 3.9  CL 103  --  104 99*  CO2  --   --  25 18*  BUN 91*  --  85* 98*  CREATININE 3.60* 3.53* 3.50* 3.65*  GLUCOSE 88  --  209* 345*     Electrolytes  Recent Labs Lab 03/07/16 0322 03/08/16 0955  CALCIUM 9.1 9.0  PHOS  --  4.7*    CBC  Recent Labs Lab 03/06/16 1236 03/07/16 0322 03/08/16 0955  WBC 7.6 5.6 8.4  HGB 8.6* 9.0* 8.3*  HCT 29.5* 30.0* 27.6*  PLT 181 156 164    Coag's  Recent Labs Lab 03/06/16 1236 03/07/16 1232 03/08/16 0317  INR 4.53* 4.91* 5.55*    Sepsis Markers  Recent Labs Lab 03/06/16 1845 03/06/16 2232 03/07/16 0322 03/07/16 1232  LATICACIDVEN 1.6 2.4* 1.7  --   PROCALCITON  --   --   --  0.22    ABG  Recent Labs Lab 03/06/16 1319 03/06/16 1820 03/07/16 0250  PHART 7.357 7.404 7.393  PCO2ART 47.6 40 42.0  PO2ART 31.9* 155* 123*    Liver Enzymes  Recent Labs Lab 03/06/16 1236 03/07/16 0322 03/08/16 0955  AST 40 32  --   ALT 63 50  --   ALKPHOS 90 78  --   BILITOT 0.8 1.2  --   ALBUMIN 2.8* 2.5* 2.3*    Cardiac Enzymes No results for input(s): TROPONINI, PROBNP in the last 168 hours.  Glucose  Recent Labs Lab 03/07/16 0733 03/07/16 1125 03/07/16 1622  03/07/16 2023 03/08/16 0741 03/08/16 1118  GLUCAP 170* 204* 291* 324* 347* 354*    Imaging No results found.   STUDIES:    CULTURES: 1/18 bc>>  ANTIBIOTICS: 1/18 vanc>> 1/18 Zoysn>>   SIGNIFICANT EVENTS:   LINES/TUBES: 12/11 rt av graft>>  DISCUSSION: 71 yo obese(243lbs) former smoker(quit 8 years ago)with known LBB(PPM) PAF(on BB and coumadin) and CRF(base cret 3.5) who was admitted 1/18 with CC of SOB. He was admitted 12/17 and required HD vvia temp cath that was removed recently. His CxR shows diffuse edema and persistent rul infiltrate cw pna. PCCM was called 1/19 for AMS and resp failure requiring NIMVS. He was awake and alert, aggressive diuresis had been instituted and renal called. He does not need intubation at this time(may in future) and may need another HD cath as his rt av graft is not mature. We will leave him in SDU but if worsens we will move to  ICU.  ASSESSMENT / PLAN:   Acute respiratory failure with hypoxia - multifactorial in setting acute on chronic dCHF, ?PNA.  Improving slowly with diuresis, abx.   Suspect OSA  COPD   P:   Continue diuresis per renal - may need HD if no further improvement  Continue bipap PRN and qhs  duonebs q6h  Continue broad spectrum abx  Pulmonary hygiene  F/u CXR now   Chronic renal failure with recent HD cath and HD 12/17 HD recently removed Immature AV graft P:   Continue aggressive diuresis per renal  May need HD - avoiding for now - would need HD cath as graft is not mature  F.u chem    FAMILY  - Updates: Pt updated at bedside, no family 1/20.  - Inter-disciplinary family meet or Palliative Care meeting due by:  day 7   PCCM will f/u Monday 1/22, please call sooner if needed.    Dirk DressKaty Alisan Dokes, NP 03/08/2016  2:11 PM Pager: [phone] or [phone]

## 2016-03-08 NOTE — Progress Notes (Signed)
[facility] Progress Note    Assessment/ Plan:   1.  SOB/ pulm edema/ vol overload: has made 1.2L of urine.  Continue to diurese with high-dose lasix, increase metolazone.  BID electrolytes.  If no good response in 24-48 hrs may require HD again.  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  Had a fistula placed which is maturing.  Labs pending today  3.  LUL pneumonia: vanc/ Zosyn  4. Acute hypoxic respiratory failure: due to #s 1 and 3 as above.  Continue BiPaP.  CCM consulted.    5.  HTN: pressures Ok, on metop  Subjective:    Some urine output, was off BiPaP during the day yesterday and had to go back on last night.     Objective:   BP 134/64   Pulse 73   Temp 98 F (36.7 C) (Axillary)   Resp (!) 21   Ht 5\' 11"  (1.803 m)   Wt 110.5 kg (243 lb 9.7 oz)   SpO2 90%   BMI 33.98 kg/m   Intake/Output Summary (Last 24 hours) at 03/08/16 1041 Last data filed at 03/08/16 0900  Gross per 24 hour  Intake              666 ml  Output             1350 ml  Net             -684 ml   Weight change:   Physical Exam: GEN: lying in bed, on BiPaP, appears comfortable and less lethargic than yesterday HEENT: sclerae anicteric NECK + JVD to angle of mandible PULM: diffuse rhonchi and wheezing CV tachy ABD obese, soft EXT 1+ LE edema, somewhat improved NEURO AAO x 3  Imaging: Ct Head Wo Contrast  Result Date: 03/06/2016 CLINICAL DATA:  Lethargy worsening shortness of breath EXAM: CT HEAD WITHOUT CONTRAST TECHNIQUE: Contiguous axial images were obtained from the base of the skull through the vertex without intravenous contrast. COMPARISON:  05/31/2011 FINDINGS: Brain: Motion artifact limits the exam. No gross territorial infarction, hemorrhage or mass is visualized. 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 Old appearing infarct in the left cerebellum. Moderate atrophy. Mild periventricular white matter  hypodensity consistent with small vessel disease. Vascular: No hyperdense vessels.  Carotid artery calcifications. Skull: Mastoid air cells grossly clear.  No obvious skull fracture Sinuses/Orbits: Minimal mucosal thickening within the ethmoid sinuses. No acute orbital abnormality. Other: None IMPRESSION: No CT evidence for acute intracranial abnormality allowing for motion artifact. 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 Electronically Signed   By: Jasmine PangKim  Fujinaga M.D.   On: 03/06/2016 20:07   Dg Chest Port 1 View  Result Date: 03/06/2016 CLINICAL DATA:  Shortness of breath. EXAM: PORTABLE CHEST 1 VIEW COMPARISON:  01/19/2016.  11/14/2015. FINDINGS: Cardiac pacer with lead tip over the right atrium right ventricle. Cardiomegaly with bilateral pulmonary infiltrates consistent pulmonary edema noted. Persistent right upper lobe infiltrate consistent with pneumonia. IMPRESSION: 1. Cardiac pacer with lead tips in right atrium right ventricle. 2. Cardiomegaly with bilateral pulmonary infiltrates consistent with congestive heart failure again noted. 3. Persistent right upper lobe infiltrate consistent with pneumonia . No interim improvement from prior exam. Continued close follow-up exams to demonstrate clearing suggested. Electronically Signed   By: Maisie Fushomas  Register   On: 03/06/2016 12:41  Labs: BMET  Recent Labs Lab 03/06/16 1235 03/06/16 2232 03/07/16 0322  NA 143  --  142  K 3.6  --  4.0  CL 103  --  104  CO2  --   --  25  GLUCOSE 88  --  209*  BUN 91*  --  85*  CREATININE 3.60* 3.53* 3.50*  CALCIUM  --   --  9.1   CBC  Recent Labs Lab 03/06/16 1235 03/06/16 1236 03/07/16 0322  WBC  --  7.6 5.6  NEUTROABS  --  5.6  --   HGB 9.9* 8.6* 9.0*  HCT 29.0* 29.5* 30.0*  MCV  --  97.7 95.5  PLT  --  181 156    Medications:    . furosemide  160 mg Intravenous Q8H  . insulin  aspart  0-9 Units Subcutaneous TID WC  . ipratropium-albuterol  3 mL Nebulization Q6H  . levothyroxine  25 mcg Oral Once per day on Mon Tue Wed Thu Fri  . levothyroxine  50 mcg Oral Once per day on Sun Sat  . metolazone  10 mg Oral 2 times per day  . metoprolol tartrate  12.5 mg Oral BID  . pentoxifylline  400 mg Oral BID  . piperacillin-tazobactam (ZOSYN)  IV  3.375 g Intravenous Q8H  . sertraline  100 mg Oral Daily  . vancomycin  1,500 mg Intravenous Q48H  . Warfarin - Pharmacist Dosing Inpatient   Does not apply Q24H      Bufford Buttner MD 03/08/2016, 10:41 AM

## 2016-03-08 NOTE — Progress Notes (Signed)
ANTICOAGULATION CONSULT NOTE  Pharmacy Consult for warfarin Indication: atrial fibrillation   Waiting for CBC   Assessment: 70 yom on warfarin PTA for afib. Pharmacy consulted to dose inpatient. INR still climbing - 4.53>4.91>5.55, Warfarin PTA for afib. CT head neg for bleeding other than [facility] this am says hematuria is stable since yesterday. Last warfarin dose PTA 1/17.   Goal of Therapy:  INR 2-3 Monitor platelets by anticoagulation protocol: Yes   Plan:  Hold warfarin tonight Daily INR and CBC (while INR rising) Monitor s/sx bleeding  Alfredo BachJoseph Shawnita Krizek, BS, PharmD Clinical Pharmacy Resident [phone] (Pager) 03/08/2016 8:19 AM

## 2016-03-08 NOTE — Progress Notes (Signed)
PROGRESS NOTE    [name]  ZOX:[id number] DOB: 02/04/1946 DOA: 03/06/2016 PCP: Marlowe Shores, MD   Brief Narrative: [name]  is a 71 y.o. male, With a history of chronic diastolic CHF, see daily stage IV, paroxysmal atrial fibrillation on Coumadin, hypertension, diabetes mellitus who was brought to the hospital with worsening shortness of breath. Patient at this time is on BiPAP and unable to point any history. He was somnolent but arousable.   Assessment & Plan:   Principal Problem:   Acute on chronic diastolic CHF (congestive heart failure) (HCC) Active Problems:   IDDM (insulin dependent diabetes mellitus) (HCC)   Hypertensive heart disease   Hypothyroidism   Persistent atrial fibrillation (HCC)   CHF (congestive heart failure) (HCC)   Chronic anticoagulation   Acute respiratory failure (HCC)    1-Acute Hypoxic Respiratory failure;  On BIPAP overnight. On and off BIPAP today.  Continue with IV lasix 160 TID. Metolazone BID>   Patient might need HD.  CCM consulted and following.   2-Acute encephalopathy;  This could be multifactorial, secondary to uremia, respiratory failure and or infection.  Treating possible cause.  Improved.   3-Persistent right upper lobe infiltrate/? Pneumonia- Will need repeat Chest x ray and or CT chest when stable.  Would continue with IV antibiotics at this time.  Lactic acid trending down.   4-Diabetes mellitus- SSI. Resume  lantus.   5-Hypothyroidism- continue Synthroid  6-Hypertension-  continue metoprolol.  7-Atrial fibrillation- heart rate is controlled, patient on Coumadin. Continue metoprolol.   8-Supratherapeutic INR- patient's INR is 5, hold coumadin, supra therapeutic INR and hematuria.   9-Hematuria,; holding  coumadin.    DVT prophylaxis: on coumadin.  Code Status: Full code.  Family Communication: None at bedside.  Disposition Plan: not stable to be discharge.    Consultants:    Nephrology  CCM   Procedures: none  Antimicrobials:   Vancomycin 1-18  Zosyn 1-18   Subjective: He is more alert. Truing to eat breakfast.  Had to use BIPAP overnight due to SOB.    Objective: Vitals:   03/08/16 1009 03/08/16 1251 03/08/16 1300 03/08/16 1441  BP: 134/64     Pulse: 73 71  73  Resp:  18  (!) 23  Temp:  97.9 F (36.6 C)    TempSrc:  Axillary    SpO2: 97% 99% 100% 97%  Weight:      Height:        Intake/Output Summary (Last 24 hours) at 03/08/16 1528 Last data filed at 03/08/16 1511  Gross per 24 hour  Intake             1146 ml  Output             2100 ml  Net             -954 ml   Filed Weights   03/06/16 1904 03/06/16 2141 03/07/16 0600  Weight: 110.7 kg (244 lb) 110.6 kg (243 lb 13.3 oz) 110.5 kg (243 lb 9.7 oz)    Examination:  General exam: alert  Respiratory system: Bilateral Air movement, bilateral crackles, ronchus./  Cardiovascular system: S1 & S2 heard, RRR. No JVD, murmurs, rubs, gallops or clicks. No pedal edema. Gastrointestinal system: Abdomen is nondistended, soft and nontender. No organomegaly or masses felt. Normal bowel sounds heard. Central nervous system: alert. Extremities: moves all extremities.  Skin: No rashes, lesions or ulcers     Data Reviewed: I have personally reviewed following labs and imaging  studies  CBC:  Recent Labs Lab 03/06/16 1235 03/06/16 1236 03/07/16 0322 03/08/16 0955  WBC  --  7.6 5.6 8.4  NEUTROABS  --  5.6  --   --   HGB 9.9* 8.6* 9.0* 8.3*  HCT 29.0* 29.5* 30.0* 27.6*  MCV  --  97.7 95.5 94.2  PLT  --  181 156 164   Basic Metabolic Panel:  Recent Labs Lab 03/06/16 1235 03/06/16 2232 03/07/16 0322 03/08/16 0955  NA 143  --  142 135  K 3.6  --  4.0 3.9  CL 103  --  104 99*  CO2  --   --  25 18*  GLUCOSE 88  --  209* 345*  BUN 91*  --  85* 98*  CREATININE 3.60* 3.53* 3.50* 3.65*  CALCIUM  --   --  9.1 9.0  PHOS  --   --   --  4.7*   GFR: Estimated Creatinine  Clearance: 23.8 mL/min (by C-G formula based on SCr of 3.65 mg/dL (H)). Liver Function Tests:  Recent Labs Lab 03/06/16 1236 03/07/16 0322 03/08/16 0955  AST 40 32  --   ALT 63 50  --   ALKPHOS 90 78  --   BILITOT 0.8 1.2  --   PROT 7.8 7.3  --   ALBUMIN 2.8* 2.5* 2.3*   No results for input(s): LIPASE, AMYLASE in the last 168 hours. No results for input(s): AMMONIA in the last 168 hours. Coagulation Profile:  Recent Labs Lab 03/06/16 1236 03/07/16 1232 03/08/16 0317  INR 4.53* 4.91* 5.55*   Cardiac Enzymes: No results for input(s): CKTOTAL, CKMB, CKMBINDEX, TROPONINI in the last 168 hours. BNP (last 3 results) No results for input(s): PROBNP in the last 8760 hours. HbA1C:  Recent Labs  03/06/16 2232  HGBA1C 6.0*   CBG:  Recent Labs Lab 03/07/16 1125 03/07/16 1622 03/07/16 2023 03/08/16 0741 03/08/16 1118  GLUCAP 204* 291* 324* 347* 354*   Lipid Profile: No results for input(s): CHOL, HDL, LDLCALC, TRIG, CHOLHDL, LDLDIRECT in the last 72 hours. Thyroid Function Tests: No results for input(s): TSH, T4TOTAL, FREET4, T3FREE, THYROIDAB in the last 72 hours. Anemia Panel: No results for input(s): VITAMINB12, FOLATE, FERRITIN, TIBC, IRON, RETICCTPCT in the last 72 hours. Sepsis Labs:  Recent Labs Lab 03/06/16 1845 03/06/16 2232 03/07/16 0322 03/07/16 1232  PROCALCITON  --   --   --  0.22  LATICACIDVEN 1.6 2.4* 1.7  --     Recent Results (from the past 240 hour(s))  Culture, blood (Routine X 2) w Reflex to ID Panel     Status: None (Preliminary result)   Collection Time: 03/06/16  6:45 PM  Result Value Ref Range Status   Specimen Description RIGHT ANTECUBITAL  Final   Special Requests BOTTLES DRAWN AEROBIC ONLY 6CC  Final   Culture NO GROWTH 2 DAYS  Final   Report Status PENDING  Incomplete  Culture, blood (Routine X 2) w Reflex to ID Panel     Status: None (Preliminary result)   Collection Time: 03/06/16  6:46 PM  Result Value Ref Range Status    Specimen Description LEFT ANTECUBITAL  Final   Special Requests BOTTLES DRAWN AEROBIC AND ANAEROBIC 6CC  Final   Culture NO GROWTH 2 DAYS  Final   Report Status PENDING  Incomplete  MRSA PCR Screening     Status: None   Collection Time: 03/06/16  9:41 PM  Result Value Ref Range Status   MRSA by PCR NEGATIVE  NEGATIVE Final    Comment:        The GeneXpert MRSA Assay (FDA approved for NASAL specimens only), is one component of a comprehensive MRSA colonization surveillance program. It is not intended to diagnose MRSA infection nor to guide or monitor treatment for MRSA infections.   Culture, blood (Routine X 2) w Reflex to ID Panel     Status: None (Preliminary result)   Collection Time: 03/06/16 10:32 PM  Result Value Ref Range Status   Specimen Description BLOOD LEFT HAND  Final   Special Requests IN PEDIATRIC BOTTLE 1CC  Final   Culture NO GROWTH 1 DAY  Final   Report Status PENDING  Incomplete  Culture, blood (Routine X 2) w Reflex to ID Panel     Status: None (Preliminary result)   Collection Time: 03/06/16 10:32 PM  Result Value Ref Range Status   Specimen Description BLOOD LEFT HAND  Final   Special Requests IN PEDIATRIC BOTTLE 1CC  Final   Culture NO GROWTH 1 DAY  Final   Report Status PENDING  Incomplete         Radiology Studies: Ct Head Wo Contrast  Result Date: 03/06/2016 CLINICAL DATA:  Lethargy worsening shortness of breath EXAM: CT HEAD WITHOUT CONTRAST TECHNIQUE: Contiguous axial images were obtained from the base of the skull through the vertex without intravenous contrast. COMPARISON:  05/31/2011 FINDINGS: Brain: Motion artifact limits the exam. No gross territorial infarction, hemorrhage or mass is visualized. 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 Old appearing infarct in the left cerebellum. Moderate atrophy. Mild periventricular white matter hypodensity consistent with small vessel disease. Vascular: No hyperdense  vessels.  Carotid artery calcifications. Skull: Mastoid air cells grossly clear.  No obvious skull fracture Sinuses/Orbits: Minimal mucosal thickening within the ethmoid sinuses. No acute orbital abnormality. Other: None IMPRESSION: No CT evidence for acute intracranial abnormality allowing for motion artifact. 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 Electronically Signed   By: Jasmine Pang M.D.   On: 03/06/2016 20:07        Scheduled Meds: . furosemide  160 mg Intravenous Q8H  . insulin aspart  0-9 Units Subcutaneous TID WC  . ipratropium-albuterol  3 mL Nebulization Q6H  . levothyroxine  25 mcg Oral Once per day on Mon Tue Wed Thu Fri  . levothyroxine  50 mcg Oral Once per day on Sun Sat  . metolazone  10 mg Oral 2 times per day  . metoprolol tartrate  12.5 mg Oral BID  . pentoxifylline  400 mg Oral BID  . piperacillin-tazobactam (ZOSYN)  IV  3.375 g Intravenous Q8H  . sertraline  100 mg Oral Daily  . vancomycin  1,500 mg Intravenous Q48H  . Warfarin - Pharmacist Dosing Inpatient   Does not apply Q24H   Continuous Infusions: . sodium chloride 10 mL/hr at 03/07/16 0400     LOS: 2 days    Time spent: 35 minutes.     Alba Cory, MD [facility] Pager [phone]  If 7PM-7AM, please contact night-coverage [email or web address] Password [email or web address] 03/08/2016, 3:28 PM

## 2016-03-09 ENCOUNTER — Inpatient Hospital Stay (HOSPITAL_COMMUNITY): Payer: Non-veteran care

## 2016-03-09 DIAGNOSIS — J9600 Acute respiratory failure, unspecified whether with hypoxia or hypercapnia: Secondary | ICD-10-CM

## 2016-03-09 LAB — HEPATIC FUNCTION PANEL
ALBUMIN: 2.2 g/dL — AB (ref 3.5–5.0)
ALT: 31 U/L (ref 17–63)
AST: 19 U/L (ref 15–41)
Alkaline Phosphatase: 60 U/L (ref 38–126)
Bilirubin, Direct: 0.5 mg/dL (ref 0.1–0.5)
Indirect Bilirubin: 1.1 mg/dL — ABNORMAL HIGH (ref 0.3–0.9)
TOTAL PROTEIN: 7.2 g/dL (ref 6.5–8.1)
Total Bilirubin: 1.6 mg/dL — ABNORMAL HIGH (ref 0.3–1.2)

## 2016-03-09 LAB — CBC
HEMATOCRIT: 26.3 % — AB (ref 39.0–52.0)
Hemoglobin: 8 g/dL — ABNORMAL LOW (ref 13.0–17.0)
MCH: 28.1 pg (ref 26.0–34.0)
MCHC: 30.4 g/dL (ref 30.0–36.0)
MCV: 92.3 fL (ref 78.0–100.0)
Platelets: 170 10*3/uL (ref 150–400)
RBC: 2.85 MIL/uL — ABNORMAL LOW (ref 4.22–5.81)
RDW: 18.6 % — AB (ref 11.5–15.5)
WBC: 7.9 10*3/uL (ref 4.0–10.5)

## 2016-03-09 LAB — RENAL FUNCTION PANEL
ALBUMIN: 2.2 g/dL — AB (ref 3.5–5.0)
ANION GAP: 17 — AB (ref 5–15)
Albumin: 2.3 g/dL — ABNORMAL LOW (ref 3.5–5.0)
Anion gap: 14 (ref 5–15)
BUN: 95 mg/dL — AB (ref 6–20)
BUN: 98 mg/dL — ABNORMAL HIGH (ref 6–20)
CHLORIDE: 97 mmol/L — AB (ref 101–111)
CHLORIDE: 95 mmol/L — AB (ref 101–111)
CO2: 24 mmol/L (ref 22–32)
CO2: 21 mmol/L — AB (ref 22–32)
Calcium: 8.9 mg/dL (ref 8.9–10.3)
Calcium: 9 mg/dL (ref 8.9–10.3)
Creatinine, Ser: 3.69 mg/dL — ABNORMAL HIGH (ref 0.61–1.24)
Creatinine, Ser: 3.96 mg/dL — ABNORMAL HIGH (ref 0.61–1.24)
GFR calc Af Amer: 18 mL/min — ABNORMAL LOW (ref >60)
GFR calc non Af Amer: 15 mL/min — ABNORMAL LOW (ref >60)
GFR calc non Af Amer: 14 mL/min — ABNORMAL LOW (ref >60)
GFR, EST AFRICAN AMERICAN: 16 mL/min — AB (ref >60)
GLUCOSE: 286 mg/dL — AB (ref 65–99)
Glucose, Bld: 264 mg/dL — ABNORMAL HIGH (ref 65–99)
PHOSPHORUS: 4.6 mg/dL (ref 2.5–4.6)
POTASSIUM: 3.2 mmol/L — AB (ref 3.5–5.1)
POTASSIUM: 3.6 mmol/L (ref 3.5–5.1)
Phosphorus: 4.7 mg/dL — ABNORMAL HIGH (ref 2.5–4.6)
Sodium: 135 mmol/L (ref 135–145)
Sodium: 133 mmol/L — ABNORMAL LOW (ref 135–145)

## 2016-03-09 LAB — RESPIRATORY PANEL BY PCR
ADENOVIRUS-RVPPCR: NOT DETECTED
Bordetella pertussis: NOT DETECTED
CORONAVIRUS NL63-RVPPCR: NOT DETECTED
CORONAVIRUS OC43-RVPPCR: NOT DETECTED
Chlamydophila pneumoniae: NOT DETECTED
Coronavirus 229E: NOT DETECTED
Coronavirus HKU1: NOT DETECTED
INFLUENZA A-RVPPCR: NOT DETECTED
INFLUENZA B-RVPPCR: NOT DETECTED
METAPNEUMOVIRUS-RVPPCR: NOT DETECTED
Mycoplasma pneumoniae: NOT DETECTED
PARAINFLUENZA VIRUS 1-RVPPCR: NOT DETECTED
PARAINFLUENZA VIRUS 2-RVPPCR: NOT DETECTED
PARAINFLUENZA VIRUS 4-RVPPCR: NOT DETECTED
Parainfluenza Virus 3: NOT DETECTED
RESPIRATORY SYNCYTIAL VIRUS-RVPPCR: NOT DETECTED
Rhinovirus / Enterovirus: NOT DETECTED

## 2016-03-09 LAB — ECHOCARDIOGRAM COMPLETE
CHL CUP DOP CALC LVOT VTI: 15 cm
CHL CUP RV SYS PRESS: 33 mmHg
CHL CUP TV REG PEAK VELOCITY: 276 cm/s
FS: 24 % — AB (ref 28–44)
Height: 71 in
IV/PV OW: 0.93
LA ID, A-P, ES: 47 mm
LA diam index: 2.05 cm/m2
LA vol A4C: 87 ml
LA vol index: 36.5 mL/m2
LA vol: 83.5 mL
LEFT ATRIUM END SYS DIAM: 47 mm
LV PW d: 14.3 mm — AB (ref 0.6–1.1)
LV TDI E'LATERAL: 9.62
LV TDI E'MEDIAL: 7.6
LV e' LATERAL: 9.62 cm/s
LVOT peak vel: 74.3 cm/s
RV LATERAL S' VELOCITY: 9.55 cm/s
TAPSE: 11.7 mm
TR max vel: 276 cm/s
Weight: 3897.73 oz

## 2016-03-09 LAB — PROTIME-INR
INR: 5.84 — AB
Prothrombin Time: 54.2 seconds — ABNORMAL HIGH (ref 11.4–15.2)

## 2016-03-09 LAB — INFLUENZA PANEL BY PCR (TYPE A & B)
Influenza A By PCR: POSITIVE — AB
Influenza B By PCR: NEGATIVE

## 2016-03-09 LAB — GLUCOSE, CAPILLARY
GLUCOSE-CAPILLARY: 296 mg/dL — AB (ref 65–99)
GLUCOSE-CAPILLARY: 245 mg/dL — AB (ref 65–99)
Glucose-Capillary: 264 mg/dL — ABNORMAL HIGH (ref 65–99)
Glucose-Capillary: 251 mg/dL — ABNORMAL HIGH (ref 65–99)

## 2016-03-09 LAB — PROCALCITONIN: PROCALCITONIN: 0.33 ng/mL

## 2016-03-09 LAB — SEDIMENTATION RATE: Sed Rate: 132 mm/hr — ABNORMAL HIGH (ref 0–16)

## 2016-03-09 MED ORDER — PHYTONADIONE 1 MG/0.5 ML ORAL SOLUTION
1.0000 mg | Freq: Once | ORAL | Status: AC
  Administered 2016-03-09: 1 mg via ORAL
  Filled 2016-03-09: qty 0.5

## 2016-03-09 MED ORDER — PERFLUTREN LIPID MICROSPHERE
1.0000 mL | INTRAVENOUS | Status: AC | PRN
  Filled 2016-03-09: qty 10

## 2016-03-09 MED ORDER — AZITHROMYCIN 500 MG IV SOLR
500.0000 mg | INTRAVENOUS | Status: DC
  Administered 2016-03-09: 500 mg via INTRAVENOUS
  Filled 2016-03-09: qty 500

## 2016-03-09 MED ORDER — LORAZEPAM 2 MG/ML IJ SOLN
1.0000 mg | Freq: Once | INTRAMUSCULAR | Status: AC
  Administered 2016-03-09: 1 mg via INTRAVENOUS
  Filled 2016-03-09: qty 1

## 2016-03-09 MED ORDER — OSELTAMIVIR PHOSPHATE 30 MG PO CAPS
30.0000 mg | ORAL_CAPSULE | ORAL | Status: DC
  Administered 2016-03-09: 30 mg via ORAL
  Filled 2016-03-09: qty 1

## 2016-03-09 MED ORDER — PERFLUTREN LIPID MICROSPHERE
INTRAVENOUS | Status: AC
  Administered 2016-03-09: 4 mL
  Filled 2016-03-09: qty 10

## 2016-03-09 NOTE — Progress Notes (Signed)
  Echocardiogram 2D Echocardiogram has been performed.  Delcie RochENNINGTON, Janesa Dockery 03/09/2016, 4:03 PM

## 2016-03-09 NOTE — Progress Notes (Signed)
PHARMACY NOTE:  ANTIMICROBIAL RENAL DOSAGE ADJUSTMENT  Current antimicrobial regimen includes a mismatch between antimicrobial dosage and estimated renal function.  As per policy approved by the Pharmacy & Therapeutics and Medical Executive Committees, the antimicrobial dosage will be adjusted accordingly.  Current antimicrobial dosage:  Tamiflu 75 mg BID  Indication: Influenza A positive   Renal Function:  Estimated Creatinine Clearance: 21.9 mL/min (by C-G formula based on SCr of 3.96 mg/dL (H)). []      On intermittent HD, scheduled: []      On CRRT    Antimicrobial dosage has been changed to:  Tamiflu 30 mg every 24 hours   Additional comments:   Thank you for allowing pharmacy to be a part of this patient's care.  Pollyann SamplesAndy Kwane Rohl, PharmD, BCPS 03/09/2016, 8:57 PM

## 2016-03-09 NOTE — Progress Notes (Signed)
[facility] Progress Note    Assessment/ Plan:   1.  SOB/ pulm edema/ vol overload: increasing urine output.  Continue to diurese with high-dose lasix, metolazone.  BID electrolytes. May not require HD again but making daily assessments.  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  Had a fistula placed which is maturing.  Remains stable so far  3.  LUL pneumonia: vanc/ Zosyn; ? if ARDS developing  4. Acute hypoxic respiratory failure: due to #s 1 and 3 as above.  Continue BiPaP.  CCM consulted.    5.  HTN: pressures Ok, on metop  Subjective:    Improving somewhat on BiPaP- repeat CXR with R-sided infiltrates, ? ARDS developing.  Diuresing./   Objective:   BP 132/72 (BP Location: Left Arm)   Pulse 79   Temp 98 F (36.7 C) (Axillary)   Resp (!) 22   Ht 5\' 11"  (1.803 m)   Wt 110.5 kg (243 lb 9.7 oz)   SpO2 98%   BMI 33.98 kg/m   Intake/Output Summary (Last 24 hours) at 03/09/16 1014 Last data filed at 03/09/16 0500  Gross per 24 hour  Intake              360 ml  Output             1950 ml  Net            -1590 ml   Weight change:   Physical Exam: GEN: lying in bed, on BiPaP, appears comfortable and less lethargic than yesterday HEENT: sclerae anicteric NECK + JVD improving PULM: diffuse rhonchi and wheezing improving CV tachy ABD obese, soft EXT 1+ LE edema, somewhat improved NEURO AAO x 3  Imaging: Dg Chest Portable 1 View  Result Date: 03/08/2016 CLINICAL DATA:  Respiratory failure. EXAM: PORTABLE CHEST 1 VIEW COMPARISON:  March 06, 2016 FINDINGS: Stable cardiomegaly. Increased opacity throughout the right lung, most prominent in the right upper lobe. The opacity on the right is asymmetric to the left where there is only minimal interstitial prominence. No other changes. IMPRESSION: 1. Diffuse opacity throughout the right lung most focal in the right upper lobe. I am suspicious for pneumonia in the right lung in the  setting of mild background edema. Electronically Signed   By: Gerome Sam III M.D   On: 03/08/2016 18:13    Labs: BMET  Recent Labs Lab 03/06/16 1235 03/06/16 2232 03/07/16 0322 03/08/16 0955 03/08/16 1626 03/09/16 0424  NA 143  --  142 135 136 135  K 3.6  --  4.0 3.9 4.0 3.2*  CL 103  --  104 99* 101 97*  CO2  --   --  25 18* 18* 24  GLUCOSE 88  --  209* 345* 334* 286*  BUN 91*  --  85* 98* 99* 95*  CREATININE 3.60* 3.53* 3.50* 3.65* 3.77* 3.69*  CALCIUM  --   --  9.1 9.0 9.1 8.9  PHOS  --   --   --  4.7* 4.5 4.6   CBC  Recent Labs Lab 03/06/16 1236 03/07/16 0322 03/08/16 0955 03/09/16 0424  WBC 7.6 5.6 8.4 7.9  NEUTROABS 5.6  --   --   --   HGB 8.6* 9.0* 8.3* 8.0*  HCT 29.5* 30.0* 27.6* 26.3*  MCV 97.7 95.5 94.2 92.3  PLT 181 156 164 170    Medications:    . furosemide  160 mg Intravenous Q8H  . insulin  aspart  0-9 Units Subcutaneous TID WC  . insulin glargine  20 Units Subcutaneous Daily  . ipratropium-albuterol  3 mL Nebulization Q6H  . levothyroxine  25 mcg Oral Once per day on Mon Tue Wed Thu Fri  . levothyroxine  50 mcg Oral Once per day on Sun Sat  . metolazone  10 mg Oral 2 times per day  . metoprolol tartrate  12.5 mg Oral BID  . pentoxifylline  400 mg Oral BID  . piperacillin-tazobactam (ZOSYN)  IV  3.375 g Intravenous Q8H  . sertraline  100 mg Oral Daily  . vancomycin  1,500 mg Intravenous Q48H  . Warfarin - Pharmacist Dosing Inpatient   Does not apply Q24H      Bufford ButtnerElizabeth Kenzlee Fishburn MD 03/09/2016, 10:14 AM

## 2016-03-09 NOTE — Progress Notes (Addendum)
ANTICOAGULATION CONSULT NOTE  Pharmacy Consult for warfarin Indication: atrial fibrillation   Waiting for CBC   Assessment: 70 yom on warfarin PTA for afib. Pharmacy consulted to dose inpatient. Last dose of warfarin 1/17.  INR still climbing - 4.53>4.91>5.55>5.84 despite holding dose. Patient is eating. Only potential DDI is with trental, but patient takes trental PTA. Spoke with MD; will check LFTs and give low dose vitamin K x1.   CT head neg for bleeding other [facility] states hematuria remains stable. No bleeding outside of this.   Goal of Therapy:  INR 2-3 Monitor platelets by anticoagulation protocol: Yes   Plan:  Hold warfarin tonight Vit K 1mg  PO x1 Daily INR and CBC (while INR rising) F/u LFTs Monitor s/sx bleeding  Alfredo BachJoseph Arminger, Cleotis NipperBS, PharmD Clinical Pharmacy Resident [phone] (Pager) 03/09/2016 10:23 AM

## 2016-03-09 NOTE — Progress Notes (Signed)
Plan move to ICU per Dr. Sunnie Nielsenegalado as rec. By pulmonary.  Dr. Sunnie Nielsenegalado attempting to notify wife and daughter. RRT RN updated on pt status and need to transfer to ICU d/t worsening CXR and bipap dependency. RRT RN arranging for transfer when bed available.

## 2016-03-09 NOTE — Progress Notes (Signed)
eLink Physician-Brief Progress Note Patient Name: Zachary CleverlyMichael D Bryars DOB: 1945-06-14 MRN: [id number]   Date of Service  03/09/2016  HPI/Events of Note  71 yr old male transferred to ICU with hypoxemic resp failure with bilat pulm infiltrates.  On bipap with 40% FIO2.  Work of breathing reasonable at this time.    eICU Interventions  Continue present treatment.  At risk for intubation.     Intervention Category Evaluation Type: New Patient Evaluation  Henry RusselSMITH, Shantina Chronister, P 03/09/2016, 6:59 PM

## 2016-03-09 NOTE — Progress Notes (Signed)
Pharmacy Antibiotic Note  [name] is a 71 y.o. male admitted on 03/06/2016 with pneumonia. Today is day 4 of vancomycin and zosyn. Patient is afebrile with normal WBC. Procalcitonin is elevated; however it is not a reliable marker in renal dysfunction. 1/20 CXR concerning for pneumonia. Creatinine hovers around 3.5-3.7, consistent with what appears to be new baseline from November hospitalization. UOP improved with lasix. Antibiotics dosed appropriately. Continue current regimen.   Plan: Vancomycin 1500 IV every 48 hours.  Goal trough 15-20 mcg/mL. Zosyn 3.375g IV q8h (4 hour infusion) - borderline for adjustment Monitor clinical progress, c/s, renal function, abx plan/LOT Vancomycin trough as indicated  Height: 5\' 11"  (180.3 cm) Weight: 243 lb 9.7 oz (110.5 kg) IBW/kg (Calculated) : 75.3  Temp (24hrs), Avg:97.8 F (36.6 C), Min:97.5 F (36.4 C), Max:98.5 F (36.9 C)   Recent Labs Lab 03/06/16 1236 03/06/16 1845 03/06/16 2232 03/07/16 0322 03/08/16 0955 03/08/16 1626 03/09/16 0424  WBC 7.6  --   --  5.6 8.4  --  7.9  CREATININE  --   --  3.53* 3.50* 3.65* 3.77* 3.69*  LATICACIDVEN  --  1.6 2.4* 1.7  --   --   --     Estimated Creatinine Clearance: 23.6 mL/min (by C-G formula based on SCr of 3.69 mg/dL (H)).    Allergies  Allergen Reactions  . No Known Allergies     Antimicrobials this admission: Vanc 1/18 >>  Zosyn 1/18 >>   Dose adjustments this admission:  Microbiology results: 1/18 BCx: ngtd 1/18 MRSA PCR: negThank you for allowing pharmacy to be a part of this patient's care.  Alfredo BachJoseph Nathaneil Feagans, Cleotis NipperBS, PharmD Clinical Pharmacy Resident [phone] (Pager) 03/09/2016 8:37 AM

## 2016-03-09 NOTE — Progress Notes (Signed)
CRITICAL VALUE ALERT  Critical value received:  INR 5.84  Date of notification:  03/09/2016  Time of notification:  0530  Critical value read back: Yes  Nurse who received alert:  Viviano SimasMorgan N Ryhanna Dunsmore, RN  Pharmacy dosing

## 2016-03-09 NOTE — Progress Notes (Addendum)
PROGRESS NOTE    [name]  AOZ:[id number] DOB: [birth year] DOA: 03/06/2016 PCP: Marlowe ShoresVAIDYA,RAKESHCHANDRA S, MD   Brief Narrative: [name]  is a 71 y.o. male, With a history of chronic diastolic CHF, see daily stage IV, paroxysmal atrial fibrillation on Coumadin, hypertension, diabetes mellitus who was brought to the hospital with worsening shortness of breath. Patient at this time is on BiPAP and unable to point any history. He was somnolent but arousable.   Assessment & Plan:   Principal Problem:   Acute on chronic diastolic CHF (congestive heart failure) (HCC) Active Problems:   IDDM (insulin dependent diabetes mellitus) (HCC)   Hypertensive heart disease   Hypothyroidism   Persistent atrial fibrillation (HCC)   CHF (congestive heart failure) (HCC)   Chronic anticoagulation   Acute on chronic respiratory failure (HCC)    1-Acute Hypoxic Respiratory failure;  He has been on BIPAP most of day yesterday and overnight.  Continue with IV lasix 160 TID. Metolazone BID>  Nephrology following.  Chest x ray 1-20 diffuse opacity right side.  CT chest ordered. patient is not able to tolerates CT.  Repeat chest x ray.  CCM consulted and following.  ECHO ordered.   Addendum;  Discussed care with Dr Marchelle Gearingamaswamy, plan to move patient to ICU, under CCM service. Patient continue to be on BIPAP and Chest x-ray worse. Autoimmune work up pending. Patient on IV antibiotics, will add azithromycin to cover for atypical.  Try to contact family today multiples times, unable to reach family over phone.   2-Acute encephalopathy;  This could be multifactorial, secondary to uremia, respiratory failure and or infection.  Treating possible cause.  Improved.   3-Persistent right upper lobe infiltrate/? Pneumonia- Will need repeat Chest x ray and or CT chest when stable.  continue with IV antibiotics at this time.  Lactic acid trending down.   4-Diabetes mellitus- SSI. Resume  lantus.    5-Hypothyroidism- continue Synthroid  6-Hypertension-  continue metoprolol.  7-Atrial fibrillation- heart rate is controlled, patient on Coumadin. Continue metoprolol.   8-Supratherapeutic INR- patient'[name] INR is 5, hold coumadin, supra therapeutic INR and hematuria.  Will give Vitamin k.   9-Hematuria,; holding  coumadin.    DVT prophylaxis: on coumadin.  Code Status: Full code.  Family Communication: None at bedside.  Disposition Plan: not stable to be discharge.    Consultants:   Nephrology  CCM   Procedures: none  Antimicrobials:   Vancomycin 1-18  Zosyn 1-18   Subjective: On BIPAP. Still with SOB.    Objective: Vitals:   03/09/16 0417 03/09/16 0734 03/09/16 0736 03/09/16 0800  BP: (!) 121/56   132/72  Pulse: 77  71 79  Resp: (!) 22  (!) 26 (!) 22  Temp: 97.6 F (36.4 C)   98 F (36.7 C)  TempSrc: Axillary   Axillary  SpO2: 99% 98% 97% 98%  Weight:      Height:        Intake/Output Summary (Last 24 hours) at 03/09/16 1040 Last data filed at 03/09/16 0500  Gross per 24 hour  Intake              360 ml  Output             1950 ml  Net            -1590 ml   Filed Weights   03/06/16 1904 03/06/16 2141 03/07/16 0600  Weight: 110.7 kg (244 lb) 110.6 kg (243 lb 13.3 oz) 110.5 kg (  243 lb 9.7 oz)    Examination:  General exam: alert  Respiratory system: Bilateral Air movement, bilateral crackles, ronchus./  Cardiovascular system: S1 & S2 heard, RRR. No JVD, murmurs, rubs, gallops or clicks. No pedal edema. Gastrointestinal system: Abdomen is nondistended, soft and nontender. No organomegaly or masses felt. Normal bowel sounds heard. Central nervous system: alert. Extremities: moves all extremities.  Skin: No rashes, lesions or ulcers     Data Reviewed: I have personally reviewed following labs and imaging studies  CBC:  Recent Labs Lab 03/06/16 1235 03/06/16 1236 03/07/16 0322 03/08/16 0955 03/09/16 0424  WBC  --  7.6 5.6 8.4 7.9   NEUTROABS  --  5.6  --   --   --   HGB 9.9* 8.6* 9.0* 8.3* 8.0*  HCT 29.0* 29.5* 30.0* 27.6* 26.3*  MCV  --  97.7 95.5 94.2 92.3  PLT  --  181 156 164 170   Basic Metabolic Panel:  Recent Labs Lab 03/06/16 1235 03/06/16 2232 03/07/16 0322 03/08/16 0955 03/08/16 1626 03/09/16 0424  NA 143  --  142 135 136 135  K 3.6  --  4.0 3.9 4.0 3.2*  CL 103  --  104 99* 101 97*  CO2  --   --  25 18* 18* 24  GLUCOSE 88  --  209* 345* 334* 286*  BUN 91*  --  85* 98* 99* 95*  CREATININE 3.60* 3.53* 3.50* 3.65* 3.77* 3.69*  CALCIUM  --   --  9.1 9.0 9.1 8.9  PHOS  --   --   --  4.7* 4.5 4.6   GFR: Estimated Creatinine Clearance: 23.6 mL/min (by C-G formula based on SCr of 3.69 mg/dL (H)). Liver Function Tests:  Recent Labs Lab 03/06/16 1236 03/07/16 0322 03/08/16 0955 03/08/16 1626 03/09/16 0424  AST 40 32  --   --   --   ALT 63 50  --   --   --   ALKPHOS 90 78  --   --   --   BILITOT 0.8 1.2  --   --   --   PROT 7.8 7.3  --   --   --   ALBUMIN 2.8* 2.5* 2.3* 2.3* 2.2*   No results for input([name]): LIPASE, AMYLASE in the last 168 hours. No results for input([name]): AMMONIA in the last 168 hours. Coagulation Profile:  Recent Labs Lab 03/06/16 1236 03/07/16 1232 03/08/16 0317 03/09/16 0424  INR 4.53* 4.91* 5.55* 5.84*   Cardiac Enzymes: No results for input([name]): CKTOTAL, CKMB, CKMBINDEX, TROPONINI in the last 168 hours. BNP (last 3 results) No results for input([name]): PROBNP in the last 8760 hours. HbA1C:  Recent Labs  03/06/16 2232  HGBA1C 6.0*   CBG:  Recent Labs Lab 03/08/16 0741 03/08/16 1118 03/08/16 1654 03/08/16 2121 03/09/16 0801  GLUCAP 347* 354* 321* 290* 296*   Lipid Profile: No results for input([name]): CHOL, HDL, LDLCALC, TRIG, CHOLHDL, LDLDIRECT in the last 72 hours. Thyroid Function Tests: No results for input([name]): TSH, T4TOTAL, FREET4, T3FREE, THYROIDAB in the last 72 hours. Anemia Panel: No results for input([name]): VITAMINB12, FOLATE, FERRITIN, TIBC,  IRON, RETICCTPCT in the last 72 hours. Sepsis Labs:  Recent Labs Lab 03/06/16 1845 03/06/16 2232 03/07/16 0322 03/07/16 1232 03/09/16 0424  PROCALCITON  --   --   --  0.22 0.33  LATICACIDVEN 1.6 2.4* 1.7  --   --     Recent Results (from the past 240 hour([name]))  Culture, blood (Routine X  2) w Reflex to ID Panel     Status: None (Preliminary result)   Collection Time: 03/06/16  6:45 PM  Result Value Ref Range Status   Specimen Description RIGHT ANTECUBITAL  Final   Special Requests BOTTLES DRAWN AEROBIC ONLY 6CC  Final   Culture NO GROWTH 2 DAYS  Final   Report Status PENDING  Incomplete  Culture, blood (Routine X 2) w Reflex to ID Panel     Status: None (Preliminary result)   Collection Time: 03/06/16  6:46 PM  Result Value Ref Range Status   Specimen Description LEFT ANTECUBITAL  Final   Special Requests BOTTLES DRAWN AEROBIC AND ANAEROBIC 6CC  Final   Culture NO GROWTH 2 DAYS  Final   Report Status PENDING  Incomplete  MRSA PCR Screening     Status: None   Collection Time: 03/06/16  9:41 PM  Result Value Ref Range Status   MRSA by PCR NEGATIVE NEGATIVE Final    Comment:        The GeneXpert MRSA Assay (FDA approved for NASAL specimens only), is one component of a comprehensive MRSA colonization surveillance program. It is not intended to diagnose MRSA infection nor to guide or monitor treatment for MRSA infections.   Culture, blood (Routine X 2) w Reflex to ID Panel     Status: None (Preliminary result)   Collection Time: 03/06/16 10:32 PM  Result Value Ref Range Status   Specimen Description BLOOD LEFT HAND  Final   Special Requests IN PEDIATRIC BOTTLE 1CC  Final   Culture NO GROWTH 1 DAY  Final   Report Status PENDING  Incomplete  Culture, blood (Routine X 2) w Reflex to ID Panel     Status: None (Preliminary result)   Collection Time: 03/06/16 10:32 PM  Result Value Ref Range Status   Specimen Description BLOOD LEFT HAND  Final   Special Requests IN PEDIATRIC  BOTTLE 1CC  Final   Culture NO GROWTH 1 DAY  Final   Report Status PENDING  Incomplete         Radiology Studies: Dg Chest Portable 1 View  Result Date: 03/08/2016 CLINICAL DATA:  Respiratory failure. EXAM: PORTABLE CHEST 1 VIEW COMPARISON:  March 06, 2016 FINDINGS: Stable cardiomegaly. Increased opacity throughout the right lung, most prominent in the right upper lobe. The opacity on the right is asymmetric to the left where there is only minimal interstitial prominence. No other changes. IMPRESSION: 1. Diffuse opacity throughout the right lung most focal in the right upper lobe. I am suspicious for pneumonia in the right lung in the setting of mild background edema. Electronically Signed   By: Gerome Sam III M.D   On: 03/08/2016 18:13        Scheduled Meds: . furosemide  160 mg Intravenous Q8H  . insulin aspart  0-9 Units Subcutaneous TID WC  . insulin glargine  20 Units Subcutaneous Daily  . ipratropium-albuterol  3 mL Nebulization Q6H  . levothyroxine  25 mcg Oral Once per day on Mon Tue Wed Thu Fri  . levothyroxine  50 mcg Oral Once per day on Sun Sat  . metolazone  10 mg Oral 2 times per day  . metoprolol tartrate  12.5 mg Oral BID  . pentoxifylline  400 mg Oral BID  . phytonadione  1 mg Oral Once  . piperacillin-tazobactam (ZOSYN)  IV  3.375 g Intravenous Q8H  . sertraline  100 mg Oral Daily  . vancomycin  1,500 mg Intravenous Q48H  .  Warfarin - Pharmacist Dosing Inpatient   Does not apply Q24H   Continuous Infusions: . sodium chloride 10 mL/hr at 03/07/16 0400     LOS: 3 days    Time spent: 35 minutes.     Alba Cory, MD [facility] Pager [phone]  If 7PM-7AM, please contact night-coverage [email or web address] Password [email or web address] 03/09/2016, 10:40 AM

## 2016-03-09 NOTE — Progress Notes (Signed)
PULMONARY / CRITICAL CARE MEDICINE   Name: [name] MRN: [id number] DOB: 08/14/1945    ADMISSION DATE:  03/06/2016 CONSULTATION DATE:  1/19  REFERRING MD:  [facility]  CHIEF COMPLAINT:  AMS  HISTORY OF PRESENT ILLNESS:   71 yo obese(243lbs) former smoker(quit 8 years ago)with known LBB(PPM) PAF(on BB and coumadin) and CRF(base cret 3.5) who was admitted 1/18 with CC of SOB. He was admitted 12/17 and required HD vvia temp cath that was removed recently. His CxR shows diffuse edema and persistent rul infiltrate cw pna. PCCM was called 1/19 for AMS and resp failure requiring NIMVS. He was awake and alert, aggressive diuresis had been instituted and renal called. He does not need intubation at this time (may in future) and may need another HD cath as his rt av graft is not mature. We will leave him in SDU but if worsens we will move to ICU.   SUBJECTIVE:  Awake.  Dyspneic on bipap.  Just got back to bed from chair where he sat for several hours on HFNC.  Got very dyspneic during transfer back to bed.  Otherwise feeling a bit better.   VITAL SIGNS: BP (!) 119/54 (BP Location: Left Arm)   Pulse 63   Temp 97.9 F (36.6 C) (Oral)   Resp (!) 24   Ht 5\' 11"  (1.803 m)   Wt 110.5 kg (243 lb 9.7 oz)   SpO2 99%   BMI 33.98 kg/m   HEMODYNAMICS:    VENTILATOR SETTINGS: FiO2 (%):  [40 %] 40 %  INTAKE / OUTPUT: I/O last 3 completed shifts: In: 1146 [P.O.:840; I.V.:90; IV Piggyback:216] Out: 3200 [Urine:3200]  PHYSICAL EXAMINATION: General:  Ill appearing male, SOB after walking to chair  Neuro:  Awake, alert, appropriate, some anxiety/agitation overnight per daughter HEENT: No JVD/LAN Cardiovascular:  s1s2 rrr Lungs: resps even, mildly labored on bipap (just placed back on bipap 2-10 mins ago), coarse, bibasilar crackles, exp wheeze Abdomen: soft, +bs  Musculoskeletal: warm and dry, scant BLE edema   LABS:  BMET  Recent Labs Lab 03/08/16 0955 03/08/16 1626 03/09/16 0424   NA 135 136 135  K 3.9 4.0 3.2*  CL 99* 101 97*  CO2 18* 18* 24  BUN 98* 99* 95*  CREATININE 3.65* 3.77* 3.69*  GLUCOSE 345* 334* 286*    Electrolytes  Recent Labs Lab 03/08/16 0955 03/08/16 1626 03/09/16 0424  CALCIUM 9.0 9.1 8.9  PHOS 4.7* 4.5 4.6    CBC  Recent Labs Lab 03/07/16 0322 03/08/16 0955 03/09/16 0424  WBC 5.6 8.4 7.9  HGB 9.0* 8.3* 8.0*  HCT 30.0* 27.6* 26.3*  PLT 156 164 170    Coag's  Recent Labs Lab 03/07/16 1232 03/08/16 0317 03/09/16 0424  INR 4.91* 5.55* 5.84*    Sepsis Markers  Recent Labs Lab 03/06/16 1845 03/06/16 2232 03/07/16 0322 03/07/16 1232 03/09/16 0424  LATICACIDVEN 1.6 2.4* 1.7  --   --   PROCALCITON  --   --   --  0.22 0.33    ABG  Recent Labs Lab 03/06/16 1319 03/06/16 1820 03/07/16 0250  PHART 7.357 7.404 7.393  PCO2ART 47.6 40 42.0  PO2ART 31.9* 155* 123*    Liver Enzymes  Recent Labs Lab 03/06/16 1236 03/07/16 0322  03/08/16 1626 03/09/16 0424 03/09/16 1118  AST 40 32  --   --   --  19  ALT 63 50  --   --   --  31  ALKPHOS 90 78  --   --   --  60  BILITOT 0.8 1.2  --   --   --  1.6*  ALBUMIN 2.8* 2.5*  < > 2.3* 2.2* 2.2*  < > = values in this interval not displayed.  Cardiac Enzymes No results for input(s): TROPONINI, PROBNP in the last 168 hours.  Glucose  Recent Labs Lab 03/08/16 0741 03/08/16 1118 03/08/16 1654 03/08/16 2121 03/09/16 0801 03/09/16 1213  GLUCAP 347* 354* 321* 290* 296* 264*    Imaging Dg Chest Port 1 View  Result Date: 03/09/2016 CLINICAL DATA:  Hypoxemia.  Pacemaker insertion. EXAM: PORTABLE CHEST 1 VIEW COMPARISON:  March 08, 2014 FINDINGS: Bilateral pulmonary opacities more focal in the right upper and mid lung, worsened in the interval. No other interval change. IMPRESSION: Worsening bilateral pulmonary opacities, right greater than left, with a consolidated appearance in the right upper lung. The findings are most worrisome for multifocal pneumonia.  Edema is considered less likely. Electronically Signed   By: Gerome Samavid  Williams III M.D   On: 03/09/2016 14:45   Dg Chest Portable 1 View  Result Date: 03/08/2016 CLINICAL DATA:  Respiratory failure. EXAM: PORTABLE CHEST 1 VIEW COMPARISON:  March 06, 2016 FINDINGS: Stable cardiomegaly. Increased opacity throughout the right lung, most prominent in the right upper lobe. The opacity on the right is asymmetric to the left where there is only minimal interstitial prominence. No other changes. IMPRESSION: 1. Diffuse opacity throughout the right lung most focal in the right upper lobe. I am suspicious for pneumonia in the right lung in the setting of mild background edema. Electronically Signed   By: Gerome Samavid  Williams III M.D   On: 03/08/2016 18:13     STUDIES:  2D echo 1/21>>>  CULTURES: 1/18 bc>>  ANTIBIOTICS: 1/18 vanc>> 1/18 Zoysn>>   SIGNIFICANT EVENTS:   LINES/TUBES: 12/11 rt av graft>>  DISCUSSION: 71 yo obese(243lbs) former smoker(quit 8 years ago)with known LBB(PPM) PAF(on BB and coumadin) and CRF(base cret 3.5) who was admitted 1/18 with CC of SOB. He was admitted 12/17 and required HD vvia temp cath that was removed recently. His CxR shows diffuse edema and persistent rul infiltrate cw pna. PCCM was called 1/19 for AMS and resp failure requiring NIMVS. He was awake and alert, aggressive diuresis had been instituted and renal called. He does not need intubation at this time(may in future) and may need another HD cath as his rt av graft is not mature. We will leave him in SDU but if worsens we will move to ICU.  ASSESSMENT / PLAN:   Acute respiratory failure with hypoxia - multifactorial in setting acute on chronic dCHF, ?PNA.  Improving slowly with diuresis, abx.   Suspect OSA  COPD   P:   Continue bipap as needed - did tolerate several hours off this afternoon Aggressive diuresis per renal - daily assessment of HD needs  Would need new HD cath if dialysis required - graft not  mature  BD's  Broad spectrum abx as above  Pulmonary hygiene  Mobilize as able  F/u CXR in am 1/22 Echo pending  CT chest pending  Send RVP, flu PCR  -- pt had e-visit 1/18 just prior to admit for flu-like symptoms (fever, cough, SOB) and was prescribed tamiflu but did not take it prior to being admitted.  Symptoms could certainly have been r/t volume overload but with progression on CXR may need to consider flu related ALI/ARDS.    Chronic renal failure with recent HD cath and HD 12/17 HD recently removed Immature AV graft  P:   Diuresis as above per renal  F/u chem  Avoiding HD as able - daily assessment    FAMILY  - Updates: daughter updated at bedside 1/21  - Inter-disciplinary family meet or Palliative Care meeting due by:  day 7     Dirk Dress, NP 03/09/2016  2:53 PM Pager: [phone] or [phone]

## 2016-03-09 NOTE — Progress Notes (Signed)
eLink Physician-Brief Progress Note Patient Name: Zachary CleverlyMichael D [name] DOB: 1946-01-11 MRN: [id number]   Date of Service  03/09/2016  HPI/Events of Note  Influenza A positive  eICU Interventions  tamiflu     Intervention Category Intermediate Interventions: Infection - evaluation and management  Henry RusselSMITH, Geryl Dohn, P 03/09/2016, 8:35 PM

## 2016-03-09 NOTE — Progress Notes (Signed)
Report called to Olean ReeShami, RN for Transfer to [phone].

## 2016-03-10 ENCOUNTER — Inpatient Hospital Stay (HOSPITAL_COMMUNITY): Payer: Non-veteran care

## 2016-03-10 DIAGNOSIS — I5023 Acute on chronic systolic (congestive) heart failure: Secondary | ICD-10-CM

## 2016-03-10 DIAGNOSIS — I5033 Acute on chronic diastolic (congestive) heart failure: Secondary | ICD-10-CM

## 2016-03-10 DIAGNOSIS — J441 Chronic obstructive pulmonary disease with (acute) exacerbation: Secondary | ICD-10-CM

## 2016-03-10 LAB — BLOOD GAS, ARTERIAL
Acid-base deficit: 2 mmol/L (ref 0.0–2.0)
Bicarbonate: 22.9 mmol/L (ref 20.0–28.0)
DRAWN BY: 398991
FIO2: 100
MECHVT: 590 mL
O2 Saturation: 100 %
PEEP: 5 cmH2O
PH ART: 7.343 — AB (ref 7.350–7.450)
Patient temperature: 98.6
RATE: 20 resp/min
pCO2 arterial: 43.3 mmHg (ref 32.0–48.0)
pO2, Arterial: 422 mmHg — ABNORMAL HIGH (ref 83.0–108.0)

## 2016-03-10 LAB — RENAL FUNCTION PANEL
ALBUMIN: 2.2 g/dL — AB (ref 3.5–5.0)
ANION GAP: 20 — AB (ref 5–15)
ANION GAP: 18 — AB (ref 5–15)
Albumin: 2.2 g/dL — ABNORMAL LOW (ref 3.5–5.0)
BUN: 104 mg/dL — ABNORMAL HIGH (ref 6–20)
BUN: 108 mg/dL — ABNORMAL HIGH (ref 6–20)
CHLORIDE: 95 mmol/L — AB (ref 101–111)
CO2: 20 mmol/L — ABNORMAL LOW (ref 22–32)
CO2: 21 mmol/L — ABNORMAL LOW (ref 22–32)
Calcium: 9.1 mg/dL (ref 8.9–10.3)
Calcium: 8.9 mg/dL (ref 8.9–10.3)
Chloride: 96 mmol/L — ABNORMAL LOW (ref 101–111)
Creatinine, Ser: 4.19 mg/dL — ABNORMAL HIGH (ref 0.61–1.24)
Creatinine, Ser: 4.43 mg/dL — ABNORMAL HIGH (ref 0.61–1.24)
GFR calc Af Amer: 14 mL/min — ABNORMAL LOW (ref >60)
GFR calc non Af Amer: 13 mL/min — ABNORMAL LOW (ref >60)
GFR, EST AFRICAN AMERICAN: 15 mL/min — AB (ref >60)
GFR, EST NON AFRICAN AMERICAN: 12 mL/min — AB (ref >60)
GLUCOSE: 277 mg/dL — AB (ref 65–99)
Glucose, Bld: 346 mg/dL — ABNORMAL HIGH (ref 65–99)
PHOSPHORUS: 5.9 mg/dL — AB (ref 2.5–4.6)
Phosphorus: 5.3 mg/dL — ABNORMAL HIGH (ref 2.5–4.6)
Potassium: 3 mmol/L — ABNORMAL LOW (ref 3.5–5.1)
Potassium: 3.1 mmol/L — ABNORMAL LOW (ref 3.5–5.1)
Sodium: 135 mmol/L (ref 135–145)
Sodium: 135 mmol/L (ref 135–145)

## 2016-03-10 LAB — CBC
HCT: 27.3 % — ABNORMAL LOW (ref 39.0–52.0)
HEMOGLOBIN: 8.3 g/dL — AB (ref 13.0–17.0)
MCH: 28.2 pg (ref 26.0–34.0)
MCHC: 30.4 g/dL (ref 30.0–36.0)
MCV: 92.9 fL (ref 78.0–100.0)
PLATELETS: 176 10*3/uL (ref 150–400)
RBC: 2.94 MIL/uL — ABNORMAL LOW (ref 4.22–5.81)
RDW: 18.4 % — ABNORMAL HIGH (ref 11.5–15.5)
WBC: 8.8 10*3/uL (ref 4.0–10.5)

## 2016-03-10 LAB — PROTIME-INR
INR: 2.59
Prothrombin Time: 28.3 seconds — ABNORMAL HIGH (ref 11.4–15.2)

## 2016-03-10 LAB — GLUCOSE, CAPILLARY
GLUCOSE-CAPILLARY: 298 mg/dL — AB (ref 65–99)
Glucose-Capillary: 301 mg/dL — ABNORMAL HIGH (ref 65–99)
Glucose-Capillary: 369 mg/dL — ABNORMAL HIGH (ref 65–99)
Glucose-Capillary: 467 mg/dL — ABNORMAL HIGH (ref 65–99)

## 2016-03-10 LAB — MAGNESIUM
Magnesium: 2.1 mg/dL (ref 1.7–2.4)
Magnesium: 2.2 mg/dL (ref 1.7–2.4)

## 2016-03-10 LAB — PHOSPHORUS
Phosphorus: 5.5 mg/dL — ABNORMAL HIGH (ref 2.5–4.6)
Phosphorus: 5.7 mg/dL — ABNORMAL HIGH (ref 2.5–4.6)

## 2016-03-10 LAB — VANCOMYCIN, TROUGH: Vancomycin Tr: 27 ug/mL (ref 15–20)

## 2016-03-10 MED ORDER — MIDAZOLAM HCL 2 MG/2ML IJ SOLN
INTRAMUSCULAR | Status: AC
  Administered 2016-03-10: 2 mg via INTRAVENOUS
  Filled 2016-03-10: qty 2

## 2016-03-10 MED ORDER — ETOMIDATE 2 MG/ML IV SOLN
40.0000 mg | Freq: Once | INTRAVENOUS | Status: AC
  Administered 2016-03-10: 40 mg via INTRAVENOUS

## 2016-03-10 MED ORDER — FENTANYL CITRATE (PF) 100 MCG/2ML IJ SOLN
INTRAMUSCULAR | Status: AC
  Administered 2016-03-10: 100 ug via INTRAVENOUS
  Filled 2016-03-10: qty 2

## 2016-03-10 MED ORDER — METOPROLOL TARTRATE 25 MG/10 ML ORAL SUSPENSION
12.5000 mg | Freq: Two times a day (BID) | ORAL | Status: DC
  Administered 2016-03-10 (×2): 12.5 mg
  Filled 2016-03-10 (×5): qty 10

## 2016-03-10 MED ORDER — ETOMIDATE 2 MG/ML IV SOLN
INTRAVENOUS | Status: AC
  Filled 2016-03-10: qty 10

## 2016-03-10 MED ORDER — FENTANYL CITRATE (PF) 100 MCG/2ML IJ SOLN
50.0000 ug | Freq: Once | INTRAMUSCULAR | Status: AC
  Administered 2016-03-10: 50 ug via INTRAVENOUS

## 2016-03-10 MED ORDER — CHLORHEXIDINE GLUCONATE 0.12% ORAL RINSE (MEDLINE KIT)
15.0000 mL | Freq: Two times a day (BID) | OROMUCOSAL | Status: DC
  Administered 2016-03-10 – 2016-03-14 (×8): 15 mL via OROMUCOSAL

## 2016-03-10 MED ORDER — ROCURONIUM BROMIDE 50 MG/5ML IV SOLN
40.0000 mg | Freq: Once | INTRAVENOUS | Status: AC
  Administered 2016-03-10: 40 mg via INTRAVENOUS
  Filled 2016-03-10: qty 4

## 2016-03-10 MED ORDER — WARFARIN 0.5 MG HALF TABLET: 0.5000 mg | ORAL_TABLET | Freq: Once | ORAL | Status: DC

## 2016-03-10 MED ORDER — MIDAZOLAM HCL 2 MG/2ML IJ SOLN
INTRAMUSCULAR | Status: AC
  Filled 2016-03-10: qty 2

## 2016-03-10 MED ORDER — INSULIN ASPART 100 UNIT/ML ~~LOC~~ SOLN
0.0000 [IU] | SUBCUTANEOUS | Status: DC
  Administered 2016-03-10: 7 [IU] via SUBCUTANEOUS
  Administered 2016-03-10 (×2): 9 [IU] via SUBCUTANEOUS

## 2016-03-10 MED ORDER — PIPERACILLIN-TAZOBACTAM IN DEX 2-0.25 GM/50ML IV SOLN
2.2500 g | Freq: Three times a day (TID) | INTRAVENOUS | Status: DC
  Administered 2016-03-10 – 2016-03-11 (×2): 2.25 g via INTRAVENOUS
  Filled 2016-03-10 (×3): qty 50

## 2016-03-10 MED ORDER — OSELTAMIVIR PHOSPHATE 6 MG/ML PO SUSR
30.0000 mg | Freq: Every day | ORAL | Status: AC
Start: 2016-03-10 — End: 2016-03-13
  Administered 2016-03-10 – 2016-03-13 (×4): 30 mg via ORAL
  Filled 2016-03-10 (×6): qty 5

## 2016-03-10 MED ORDER — ETOMIDATE 2 MG/ML IV SOLN
20.0000 mg | Freq: Once | INTRAVENOUS | Status: AC
  Administered 2016-03-10: 20 mg via INTRAVENOUS

## 2016-03-10 MED ORDER — HEPARIN SODIUM (PORCINE) 1000 UNIT/ML DIALYSIS
1000.0000 [IU] | INTRAMUSCULAR | Status: DC | PRN
  Administered 2016-03-10: 2800 [IU] via INTRAVENOUS_CENTRAL
  Filled 2016-03-10 (×2): qty 6
  Filled 2016-03-10: qty 3

## 2016-03-10 MED ORDER — METHYLPREDNISOLONE SODIUM SUCC 40 MG IJ SOLR
40.0000 mg | Freq: Two times a day (BID) | INTRAMUSCULAR | Status: DC
  Administered 2016-03-10 – 2016-03-12 (×6): 40 mg via INTRAVENOUS
  Filled 2016-03-10 (×7): qty 1

## 2016-03-10 MED ORDER — CHLORHEXIDINE GLUCONATE 0.12% ORAL RINSE (MEDLINE KIT)
15.0000 mL | Freq: Two times a day (BID) | OROMUCOSAL | Status: DC
  Administered 2016-03-10: 15 mL via OROMUCOSAL

## 2016-03-10 MED ORDER — ORAL CARE MOUTH RINSE
15.0000 mL | OROMUCOSAL | Status: DC
  Administered 2016-03-10 – 2016-03-14 (×38): 15 mL via OROMUCOSAL

## 2016-03-10 MED ORDER — FENTANYL BOLUS VIA INFUSION
25.0000 ug | INTRAVENOUS | Status: DC | PRN
  Administered 2016-03-11 – 2016-03-13 (×4): 25 ug via INTRAVENOUS
  Filled 2016-03-10: qty 25

## 2016-03-10 MED ORDER — MIDAZOLAM HCL 2 MG/2ML IJ SOLN
2.0000 mg | Freq: Once | INTRAMUSCULAR | Status: AC
  Administered 2016-03-10: 2 mg via INTRAVENOUS

## 2016-03-10 MED ORDER — PRO-STAT SUGAR FREE PO LIQD
30.0000 mL | Freq: Every day | ORAL | Status: DC
  Administered 2016-03-10 – 2016-03-14 (×20): 30 mL
  Filled 2016-03-10 (×20): qty 30

## 2016-03-10 MED ORDER — SODIUM CHLORIDE 0.9 % IV SOLN
Freq: Once | INTRAVENOUS | Status: AC
  Administered 2016-03-10: 16:00:00 via INTRAVENOUS

## 2016-03-10 MED ORDER — ORAL CARE MOUTH RINSE
15.0000 mL | OROMUCOSAL | Status: DC
  Administered 2016-03-10 (×3): 15 mL via OROMUCOSAL

## 2016-03-10 MED ORDER — POTASSIUM CHLORIDE 20 MEQ/15ML (10%) PO SOLN
30.0000 meq | Freq: Two times a day (BID) | ORAL | Status: AC
  Administered 2016-03-10 (×2): 30 meq
  Filled 2016-03-10 (×2): qty 30

## 2016-03-10 MED ORDER — SODIUM CHLORIDE 0.9 % IV SOLN
25.0000 ug/h | INTRAVENOUS | Status: DC
  Administered 2016-03-10: 275 ug/h via INTRAVENOUS
  Administered 2016-03-10: 100 ug/h via INTRAVENOUS
  Administered 2016-03-11: 400 ug/h via INTRAVENOUS
  Administered 2016-03-11 (×2): 300 ug/h via INTRAVENOUS
  Administered 2016-03-12: 200 ug/h via INTRAVENOUS
  Administered 2016-03-12: 250 ug/h via INTRAVENOUS
  Administered 2016-03-13 – 2016-03-14 (×2): 200 ug/h via INTRAVENOUS
  Filled 2016-03-10 (×10): qty 50

## 2016-03-10 MED ORDER — PRO-STAT SUGAR FREE PO LIQD
30.0000 mL | Freq: Two times a day (BID) | ORAL | Status: DC
  Administered 2016-03-10: 30 mL
  Filled 2016-03-10: qty 30

## 2016-03-10 MED ORDER — FENTANYL CITRATE (PF) 100 MCG/2ML IJ SOLN
200.0000 ug | Freq: Once | INTRAMUSCULAR | Status: AC
Start: 2016-03-10 — End: 2016-03-10
  Administered 2016-03-10 (×2): 100 ug via INTRAVENOUS

## 2016-03-10 MED ORDER — VITAL HIGH PROTEIN PO LIQD
1000.0000 mL | ORAL | Status: DC
  Administered 2016-03-10 – 2016-03-12 (×3): 1000 mL
  Administered 2016-03-13 (×2)
  Administered 2016-03-13 – 2016-03-14 (×2): 1000 mL
  Filled 2016-03-10 (×6): qty 1000

## 2016-03-10 MED ORDER — ROCURONIUM BROMIDE 50 MG/5ML IV SOLN: 40.0000 mg/kg | Freq: Once | INTRAVENOUS | Status: DC

## 2016-03-10 NOTE — Progress Notes (Signed)
   HD cath ok to use  Dr. Kalman ShanMurali [name], M.D., [facility] HospitalF.C.C.P Pulmonary and Critical Care Medicine Staff Physician [facility] Pager: [phone], If no answer or between  15:00h - 7:00h: call [phone]  03/10/2016 6:10 PM

## 2016-03-10 NOTE — Progress Notes (Signed)
Initial Nutrition Assessment  DOCUMENTATION CODES:   Obesity unspecified  INTERVENTION:   Continue Vital High Protein @ 40 ml/hr (960 ml/day) Increase Prostat to 30 ml five times per day Provides: 1460 kcal, 159 grams protein, and 802 ml H2O.    NUTRITION DIAGNOSIS:   Inadequate oral intake related to inability to eat as evidenced by NPO status.  GOAL:   Provide needs based on ASPEN/SCCM guidelines  MONITOR:   TF tolerance, Skin, Labs  REASON FOR ASSESSMENT:   Consult Enteral/tube feeding initiation and management  ASSESSMENT:   71 yo obese(243lbs) former smoker(quit 8 years ago)with known LBB(PPM) PAF(on BB and coumadin) and CRF(base cret 3.5) who was admitted 1/18 with CC of SOB. His CxR shows diffuse edema and persistent rul infiltrate cw pna, + flu.   1/22 pt intubated this am and started on adult TF protocol.  Unable to complete Nutrition-Focused physical exam at this time.   Labs reviewed: PO4: 5.5, K+ 3.0 CBG's: 245-298   Diet Order:  Diet NPO time specified Except for: Sips with Meds  Skin:  Wound (see comment) (R knee wound, L buttocks skin tear, excor groin)  Last BM:  1/22  Height:   Ht Readings from Last 1 Encounters:  03/06/16 5\' 11"  (1.803 m)    Weight:   Wt Readings from Last 1 Encounters:  03/10/16 245 lb 2.4 oz (111.2 kg)    Ideal Body Weight:  78.1 kg  BMI:  Body mass index is 34.19 kg/m.  Estimated Nutritional Needs:   Kcal:  [id number]  Protein:  >/= 156 grams  Fluid:  > 1.5 L/day  EDUCATION NEEDS:   No education needs identified at this time  Kendell BaneHeather Cassie Shedlock RD, LDN, CNSC [phone] Pager [phone] After Hours Pager

## 2016-03-10 NOTE — Progress Notes (Signed)
PULMONARY / CRITICAL CARE MEDICINE   Name: [name] MRN: [id number] DOB: Jul 02, 1945    ADMISSION DATE:  03/06/2016 CONSULTATION DATE:  1/19  REFERRING MD:  [facility]  CHIEF COMPLAINT:  AMS  HISTORY OF PRESENT ILLNESS:   71 yo obese(243lbs) former smoker(quit 8 years ago)with known LBB(PPM) PAF(on BB and coumadin) and CRF(base cret 3.5) who was admitted 1/18 with CC of SOB. He was admitted 12/17 and required HD vvia temp cath that was removed recently. His CxR shows diffuse edema and persistent rul infiltrate cw pna. PCCM was called 1/19 for AMS and resp failure requiring NIMVS. He was awake and alert, aggressive diuresis had been instituted and renal called. He does not need intubation at this time (may in future) and may need another HD cath as his rt av graft is not mature. We will leave him in SDU but if worsens we will move to ICU.   SUBJECTIVE:  On BIPAP, flu positive, on tamiflu No pressors Good volumes on BIPAP but removed mask and increase RR  VITAL SIGNS: BP 127/65   Pulse 78   Temp 97.7 F (36.5 C) (Axillary)   Resp 20   Ht 5\' 11"  (1.803 m)   Wt 111.2 kg (245 lb 2.4 oz)   SpO2 96%   BMI 34.19 kg/m   HEMODYNAMICS:    VENTILATOR SETTINGS: FiO2 (%):  [40 %] 40 %  INTAKE / OUTPUT: I/O last 3 completed shifts: In: 630.5 [P.O.:120; IV Piggyback:510.5] Out: 1875 [Urine:1875]  PHYSICAL EXAMINATION: General:  Ill appearing male on BIPAP moderate to severe distress  Neuro:  Awake, alert, appropriate, some int anxiety HEENT:jvd wnl Cardiovascular:  s1s2 rrr Lungs: coarse BS Abdomen: soft, +bs  Musculoskeletal: warm and dry, scant BLE edema   LABS:  BMET  Recent Labs Lab 03/09/16 0424 03/09/16 1755 03/10/16 0512  NA 135 133* 135  K 3.2* 3.6 3.0*  CL 97* 95* 95*  CO2 24 21* 20*  BUN 95* 98* 104*  CREATININE 3.69* 3.96* 4.19*  GLUCOSE 286* 264* 277*    Electrolytes  Recent Labs Lab 03/09/16 0424 03/09/16 1755 03/10/16 0512  CALCIUM 8.9  9.0 9.1  PHOS 4.6 4.7* 5.3*    CBC  Recent Labs Lab 03/08/16 0955 03/09/16 0424 03/10/16 0512  WBC 8.4 7.9 8.8  HGB 8.3* 8.0* 8.3*  HCT 27.6* 26.3* 27.3*  PLT 164 170 176    Coag's  Recent Labs Lab 03/08/16 0317 03/09/16 0424 03/10/16 0512  INR 5.55* 5.84* 2.59    Sepsis Markers  Recent Labs Lab 03/06/16 1845 03/06/16 2232 03/07/16 0322 03/07/16 1232 03/09/16 0424  LATICACIDVEN 1.6 2.4* 1.7  --   --   PROCALCITON  --   --   --  0.22 0.33    ABG  Recent Labs Lab 03/06/16 1319 03/06/16 1820 03/07/16 0250  PHART 7.357 7.404 7.393  PCO2ART 47.6 40 42.0  PO2ART 31.9* 155* 123*    Liver Enzymes  Recent Labs Lab 03/06/16 1236 03/07/16 0322  03/09/16 1118 03/09/16 1755 03/10/16 0512  AST 40 32  --  19  --   --   ALT 63 50  --  31  --   --   ALKPHOS 90 78  --  60  --   --   BILITOT 0.8 1.2  --  1.6*  --   --   ALBUMIN 2.8* 2.5*  < > 2.2* 2.3* 2.2*  < > = values in this interval not displayed.  Cardiac Enzymes No  results for input(s): TROPONINI, PROBNP in the last 168 hours.  Glucose  Recent Labs Lab 03/08/16 2121 03/09/16 0801 03/09/16 1213 03/09/16 1641 03/09/16 2230 03/10/16 0758  GLUCAP 290* 296* 264* 251* 245* 298*    Imaging Dg Chest Port 1 View  Result Date: 03/09/2016 CLINICAL DATA:  Hypoxemia.  Pacemaker insertion. EXAM: PORTABLE CHEST 1 VIEW COMPARISON:  March 08, 2014 FINDINGS: Bilateral pulmonary opacities more focal in the right upper and mid lung, worsened in the interval. No other interval change. IMPRESSION: Worsening bilateral pulmonary opacities, right greater than left, with a consolidated appearance in the right upper lung. The findings are most worrisome for multifocal pneumonia. Edema is considered less likely. Electronically Signed   By: Gerome Samavid  Williams III M.D   On: 03/09/2016 14:45     STUDIES:  2D echo 1/21>>>poor images, ef 55%, Pa 33  CULTURES: 1/18 bc>> 1/21 PCR flu pos  ANTIBIOTICS: 1/18  vanc>> 1/18 Zoysn>> 1/21 tamiflu>>> 1/21 azithro>>>1/22  SIGNIFICANT EVENTS: 1/22 worsening clicnial status, needs itnuabtion  LINES/TUBES: 12/11 rt av graft>>  DISCUSSION: 71 yo obese(243lbs) former smoker(quit 8 years ago)with known LBB(PPM) PAF(on BB and coumadin) and CRF(base cret 3.5) who was admitted 1/18 with CC of SOB. He was admitted 12/17 and required HD vvia temp cath that was removed recently. His CxR shows diffuse edema and persistent rul infiltrate cw pna. PCCM was called 1/19 for AMS and resp failure requiring NIMVS. He was awake and alert, aggressive diuresis had been instituted and renal called. He does not need intubation at this time(may in future) and may need another HD cath as his rt av graft is not mature. We will leave him in SDU but if worsens we will move to ICU.  ASSESSMENT / PLAN:   Acute respiratory failure with hypoxia flu , ARDS R/o component edema Suspect OSA  COPD   P:   Requires intubation now Then ards protocol to keep plat elss 30 Will consider palysis x 48 hrs Would push for further neg baalnce Will need cvl likely and cvp Maintain lasix, keep metalazone abg , pcxr to follow after intubation pcxr in am  May need steroids for flu penumornits  Chronic renal failure with recent HD cath and HD 12/17 HD recently removed Immature AV graft P:   May need hd for neg balance Lasix mmax, metalazone  NPO -after intubation, start feeds  PNA -keep VOsyn Keep tamiflu dose adjusted -dc azithro  Cardiac -h/o fib 0-dc coum, add hep drip once inr less 2   FAMILY  - Updates: daughter updated at bedside 1/21, updated wife  - Inter-disciplinary family meet or Palliative Care meeting due by:  day 7   Ccm 40 min   Mcarthur Rossettianiel J. Tyson AliasFeinstein, MD, FACP Pgr: [phone] [place] [facility]

## 2016-03-10 NOTE — Progress Notes (Signed)
Results for [name], [name] (MRN [id number]) as of 03/10/2016 09:12  Ref. Range 03/09/2016 08:01 03/09/2016 12:13 03/09/2016 16:41 03/09/2016 22:30 03/10/2016 07:58  Glucose-Capillary Latest Ref Range: 65 - 99 mg/dL 409296 (H) 811264 (H) 914251 (H) 245 (H) 298 (H)  Noted that blood sugars continue to be elevated.  Recommend increasing Lantus to 30 units daily and increase Novolog to MODERATE correction scale TID & HS. Titrate insulin dosages as needed. Patient takes Lantus 52 units daily and Novolog 20 units TID at home.  Smith MinceKendra Urie Loughner RN BSN CDE

## 2016-03-10 NOTE — Procedures (Signed)
Central Venous Catheter Insertion Procedure Note [name] [id number] 1945/10/28  Procedure: Insertion of Central Venous Catheter Indications: Assessment of intravascular volume, Drug and/or fluid administration, Frequent blood sampling and possibel hd in future  Procedure Details Consent: Risks of procedure as well as the alternatives and risks of each were explained to the (patient/caregiver).  Consent for procedure obtained. Time Out: Verified patient identification, verified procedure, site/side was marked, verified correct patient position, special equipment/implants available, medications/allergies/relevent history reviewed, required imaging and test results available.  Performed  Maximum sterile technique was used including antiseptics, cap, gloves, gown, hand hygiene, mask and sheet. Skin prep: Chlorhexidine; local anesthetic administered A antimicrobial bonded/coated triple lumen catheter was placed in the right internal jugular vein using the Seldinger technique.  Evaluation Blood flow good Complications: No apparent complications Patient did tolerate procedure well. Chest X-ray ordered to verify placement.  CXR: pending.  Nelda BucksFEINSTEIN,DANIEL J. 03/10/2016, 4:08 PM  ffp during  US guidance  Mcarthur RossettiDaniel J. Tyson AliasFeinstein, MD, FACP Pgr: [phone] [facility]

## 2016-03-10 NOTE — Procedures (Signed)
Intubation Procedure Note [name] [id number] [birth year]  Procedure: Intubation Indications: Respiratory insufficiency  Procedure Details Consent: Risks of procedure as well as the alternatives and risks of each were explained to the (patient/caregiver).  Consent for procedure obtained. Time Out: Verified patient identification, verified procedure, site/side was marked, verified correct patient position, special equipment/implants available, medications/allergies/relevent history reviewed, required imaging and test results available.  Performed  Maximum sterile technique was used including antiseptics, cap, gloves, gown, hand hygiene and mask.  MAC and 4    Evaluation Hemodynamic Status: BP stable throughout; O2 sats: stable throughout Patient's Current Condition: stable Complications: No apparent complications Patient did tolerate procedure well. Chest X-ray ordered to verify placement.  CXR: pending.   [name] 03/10/2016  Bloody secretionsn oted after a traumatic intubation Proceed with bronch

## 2016-03-10 NOTE — Progress Notes (Addendum)
ANTICOAGULATION CONSULT NOTE  Pharmacy Consult for warfarin Indication: atrial fibrillation   Assessment: 71 yo M on warfarin PTA for afib. Last dose of warfarin 1/17, doses held d/t INR consistently elevated. Patient received vit k 1 mg PO yesterday. INR today is therapeutic at 2.59. CBC low stable, no overt bleeding noted.   PTA regimen: 2.5mg  daily, except none on MF  Goal of Therapy:  INR 2-3 Monitor platelets by anticoagulation protocol: Yes   Plan:  Give low dose warfarin 0.5 mg PO tonight Daily INR Monitor CBC, s/sx of bleeding   Mackie Paienee Ackley, PharmD PGY1 Pharmacy Resident Pager: [phone] 03/10/2016 8:18 AM    ADDENDUM: Patient is getting intubated today. Plan to hold warfarin and start heparin gtt once INR < 2

## 2016-03-10 NOTE — Progress Notes (Signed)
[name] Progress Note    Background:  71 y.o.year-old with history of DM, HTN, afib , diast CHF, HL and obesity, has CKD f/b kidney specialist at [facility]-->now being followed by [name] (missed appt last week d/t hosp) Admitted with SOB and decomp CHF. (Admitted early December to [facility] for CHF exac and was refractory to high-dose diuretics.  Required dialysis X 5  for pulm edema but was able to leave off dialysis with creatinine in mid 4's).  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 Came in on current occasion 1/18 as a transfer from [place] viral URI type symptoms and became increasingly SOB.  Went to [facility] ED where he was placed on BiPaP and received Lasix 160 IV x 1.  Renal was asked to see for vol overload management in CKD.    Assessment/ Plan:    1. Acute resp failure - PNA + CHF. Required intubation today. CXR worrisome for multifocal PNA.with R lung consolidation. Had bronch earlier.  ARDS protocol. ATB's per CCM. Continuing max lasix + metolazone for now. Fair UOP.  Continue to try and diurese with high-dose lasix, metolazone. K low and needs replaced (ordered). Will probably need RRT intervention next 24-48 hours and unclear at this time if will be HD or CRRT.  2. CKD IV/V - Creatinine 4.19 est GFR around 12 (3.96 yest - minimal change in GFR) (some better than last admission actually - was running mid 4's - around 4.5 at time of discharge 12/15).  Had 1st stage BVT AVF done 12/11. Was due for VVS F/U this coming Friday - can get while here (once stable).  Good possibility given deterioration in pulm status that renal function will tip over and require RRT. No indication today.  3. HTN: metoprolol  Subjective:    Intubated this AM Daughter and wife in the room Updated them on pt's renal status   Objective:   BP 107/71   Pulse (!) 41   Temp 97.8 F (36.6 C) (Oral)   Resp (!) 0   Ht 5' 11"  (1.803 m)   Wt 111.2 kg (245 lb 2.4 oz)   SpO2 97%    BMI 34.19 kg/m   Intake/Output Summary (Last 24 hours) at 03/10/16 1343 Last data filed at 03/10/16 1200  Gross per 24 hour  Intake           694.88 ml  Output             1300 ml  Net          -605.12 ml   Weight change:   Physical Exam: Intubated, with bloody secretions coming from ET tube Sedated Cannot see neck veins Sclerae not icteric Coarse BL, reduced in L upper lung fields S1S2 No S3 or S4 ABD obese, soft EXT only trace LE edema, somewhat improved Neuro - unable to assess. Intubated and sedated  Imaging: Dg Chest Port 1 View  Result Date: 03/10/2016 CLINICAL DATA:  Hypoxia EXAM: PORTABLE CHEST 1 VIEW COMPARISON:  March 09, 2016 FINDINGS: Endotracheal tube tip is 2.0 cm above the carina. Nasogastric tube tip and side port are below the diaphragm. Pacemaker leads are attached the right atrium and right ventricle. No pneumothorax. There is widespread interstitial and alveolar edema. Consolidation is greatest throughout the right upper lobe. There is mild cardiomegaly with pulmonary venous hypertension. There are questionable small pleural effusions bilaterally. There is atherosclerotic calcification in the aorta. No adenopathy demonstrable. IMPRESSION: Tube positions as  described without pneumothorax. Widespread interstitial and alveolar edema consistent with congestive heart failure. Cannot exclude superimposed pneumonia right upper lobe. The appearance is stable in this area. Stable cardiac silhouette.  There is aortic atherosclerosis. Electronically Signed   By: Lowella Grip III M.D.   On: 03/10/2016 10:31   Dg Chest Port 1 View  Result Date: 03/09/2016 CLINICAL DATA:  Hypoxemia.  Pacemaker insertion. EXAM: PORTABLE CHEST 1 VIEW COMPARISON:  March 08, 2014 FINDINGS: Bilateral pulmonary opacities more focal in the right upper and mid lung, worsened in the interval. No other interval change. IMPRESSION: Worsening bilateral pulmonary opacities, right greater than left,  with a consolidated appearance in the right upper lung. The findings are most worrisome for multifocal pneumonia. Edema is considered less likely. Electronically Signed   By: Dorise Bullion III M.D   On: 03/09/2016 14:45   Dg Chest Portable 1 View  Result Date: 03/08/2016 CLINICAL DATA:  Respiratory failure. EXAM: PORTABLE CHEST 1 VIEW COMPARISON:  March 06, 2016 FINDINGS: Stable cardiomegaly. Increased opacity throughout the right lung, most prominent in the right upper lobe. The opacity on the right is asymmetric to the left where there is only minimal interstitial prominence. No other changes. IMPRESSION: 1. Diffuse opacity throughout the right lung most focal in the right upper lobe. I am suspicious for pneumonia in the right lung in the setting of mild background edema. Electronically Signed   By: Dorise Bullion III M.D   On: 03/08/2016 18:13    Labs: BMET  Recent Labs Lab 03/07/16 6789 03/08/16 3810 03/08/16 1626 03/09/16 0424 03/09/16 1755 03/10/16 0512 03/10/16 0956 03/10/16 1527  NA 142 135 136 135 133* 135  --  135  K 4.0 3.9 4.0 3.2* 3.6 3.0*  --  3.1*  CL 104 99* 101 97* 95* 95*  --  96*  CO2 25 18* 18* 24 21* 20*  --  21*  GLUCOSE 209* 345* 334* 286* 264* 277*  --  346*  BUN 85* 98* 99* 95* 98* 104*  --  108*  CREATININE 3.50* 3.65* 3.77* 3.69* 3.96* 4.19*  --  4.43*  CALCIUM 9.1 9.0 9.1 8.9 9.0 9.1  --  8.9  PHOS  --  4.7* 4.5 4.6 4.7* 5.3* 5.5* 5.9*  5.7*   CBC  Recent Labs Lab 03/06/16 1236 03/07/16 0322 03/08/16 0955 03/09/16 0424 03/10/16 0512  WBC 7.6 5.6 8.4 7.9 8.8  NEUTROABS 5.6  --   --   --   --   HGB 8.6* 9.0* 8.3* 8.0* 8.3*  HCT 29.5* 30.0* 27.6* 26.3* 27.3*  MCV 97.7 95.5 94.2 92.3 92.9  PLT 181 156 164 170 176    Medications:    . chlorhexidine gluconate (MEDLINE KIT)  15 mL Mouth Rinse BID  . feeding supplement (PRO-STAT SUGAR FREE 64)  30 mL Per Tube 5 X Daily  . feeding supplement (VITAL HIGH PROTEIN)  1,000 mL Per Tube Q24H  .  furosemide  160 mg Intravenous Q8H  . insulin aspart  0-9 Units Subcutaneous Q4H  . insulin glargine  20 Units Subcutaneous Daily  . ipratropium-albuterol  3 mL Nebulization Q6H  . levothyroxine  25 mcg Oral Once per day on Mon Tue Wed Thu Fri  . levothyroxine  50 mcg Oral Once per day on Sun Sat  . mouth rinse  15 mL Mouth Rinse 10 times per day  . methylPREDNISolone (SOLU-MEDROL) injection  40 mg Intravenous Q12H  . metolazone  10 mg Oral 2 times per  day  . metoprolol tartrate  12.5 mg Per Tube BID  . oseltamivir  30 mg Oral QHS  . piperacillin-tazobactam (ZOSYN)  IV  3.375 g Intravenous Q8H  . sertraline  100 mg Oral Daily  . vancomycin  1,500 mg Intravenous Q48H   Jamal Maes, MD [place] Pager 03/10/2016, 5:42 PM

## 2016-03-10 NOTE — Procedures (Signed)
Bronchoscopy Procedure Note KAELEM BRACH [id number] [birth year]  Procedure: Bronchoscopy Indications: Diagnostic evaluation of the airways and Obtain specimens for culture and/or other diagnostic studies  Procedure Details Consent: Unable to obtain consent because of emergent medical necessity. Time Out: Verified patient identification, verified procedure, site/side was marked, verified correct patient position, special equipment/implants available, medications/allergies/relevent history reviewed, required imaging and test results available.  Performed  In preparation for procedure, patient was given 100% FiO2 and bronchoscope lubricated. Sedation: Etomidate  Airway entered and the following bronchi were examined: RUL, RML, RLL, LUL, LLL and Bronchi.   Procedures performed: Brushings performed - no Bronchoscope removed.  , Patient placed back on 100% FiO2 at conclusion of procedure.    Evaluation Hemodynamic Status: BP stable throughout; O2 sats: stable throughout Patient's Current Condition: stable Specimens:  Sent serosanguinous fluid -bloody frothy Complications: No apparent complications Patient did tolerate procedure well.   [name]. 03/10/2016   1. Diffuse bleeding, clears with scutioning, indicating NOT DAH, likley from upper airway at some time, had no trauma on intubation 2. BAL RUL, bloody 3. No active bleeding now  ffp for line anyway  Lavon Paganini. Titus Mould, MD, [place] Pgr: [facility]

## 2016-03-10 NOTE — Progress Notes (Signed)
ANTIBIOTIC CONSULT NOTE   Pharmacy Consult for Vancomycin Indication: pneumonia  Allergies  Allergen Reactions  . No Known Allergies     Patient Measurements: Height: 5\' 11"  (180.3 cm) Weight: 245 lb 2.4 oz (111.2 kg) IBW/kg (Calculated) : 75.3 Adjusted Body Weight:   Vital Signs: Temp: 97.5 F (36.4 C) (01/22 1853) Temp Source: Oral (01/22 1853) BP: 112/50 (01/22 1853) Pulse Rate: 74 (01/22 1853) Intake/Output from previous day: 01/21 0701 - 01/22 0700 In: 630.5 [P.O.:120; IV Piggyback:510.5] Out: 1100 [Urine:1100] Intake/Output from this shift: No intake/output data recorded.  Labs:  Recent Labs  03/08/16 0955  03/09/16 0424 03/09/16 1755 03/10/16 0512 03/10/16 1527  WBC 8.4  --  7.9  --  8.8  --   HGB 8.3*  --  8.0*  --  8.3*  --   PLT 164  --  170  --  176  --   CREATININE 3.65*  < > 3.69* 3.96* 4.19* 4.43*  < > = values in this interval not displayed. Estimated Creatinine Clearance: 19.7 mL/min (by C-G formula based on SCr of 4.43 mg/dL (H)).  Recent Labs  03/10/16 1805  VANCOTROUGH 27*     Microbiology:   Medical History: Past Medical History:  Diagnosis Date  . Cardiac pacemaker in situ 08/03/2012   Indications transient complete heart block Insertion 08/03/12 Dr. Graciela HusbandsKlein  LV lead Medtronic 5076 serial number [id number]  LA lead Medtronic 5076 serial number [id number] Medtronic MRI compatible pulse generator serial number [id number].       . CHF (congestive heart failure) (HCC)   . Chronic diastolic heart failure (HCC)   . Chronic kidney disease (CKD), stage III (moderate) 08/02/2012  . COPD (chronic obstructive pulmonary disease) (HCC)   . 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 Hyperlipidemia   . Hypertension   . Hypertensive heart disease   . Hypothyroidism   . 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 LBBB (left bundle branch block) 08/02/2012  . Obesity (BMI 30-39.9)     Assessment:  ID: Abx D#6.  Persistent right upper lobe infiltrate - pna.  Afebrile, WBC wnl, LA trend down 1.7, Procal 0.33. Worsening Scr and UOP. Vanco trough elevated at 27.  Antimicrobials this admission: Vanc 1/18 >>  Zosyn 1/18 >>  Azith 1/21 >>1/22 Tamilfu 1/21>>(1/26)  Dose adjustments this admission: 1/22: Decrease Zosyn 1/22: VT 27: Hold scheduled doses  Microbiology results: 1/18 BCx: ngtd 1/18 MRSA PCR: neg 1/21 Flu A: positive 1/21 Resp panel: neg  Goal of Therapy:  Vancomycin trough level 15-20 mcg/ml  Plan:  D/c scheduled Vancomycin doses Check random level Wed AM to assess elimination.   [name] S. Merilynn Finlandobertson, PharmD, BCPS Clinical Staff Pharmacist Pager [phone]  Misty Stanleyobertson, Saara Kijowski Stillinger 03/10/2016,7:27 PM

## 2016-03-11 ENCOUNTER — Inpatient Hospital Stay (HOSPITAL_COMMUNITY): Payer: Non-veteran care

## 2016-03-11 ENCOUNTER — Encounter (HOSPITAL_COMMUNITY): Payer: Medicare HMO

## 2016-03-11 LAB — RENAL FUNCTION PANEL
Albumin: 2.1 g/dL — ABNORMAL LOW (ref 3.5–5.0)
Albumin: 2.5 g/dL — ABNORMAL LOW (ref 3.5–5.0)
Anion gap: 17 — ABNORMAL HIGH (ref 5–15)
Anion gap: 14 (ref 5–15)
BUN: 123 mg/dL — ABNORMAL HIGH (ref 6–20)
BUN: 114 mg/dL — AB (ref 6–20)
CHLORIDE: 94 mmol/L — AB (ref 101–111)
CHLORIDE: 98 mmol/L — AB (ref 101–111)
CO2: 23 mmol/L (ref 22–32)
CO2: 25 mmol/L (ref 22–32)
CREATININE: 5.14 mg/dL — AB (ref 0.61–1.24)
CREATININE: 4.51 mg/dL — AB (ref 0.61–1.24)
Calcium: 9.6 mg/dL (ref 8.9–10.3)
Calcium: 9.5 mg/dL (ref 8.9–10.3)
GFR calc non Af Amer: 10 mL/min — ABNORMAL LOW (ref >60)
GFR, EST AFRICAN AMERICAN: 12 mL/min — AB (ref >60)
GFR, EST AFRICAN AMERICAN: 14 mL/min — AB (ref >60)
GFR, EST NON AFRICAN AMERICAN: 12 mL/min — AB (ref >60)
Glucose, Bld: 399 mg/dL — ABNORMAL HIGH (ref 65–99)
Glucose, Bld: 143 mg/dL — ABNORMAL HIGH (ref 65–99)
POTASSIUM: 3.5 mmol/L (ref 3.5–5.1)
POTASSIUM: 4 mmol/L (ref 3.5–5.1)
Phosphorus: 5.8 mg/dL — ABNORMAL HIGH (ref 2.5–4.6)
Phosphorus: 5.9 mg/dL — ABNORMAL HIGH (ref 2.5–4.6)
Sodium: 134 mmol/L — ABNORMAL LOW (ref 135–145)
Sodium: 137 mmol/L (ref 135–145)

## 2016-03-11 LAB — CBC
HEMATOCRIT: 25.6 % — AB (ref 39.0–52.0)
Hemoglobin: 7.6 g/dL — ABNORMAL LOW (ref 13.0–17.0)
MCH: 28 pg (ref 26.0–34.0)
MCHC: 29.7 g/dL — ABNORMAL LOW (ref 30.0–36.0)
MCV: 94.5 fL (ref 78.0–100.0)
PLATELETS: 186 10*3/uL (ref 150–400)
RBC: 2.71 MIL/uL — AB (ref 4.22–5.81)
RDW: 18.4 % — AB (ref 11.5–15.5)
WBC: 5.5 10*3/uL (ref 4.0–10.5)

## 2016-03-11 LAB — GLUCOSE, CAPILLARY
GLUCOSE-CAPILLARY: 500 mg/dL — AB (ref 65–99)
GLUCOSE-CAPILLARY: 446 mg/dL — AB (ref 65–99)
GLUCOSE-CAPILLARY: 361 mg/dL — AB (ref 65–99)
GLUCOSE-CAPILLARY: 310 mg/dL — AB (ref 65–99)
GLUCOSE-CAPILLARY: 255 mg/dL — AB (ref 65–99)
GLUCOSE-CAPILLARY: 210 mg/dL — AB (ref 65–99)
GLUCOSE-CAPILLARY: 114 mg/dL — AB (ref 65–99)
GLUCOSE-CAPILLARY: 161 mg/dL — AB (ref 65–99)
GLUCOSE-CAPILLARY: 220 mg/dL — AB (ref 65–99)
GLUCOSE-CAPILLARY: 224 mg/dL — AB (ref 65–99)
Glucose-Capillary: 357 mg/dL — ABNORMAL HIGH (ref 65–99)
Glucose-Capillary: 427 mg/dL — ABNORMAL HIGH (ref 65–99)
Glucose-Capillary: 423 mg/dL — ABNORMAL HIGH (ref 65–99)
Glucose-Capillary: 151 mg/dL — ABNORMAL HIGH (ref 65–99)
Glucose-Capillary: 173 mg/dL — ABNORMAL HIGH (ref 65–99)
Glucose-Capillary: 138 mg/dL — ABNORMAL HIGH (ref 65–99)
Glucose-Capillary: 106 mg/dL — ABNORMAL HIGH (ref 65–99)
Glucose-Capillary: 119 mg/dL — ABNORMAL HIGH (ref 65–99)
Glucose-Capillary: 135 mg/dL — ABNORMAL HIGH (ref 65–99)
Glucose-Capillary: 202 mg/dL — ABNORMAL HIGH (ref 65–99)
Glucose-Capillary: 219 mg/dL — ABNORMAL HIGH (ref 65–99)
Glucose-Capillary: 210 mg/dL — ABNORMAL HIGH (ref 65–99)

## 2016-03-11 LAB — TRIGLYCERIDES: TRIGLYCERIDES: 112 mg/dL (ref <150)

## 2016-03-11 LAB — BLOOD GAS, ARTERIAL
ACID-BASE DEFICIT: 3.8 mmol/L — AB (ref 0.0–2.0)
Bicarbonate: 21.2 mmol/L (ref 20.0–28.0)
Drawn by: 257701
FIO2: 50
O2 SAT: 97.9 %
PEEP: 5 cmH2O
PH ART: 7.321 — AB (ref 7.350–7.450)
Patient temperature: 98.6
RATE: 32 resp/min
VT: 450 mL
pCO2 arterial: 42.4 mmHg (ref 32.0–48.0)
pO2, Arterial: 115 mmHg — ABNORMAL HIGH (ref 83.0–108.0)

## 2016-03-11 LAB — POCT I-STAT 3, ART BLOOD GAS (G3+)
Acid-base deficit: 3 mmol/L — ABNORMAL HIGH (ref 0.0–2.0)
BICARBONATE: 23.5 mmol/L (ref 20.0–28.0)
O2 Saturation: 87 %
PCO2 ART: 49.8 mmHg — AB (ref 32.0–48.0)
Patient temperature: 97.9
TCO2: 25 mmol/L (ref 0–100)
pH, Arterial: 7.28 — ABNORMAL LOW (ref 7.350–7.450)
pO2, Arterial: 59 mmHg — ABNORMAL LOW (ref 83.0–108.0)

## 2016-03-11 LAB — BASIC METABOLIC PANEL
Anion gap: 18 — ABNORMAL HIGH (ref 5–15)
BUN: 115 mg/dL — AB (ref 6–20)
CHLORIDE: 93 mmol/L — AB (ref 101–111)
CO2: 21 mmol/L — AB (ref 22–32)
Calcium: 9.3 mg/dL (ref 8.9–10.3)
Creatinine, Ser: 4.92 mg/dL — ABNORMAL HIGH (ref 0.61–1.24)
GFR calc Af Amer: 13 mL/min — ABNORMAL LOW (ref >60)
GFR calc non Af Amer: 11 mL/min — ABNORMAL LOW (ref >60)
Glucose, Bld: 483 mg/dL — ABNORMAL HIGH (ref 65–99)
POTASSIUM: 4.1 mmol/L (ref 3.5–5.1)
SODIUM: 132 mmol/L — AB (ref 135–145)

## 2016-03-11 LAB — PROTIME-INR
INR: 2.25
Prothrombin Time: 25.2 seconds — ABNORMAL HIGH (ref 11.4–15.2)

## 2016-03-11 LAB — CULTURE, BLOOD (ROUTINE X 2)
CULTURE: NO GROWTH
CULTURE: NO GROWTH

## 2016-03-11 LAB — MPO/PR-3 (ANCA) ANTIBODIES: Myeloperoxidase Abs: <9 U/mL (ref 0.0–9.0)

## 2016-03-11 LAB — PREPARE FRESH FROZEN PLASMA
BLOOD PRODUCT EXPIRATION DATE: 201801262359
BLOOD PRODUCT EXPIRATION DATE: 201801262359
ISSUE DATE / TIME: 201801221525
ISSUE DATE / TIME: 201801221710
UNIT TYPE AND RH: 7300
Unit Type and Rh: 7300

## 2016-03-11 LAB — ANTI-DNA ANTIBODY, DOUBLE-STRANDED: ds DNA Ab: <1 IU/mL (ref 0–9)

## 2016-03-11 LAB — PROCALCITONIN: PROCALCITONIN: 0.57 ng/mL

## 2016-03-11 LAB — ANTINUCLEAR ANTIBODIES, IFA: ANTINUCLEAR ANTIBODIES, IFA: NEGATIVE

## 2016-03-11 LAB — MAGNESIUM: MAGNESIUM: 2.4 mg/dL (ref 1.7–2.4)

## 2016-03-11 MED ORDER — NOREPINEPHRINE BITARTRATE 1 MG/ML IV SOLN
0.0000 ug/min | INTRAVENOUS | Status: DC
  Administered 2016-03-11: 5 ug/min via INTRAVENOUS
  Administered 2016-03-12: 40 ug/min via INTRAVENOUS
  Administered 2016-03-12 (×2): 30 ug/min via INTRAVENOUS
  Administered 2016-03-13 – 2016-03-14 (×4): 40 ug/min via INTRAVENOUS
  Filled 2016-03-11 (×10): qty 16

## 2016-03-11 MED ORDER — PRISMASOL BGK 4/2.5 32-4-2.5 MEQ/L IV SOLN
INTRAVENOUS | Status: DC
  Administered 2016-03-11 – 2016-03-14 (×9): via INTRAVENOUS_CENTRAL
  Filled 2016-03-11 (×10): qty 5000

## 2016-03-11 MED ORDER — HEPARIN SODIUM (PORCINE) 1000 UNIT/ML DIALYSIS
1000.0000 [IU] | INTRAMUSCULAR | Status: DC | PRN
  Filled 2016-03-11: qty 6

## 2016-03-11 MED ORDER — DEXTROSE 5 % IV SOLN
2.0000 g | INTRAVENOUS | Status: DC
  Administered 2016-03-11 – 2016-03-14 (×4): 2 g via INTRAVENOUS
  Filled 2016-03-11 (×4): qty 2

## 2016-03-11 MED ORDER — SODIUM CHLORIDE 0.9 % FOR CRRT
INTRAVENOUS_CENTRAL | Status: DC | PRN
  Filled 2016-03-11: qty 1000

## 2016-03-11 MED ORDER — SODIUM CHLORIDE 0.9 % IV BOLUS (SEPSIS)
250.0000 mL | Freq: Once | INTRAVENOUS | Status: AC
  Administered 2016-03-11: 250 mL via INTRAVENOUS

## 2016-03-11 MED ORDER — PROPOFOL 1000 MG/100ML IV EMUL
0.0000 ug/kg/min | INTRAVENOUS | Status: DC
  Administered 2016-03-11: 70 ug/kg/min via INTRAVENOUS
  Administered 2016-03-11 (×2): 50 ug/kg/min via INTRAVENOUS
  Administered 2016-03-11: 5 ug/kg/min via INTRAVENOUS
  Administered 2016-03-12 (×3): 40 ug/kg/min via INTRAVENOUS
  Administered 2016-03-12: 30.047 ug/kg/min via INTRAVENOUS
  Filled 2016-03-11: qty 200
  Filled 2016-03-11: qty 300
  Filled 2016-03-11 (×2): qty 100

## 2016-03-11 MED ORDER — PRISMASOL BGK 4/2.5 32-4-2.5 MEQ/L IV SOLN
INTRAVENOUS | Status: DC
  Administered 2016-03-11 – 2016-03-14 (×22): via INTRAVENOUS_CENTRAL
  Filled 2016-03-11 (×29): qty 5000

## 2016-03-11 MED ORDER — SODIUM CHLORIDE 0.9 % IV SOLN
INTRAVENOUS | Status: DC
  Administered 2016-03-11: 4.4 [IU]/h via INTRAVENOUS
  Filled 2016-03-11 (×2): qty 2.5

## 2016-03-11 MED ORDER — PRISMASOL BGK 4/2.5 32-4-2.5 MEQ/L IV SOLN
INTRAVENOUS | Status: DC
  Administered 2016-03-11 – 2016-03-14 (×6): via INTRAVENOUS_CENTRAL
  Filled 2016-03-11 (×7): qty 5000

## 2016-03-11 MED ORDER — FAMOTIDINE 40 MG/5ML PO SUSR
20.0000 mg | Freq: Every day | ORAL | Status: DC
  Administered 2016-03-11 – 2016-03-14 (×4): 20 mg via ORAL
  Filled 2016-03-11 (×4): qty 2.5

## 2016-03-11 NOTE — Progress Notes (Signed)
eLink Physician-Brief Progress Note Patient Name: Zadie CleverlyMichael D Vensel DOB: 25-Jun-1945 MRN: [id number]   Date of Service  03/11/2016  HPI/Events of Note  Asynchronous with vent.   eICU Interventions  Will add propofol infusion for sedation.      Intervention Category Major Interventions: Hypoxemia - evaluation and management  Shane Crutchradeep Emilianna Barlowe 03/11/2016, 3:40 PM

## 2016-03-11 NOTE — Progress Notes (Signed)
Inpatient Diabetes Program Recommendations  AACE/ADA: New Consensus Statement on Inpatient Glycemic Control (2015)  Target Ranges:  Prepandial:   less than 140 mg/dL      Peak postprandial:   less than 180 mg/dL (1-2 hours)      Critically ill patients:  140 - 180 mg/dL   Lab Results  Component Value Date   GLUCAP 151 (H) 03/11/2016   HGBA1C 6.0 (H) 03/06/2016    Review of Glycemic ControlResults for [name], Guy D (MRN [id number]) as of 03/11/2016 09:37  Ref. Range 03/11/2016 01:54 03/11/2016 03:03 03/11/2016 04:06 03/11/2016 04:55 03/11/2016 05:58 03/11/2016 06:58 03/11/2016 08:02 03/11/2016 09:31  Glucose-Capillary Latest Ref Range: 65 - 99 mg/dL 782446 (H) 956427 (H) 213423 (H) 361 (H) 310 (H) 255 (H) 210 (H) 151 (H)    Diabetes history: Type 2 diabetes Outpatient Diabetes medications: Novolog 20 units tid with meals, Lantus 52 units at HS Current orders for Inpatient glycemic control:  IV insulin per ICU glycemic control order set  Inpatient Diabetes Program Recommendations:    Agree with orders for ICU order set and IV insulin.  Will follow.  Thanks, Beryl MeagerJenny Kadijah Shamoon, RN, BC-ADM Inpatient Diabetes Coordinator Pager [phone] (8a-5p)

## 2016-03-11 NOTE — Care Management Note (Signed)
Case Management Note  Patient Details  Name: Zadie CleverlyMichael D Mowers MRN: [id number] Date of Birth: December 25, 1945  Subjective/Objective:    Acute resp failure with hypoxia, CRF       Action/Plan: Discharge Planning: Chart reviewed. Pt at home with wife. Waiting for final recommendations. Will continue to follow for dc needs.  PCP Marlowe ShoresVAIDYA, RAKESHCHANDRA S   Expected Discharge Date:                  Expected Discharge Plan:  Skilled Nursing Facility  In-House Referral:  NA  Discharge planning Services  CM Consult  Post Acute Care Choice:  NA Choice offered to:     DME Arranged:    DME Agency:     HH Arranged:    HH Agency:     Status of Service:  In process, will continue to follow  If discussed at Long Length of Stay Meetings, dates discussed:  03/11/2016   Additional Comments:  Elliot CousinShavis, Yoskar Murrillo Ellen, RN 03/11/2016, 3:22 PM

## 2016-03-11 NOTE — Care Management Important Message (Signed)
Important Message  Patient Details  Name: Zachary CleverlyMichael D Scipio MRN: [id number] Date of Birth: [birth year]   Medicare Important Message Given:  Yes    Elliot CousinShavis, Sherae Santino Ellen, RN 03/11/2016, 3:25 PM

## 2016-03-11 NOTE — Progress Notes (Signed)
This note also relates to the following rows which could not be included: SpO2 - Cannot attach notes to unvalidated device data  Vent changes made per Dr. Gwendolyn GrantFeinstein's verbal request.

## 2016-03-11 NOTE — Progress Notes (Signed)
[facility] Progress Note    Subjective:    Flu + Agitated during wake up assessment UOP waning despite high dose lasix and BID metolazone HD cath placed yesterday by CCM   Objective:   BP (!) 102/34   Pulse 85   Temp 97.9 F (36.6 C) (Oral)   Resp (!) 26   Ht 5' 11"  (1.803 m)   Wt 112.6 kg (248 lb 3.8 oz)   SpO2 94%   BMI 34.62 kg/m   Intake/Output Summary (Last 24 hours) at 03/11/16 0843 Last data filed at 03/11/16 0700  Gross per 24 hour  Intake          2240.96 ml  Output              351 ml  Net          1889.96 ml   Weight change: 1.4 kg (3 lb 1.4 oz)  Weight Summary                    03/11/16  112.6 kg  CRRT initiated  03/10/16   111.2 kg     03/07/16   110.5 kg       Physical Exam: Intubated, agitated CVP 25 Seen during wake up assessment R IJ temp cath with bleeding at insertion site L sided pacer in place Cannot see neck veins Sclerae not icteric Coarse breath sounds with crackles/rhonchi S1S2 No S3 or S4  Distant heart sounds, obscured by resp noise Abd obese, soft Trace LE edema RUE upper arm AVF with + bruit, difficult to feel Neuro - unable to assess.   Imaging: Dg Chest Port 1 View  Result Date: 03/11/2016 CLINICAL DATA:  Assess endotracheal tube EXAM: PORTABLE CHEST 1 VIEW COMPARISON:  03/10/2016 FINDINGS: The endotracheal tube has been advanced and is now 1.5 cm from the carina. Double lead left subclavian pacemaker device is stable and intact. Right jugular venous catheter stable. NG tube tip is beyond the gastroesophageal junction. Extensive bilateral airspace disease worse on the right is stable. Low volumes. Heart is obscured. No pneumothorax. IMPRESSION: Endotracheal tube advanced.  Tip is now a 1.5 cm from the carina. Stable diffuse airspace disease. Electronically Signed   By: Marybelle Killings M.D.   On: 03/11/2016 07:12   Dg Chest Port 1 View  Result Date: 03/10/2016 CLINICAL DATA:  Status post central line  placement EXAM: PORTABLE CHEST 1 VIEW COMPARISON:  Portable chest x-ray of today's date FINDINGS: A large caliber right internal jugular venous catheter is been placed with the tip projecting over the midportion of the SVC. No postprocedure pneumothorax is observed. The pulmonary interstitial markings remain increased diffusely greatest on the right with areas of confluence. The cardiac silhouette is obscured. The pulmonary vascularity is indistinct. There is dense calcification in the wall of the aortic arch. The endotracheal tube tip lies 4.9 cm above the carina. The esophagogastric tube tip projects below the inferior margin of the image. The permanent pacemaker is in stable position. IMPRESSION: No postprocedure complication following placement of the right internal jugular venous catheter. Persistent diffuse interstitial and alveolar infiltrates consistent with pneumonia or edema. Electronically Signed   By: David  Martinique M.D.   On: 03/10/2016 17:32   Dg Chest Port 1 View  Result Date: 03/10/2016 CLINICAL DATA:  Hypoxia EXAM: PORTABLE CHEST 1 VIEW COMPARISON:  March 09, 2016 FINDINGS: Endotracheal tube tip is 2.0 cm above the carina. Nasogastric tube tip and side port are below the diaphragm.  Pacemaker leads are attached the right atrium and right ventricle. No pneumothorax. There is widespread interstitial and alveolar edema. Consolidation is greatest throughout the right upper lobe. There is mild cardiomegaly with pulmonary venous hypertension. There are questionable small pleural effusions bilaterally. There is atherosclerotic calcification in the aorta. No adenopathy demonstrable. IMPRESSION: Tube positions as described without pneumothorax. Widespread interstitial and alveolar edema consistent with congestive heart failure. Cannot exclude superimposed pneumonia right upper lobe. The appearance is stable in this area. Stable cardiac silhouette.  There is aortic atherosclerosis. Electronically Signed    By: Lowella Grip III M.D.   On: 03/10/2016 10:31   Dg Chest Port 1 View  Result Date: 03/09/2016 CLINICAL DATA:  Hypoxemia.  Pacemaker insertion. EXAM: PORTABLE CHEST 1 VIEW COMPARISON:  March 08, 2014 FINDINGS: Bilateral pulmonary opacities more focal in the right upper and mid lung, worsened in the interval. No other interval change. IMPRESSION: Worsening bilateral pulmonary opacities, right greater than left, with a consolidated appearance in the right upper lung. The findings are most worrisome for multifocal pneumonia. Edema is considered less likely. Electronically Signed   By: Dorise Bullion III M.D   On: 03/09/2016 14:45     Recent Labs Lab 03/08/16 1626 03/09/16 0424 03/09/16 1755 03/10/16 7628 03/10/16 0956 03/10/16 1527 03/11/16 0024 03/11/16 0446  NA 136 135 133* 135  --  135 132* 134*  K 4.0 3.2* 3.6 3.0*  --  3.1* 4.1 3.5  CL 101 97* 95* 95*  --  96* 93* 94*  CO2 18* 24 21* 20*  --  21* 21* 23  GLUCOSE 334* 286* 264* 277*  --  346* 483* 399*  BUN 99* 95* 98* 104*  --  108* 115* 123*  CREATININE 3.77* 3.69* 3.96* 4.19*  --  4.43* 4.92* 5.14*  CALCIUM 9.1 8.9 9.0 9.1  --  8.9 9.3 9.6  PHOS 4.5 4.6 4.7* 5.3* 5.5* 5.9*  5.7*  --  5.8*    Recent Labs Lab 03/06/16 1236  03/08/16 0955 03/09/16 0424 03/10/16 0512 03/11/16 0446  WBC 7.6  < > 8.4 7.9 8.8 5.5  NEUTROABS 5.6  --   --   --   --   --   HGB 8.6*  < > 8.3* 8.0* 8.3* 7.6*  HCT 29.5*  < > 27.6* 26.3* 27.3* 25.6*  MCV 97.7  < > 94.2 92.3 92.9 94.5  PLT 181  < > 164 170 176 186  < > = values in this interval not displayed.  Results for RICK, CARRUTHERS (MRN [id number]) as of 03/11/2016 08:47  Ref. Range 03/09/2016 14:54  Influenza A By PCR Latest Ref Range: NEGATIVE  POSITIVE (A)    Medications:    . chlorhexidine gluconate (MEDLINE KIT)  15 mL Mouth Rinse BID  . feeding supplement (PRO-STAT SUGAR FREE 64)  30 mL Per Tube 5 X Daily  . feeding supplement (VITAL HIGH PROTEIN)  1,000 mL Per Tube  Q24H  . furosemide  160 mg Intravenous Q8H  . ipratropium-albuterol  3 mL Nebulization Q6H  . levothyroxine  25 mcg Oral Once per day on Mon Tue Wed Thu Fri  . levothyroxine  50 mcg Oral Once per day on Sun Sat  . mouth rinse  15 mL Mouth Rinse 10 times per day  . methylPREDNISolone (SOLU-MEDROL) injection  40 mg Intravenous Q12H  . metolazone  10 mg Oral 2 times per day  . metoprolol tartrate  12.5 mg Per Tube BID  . oseltamivir  30 mg Oral QHS  . piperacillin-tazobactam (ZOSYN)  IV  2.25 g Intravenous Q8H  . sertraline  100 mg Oral Daily    Background:  71 y.o.year-old with history of DM, HTN, afib , diast CHF, HL and obesity, has CKD-->now being followed by Dr. Joelyn Oms (missed appt last week d/t hosp) Admitted with SOB and decomp CHF. (Admitted early December to [facility] for CHF exac and was refractory to high-dose diuretics.  Required dialysis X 5  for pulm edema but was able to leave off dialysis with creatinine in mid 4's).  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 Came in on current occasion 1/18 as a transfer from [place] viral URI type symptoms and became increasingly SOB.  Went to [facility] ED where he was placed on BiPaP and received Lasix 160 IV.  Renal was asked to see for vol overload management in CKD. Subsequently required intubation, tested + for Flu.   Assessment/ Plan:    1. Acute resp failure with hypoxemia ->Flu/PNA/possible ARDS and likely component of edema.  Tamiflu/ATB's per CCM. CVP up to 25. Needs volume off - UOP waning with high dose diuretics.  1. Will need RRT for volume removal (and azotemia with worsening RF). Favor CRRT - discussed with CCM.  2. Will start that today - neg 200/hour as BP tolerates/no heparin, all 4K fluids.. Stop lasix and metolazone.  2. CKD IV/V - Creatinine continues to rise in face of acute resp illness. Needs RRT now for both volume and azotemia. Unclear if this will tip him over the edge to permanent HD dependence. Has 1st stage of  BVT AVF.  1. Order AVF duplex   2. VVS f/u (had been scheduled as outpt this week). Will need 2nd stage done when more stable.   3. H/o AF - metoprolol  Jamal Maes, MD [facility] [phone] Pager 03/11/2016, 8:43 AM

## 2016-03-11 NOTE — Progress Notes (Signed)
eLink Physician-Brief Progress Note Patient Name: Zadie CleverlyMichael D Molony DOB: 08-Feb-1946 MRN: [id number]   Date of Service  03/11/2016  HPI/Events of Note  CBG 467  eICU Interventions  Start insulin gtt     Intervention Category Intermediate Interventions: Hyperglycemia - evaluation and treatment  ALVA,RAKESH V. 03/11/2016, 12:26 AM

## 2016-03-11 NOTE — Progress Notes (Addendum)
PULMONARY / CRITICAL CARE MEDICINE   Name: [name] MRN: [id number] DOB: [birth year]    ADMISSION DATE:  03/06/2016 CONSULTATION DATE:  1/19  REFERRING MD:  [facility]  CHIEF COMPLAINT:  AMS  HISTORY OF PRESENT ILLNESS:   71 yo obese(243lbs) former smoker(quit 8 years ago)with known LBB(PPM) PAF(on BB and coumadin) and CRF(base cret 3.5) who was admitted 1/18 with CC of SOB. He was admitted 12/17 and required HD vvia temp cath that was removed recently. His CxR shows diffuse edema and persistent rul infiltrate cw pna. PCCM was called 1/19 for AMS and resp failure requiring NIMVS. He was awake and alert, aggressive diuresis had been instituted and renal called. He does not need intubation at this time (may in future) and may need another HD cath as his rt av graft is not mature. We will leave him in SDU but if worsens we will move to ICU.   SUBJECTIVE:  Intubated yesterday. CBGs in 400s overnight requiring insulin gtt. Nurse reports extremities very stiff. UOP only 450, Cr rising.  VITAL SIGNS: BP (!) 99/18 (BP Location: Left Leg)   Pulse 79   Temp 99.2 F (37.3 C) (Oral)   Resp (!) 23   Ht 5\' 11"  (1.803 m)   Wt 248 lb 3.8 oz (112.6 kg)   SpO2 97%   BMI 34.62 kg/m   HEMODYNAMICS: CVP:  [22 mmHg-25 mmHg] 25 mmHg  VENTILATOR SETTINGS: Vent Mode: PRVC FiO2 (%):  [40 %-100 %] 50 % Set Rate:  [20 bmp-26 bmp] 26 bmp Vt Set:  [520 mL-590 mL] 520 mL PEEP:  [5 cmH20] 5 cmH20 Plateau Pressure:  [21 cmH20-29 cmH20] 24 cmH20  INTAKE / OUTPUT: I/O last 3 completed shifts: In: 1759.8 [P.O.:120; I.V.:126.7; Blood:552; NG/GT:334.7; IV Piggyback:626.5] Out: 1300 [Urine:1300]  PHYSICAL EXAMINATION: General:  Ill appearing man sedated and intubated  Neuro:  Sedated on vent. Responds to painful stimuli HEENT: [place]/AT, sclera anicteric, ETT in place Cardiovascular:  RRR, no m/g/r Lungs: coarse BS bilaterally Abdomen: BS+, soft, obese Musculoskeletal: warm and dry, trace bilateral LE  edema  LABS:  BMET  Recent Labs Lab 03/10/16 1527 03/11/16 0024 03/11/16 0446  NA 135 132* 134*  K 3.1* 4.1 3.5  CL 96* 93* 94*  CO2 21* 21* 23  BUN 108* 115* 123*  CREATININE 4.43* 4.92* 5.14*  GLUCOSE 346* 483* 399*    Electrolytes  Recent Labs Lab 03/10/16 0956 03/10/16 1527 03/11/16 0024 03/11/16 0446  CALCIUM  --  8.9 9.3 9.6  MG 2.1 2.2  --  2.4  PHOS 5.5* 5.9*  5.7*  --  5.8*    CBC  Recent Labs Lab 03/09/16 0424 03/10/16 0512 03/11/16 0446  WBC 7.9 8.8 5.5  HGB 8.0* 8.3* 7.6*  HCT 26.3* 27.3* 25.6*  PLT 170 176 186    Coag's  Recent Labs Lab 03/09/16 0424 03/10/16 0512 03/11/16 0446  INR 5.84* 2.59 2.25    Sepsis Markers  Recent Labs Lab 03/06/16 1845 03/06/16 2232 03/07/16 0322 03/07/16 1232 03/09/16 0424 03/11/16 0446  LATICACIDVEN 1.6 2.4* 1.7  --   --   --   PROCALCITON  --   --   --  0.22 0.33 0.57    ABG  Recent Labs Lab 03/06/16 1820 03/07/16 0250 03/10/16 1015  PHART 7.404 7.393 7.343*  PCO2ART 40 42.0 43.3  PO2ART 155* 123* 422*    Liver Enzymes  Recent Labs Lab 03/06/16 1236 03/07/16 0322  03/09/16 1118  03/10/16 0512 03/10/16 1527 03/11/16  0446  AST 40 32  --  19  --   --   --   --   ALT 63 50  --  31  --   --   --   --   ALKPHOS 90 78  --  60  --   --   --   --   BILITOT 0.8 1.2  --  1.6*  --   --   --   --   ALBUMIN 2.8* 2.5*  < > 2.2*  < > 2.2* 2.2* 2.1*  < > = values in this interval not displayed.  Cardiac Enzymes No results for input(s): TROPONINI, PROBNP in the last 168 hours.  Glucose  Recent Labs Lab 03/11/16 0105 03/11/16 0154 03/11/16 0303 03/11/16 0406 03/11/16 0455 03/11/16 0558  GLUCAP 500* 446* 427* 423* 361* 310*    Imaging Dg Chest Port 1 View  Result Date: 03/10/2016 CLINICAL DATA:  Status post central line placement EXAM: PORTABLE CHEST 1 VIEW COMPARISON:  Portable chest x-ray of today's date FINDINGS: A large caliber right internal jugular venous catheter is  been placed with the tip projecting over the midportion of the SVC. No postprocedure pneumothorax is observed. The pulmonary interstitial markings remain increased diffusely greatest on the right with areas of confluence. The cardiac silhouette is obscured. The pulmonary vascularity is indistinct. There is dense calcification in the wall of the aortic arch. The endotracheal tube tip lies 4.9 cm above the carina. The esophagogastric tube tip projects below the inferior margin of the image. The permanent pacemaker is in stable position. IMPRESSION: No postprocedure complication following placement of the right internal jugular venous catheter. Persistent diffuse interstitial and alveolar infiltrates consistent with pneumonia or edema. Electronically Signed   By: David  [place] M.D.   On: 03/10/2016 17:32   Dg Chest Port 1 View  Result Date: 03/10/2016 CLINICAL DATA:  Hypoxia EXAM: PORTABLE CHEST 1 VIEW COMPARISON:  March 09, 2016 FINDINGS: Endotracheal tube tip is 2.0 cm above the carina. Nasogastric tube tip and side port are below the diaphragm. Pacemaker leads are attached the right atrium and right ventricle. No pneumothorax. There is widespread interstitial and alveolar edema. Consolidation is greatest throughout the right upper lobe. There is mild cardiomegaly with pulmonary venous hypertension. There are questionable small pleural effusions bilaterally. There is atherosclerotic calcification in the aorta. No adenopathy demonstrable. IMPRESSION: Tube positions as described without pneumothorax. Widespread interstitial and alveolar edema consistent with congestive heart failure. Cannot exclude superimposed pneumonia right upper lobe. The appearance is stable in this area. Stable cardiac silhouette.  There is aortic atherosclerosis. Electronically Signed   By: Bretta Bang III M.D.   On: 03/10/2016 10:31     STUDIES:  2D echo 1/21>>>poor images, ef 55%, Pa 33  CULTURES: 1/18 bc>> 1/21 PCR flu  pos  ANTIBIOTICS: 1/18 vanc>>1/23 1/18 Zoysn>> 1/21 tamiflu>>> 1/21 azithro>>>1/22  SIGNIFICANT EVENTS: 1/22 worsening clicnial status, needs itnuabtion  LINES/TUBES: 12/11 rt av graft>>  DISCUSSION: 71 yo obese(243lbs) former smoker(quit 8 years ago)with known LBB(PPM) PAF(on BB and coumadin) and CRF(base cret 3.5) who was admitted 1/18 with CC of SOB. He was admitted 12/17 and required HD vvia temp cath that was removed recently. His CxR shows diffuse edema and persistent rul infiltrate cw pna. PCCM was called 1/19 for AMS and resp failure requiring NIMVS. He was awake and alert, aggressive diuresis had been instituted and renal called. He does not need intubation at this time(may in future) and may need another  HD cath as his rt av graft is not mature. We will leave him in SDU but if worsens we will move to ICU.  ASSESSMENT / PLAN:   Acute respiratory failure with hypoxia flu , ARDS R/o component edema Suspect OSA  COPD  ETT 1/22>> Assume flu pneumonitis P:   ARDS protocol to keep plat less than 30 Maintain neg balance Maintain lasix Stop metolazone with rising Cr pcxr in am  Continue Solumedrol  Chronic renal failure with recent HD cath and HD 12/17 HD recently removed Immature AV graft P:   May need HD with worsening Cr, K stable now Continue Lasix Stop Metolazone with rising Cr, UOP 450 Maintain neg balance  Follow UOP bmet in AM  NPO Tube feeds  PNA - Keep Vanc/Zosyn - Keep tamiflu dose adjusted - pcxr in am  Cardiac -h/o fib -heparin gtt once INR less than 2 - Continue metoprolol    FAMILY  - Updates: daughter updated at bedside 1/21, updated wife  - Inter-disciplinary family meet or Palliative Care meeting due by:  day 7  Rich Numberarly Rivet, MD, MPH Internal Medicine Resident, PGY-III Pager: [phone]  STAFF NOTE: Cindi CarbonI, Corday Wyka, MD FACP have personally reviewed patient's available data, including medical history, events of note, physical  examination and test results as part of my evaluation. I have discussed with resident/NP and other care providers such as pharmacist, RN and RRT. In addition, I personally evaluated patient and elicited key findings of: sedated rass -3, coarse bs, some mild blood tinged secretions, edema increased, pcxr with bilateral infiltrates, plat higher, re order ards protocol and low TV ventilation, abg to follow, may need 6 cc/kg, pcxr in am, agree need cvvhd attempts at volume removal, maintain rass -2 to -3 , may need additional agents, culture neg, pct low, dc vanc, narrow to ceftraixone, continued tamiflu, feeding to goal, I updated duaghter and wife in room The patient is critically ill with multiple organ systems failure and requires high complexity decision making for assessment and support, frequent evaluation and titration of therapies, application of advanced monitoring technologies and extensive interpretation of multiple databases.   Critical Care Time devoted to patient care services described in this note is 30 Minutes. This time reflects time of care of this signee: Rory Percyaniel Jullianna Gabor, MD FACP. This critical care time does not reflect procedure time, or teaching time or supervisory time of PA/NP/Med student/Med Resident etc but could involve care discussion time. Rest per NP/medical resident whose note is outlined above and that I agree with   Mcarthur Rossettianiel J. Tyson AliasFeinstein, MD, FACP Pgr: [phone] [facility] 03/11/2016 9:55 AM   I have had extensive discussions with family wife and dauighters. We discussed patients current circumstances and organ failures. We also discussed patient's prior wishes under circumstances such as this. Family has decided to NOT perform resuscitation if arrest but to continue current medical support for now.

## 2016-03-12 ENCOUNTER — Inpatient Hospital Stay (HOSPITAL_COMMUNITY): Payer: Non-veteran care

## 2016-03-12 ENCOUNTER — Ambulatory Visit: Payer: Self-pay | Admitting: Physician Assistant

## 2016-03-12 DIAGNOSIS — N186 End stage renal disease: Secondary | ICD-10-CM

## 2016-03-12 LAB — RENAL FUNCTION PANEL
ALBUMIN: 2.3 g/dL — AB (ref 3.5–5.0)
ANION GAP: 15 (ref 5–15)
Albumin: 2.4 g/dL — ABNORMAL LOW (ref 3.5–5.0)
Anion gap: 17 — ABNORMAL HIGH (ref 5–15)
BUN: 67 mg/dL — ABNORMAL HIGH (ref 6–20)
BUN: 49 mg/dL — AB (ref 6–20)
CALCIUM: 9.4 mg/dL (ref 8.9–10.3)
CHLORIDE: 99 mmol/L — AB (ref 101–111)
CHLORIDE: 99 mmol/L — AB (ref 101–111)
CO2: 20 mmol/L — AB (ref 22–32)
CO2: 21 mmol/L — ABNORMAL LOW (ref 22–32)
CREATININE: 2.86 mg/dL — AB (ref 0.61–1.24)
Calcium: 9.4 mg/dL (ref 8.9–10.3)
Creatinine, Ser: 2.25 mg/dL — ABNORMAL HIGH (ref 0.61–1.24)
GFR calc Af Amer: 32 mL/min — ABNORMAL LOW (ref >60)
GFR calc non Af Amer: 21 mL/min — ABNORMAL LOW (ref >60)
GFR calc non Af Amer: 28 mL/min — ABNORMAL LOW (ref >60)
GFR, EST AFRICAN AMERICAN: 24 mL/min — AB (ref >60)
GLUCOSE: 133 mg/dL — AB (ref 65–99)
Glucose, Bld: 149 mg/dL — ABNORMAL HIGH (ref 65–99)
PHOSPHORUS: 3.7 mg/dL (ref 2.5–4.6)
POTASSIUM: 4.5 mmol/L (ref 3.5–5.1)
Phosphorus: 3.1 mg/dL (ref 2.5–4.6)
Potassium: 3.2 mmol/L — ABNORMAL LOW (ref 3.5–5.1)
Sodium: 136 mmol/L (ref 135–145)
Sodium: 135 mmol/L (ref 135–145)

## 2016-03-12 LAB — CULTURE, RESPIRATORY W GRAM STAIN

## 2016-03-12 LAB — GLUCOSE, CAPILLARY
GLUCOSE-CAPILLARY: 216 mg/dL — AB (ref 65–99)
GLUCOSE-CAPILLARY: 186 mg/dL — AB (ref 65–99)
GLUCOSE-CAPILLARY: 196 mg/dL — AB (ref 65–99)
GLUCOSE-CAPILLARY: 147 mg/dL — AB (ref 65–99)
GLUCOSE-CAPILLARY: 130 mg/dL — AB (ref 65–99)
GLUCOSE-CAPILLARY: 132 mg/dL — AB (ref 65–99)
GLUCOSE-CAPILLARY: 170 mg/dL — AB (ref 65–99)
GLUCOSE-CAPILLARY: 153 mg/dL — AB (ref 65–99)
GLUCOSE-CAPILLARY: 148 mg/dL — AB (ref 65–99)
Glucose-Capillary: 126 mg/dL — ABNORMAL HIGH (ref 65–99)
Glucose-Capillary: 138 mg/dL — ABNORMAL HIGH (ref 65–99)
Glucose-Capillary: 141 mg/dL — ABNORMAL HIGH (ref 65–99)
Glucose-Capillary: 144 mg/dL — ABNORMAL HIGH (ref 65–99)
Glucose-Capillary: 150 mg/dL — ABNORMAL HIGH (ref 65–99)
Glucose-Capillary: 152 mg/dL — ABNORMAL HIGH (ref 65–99)
Glucose-Capillary: 156 mg/dL — ABNORMAL HIGH (ref 65–99)
Glucose-Capillary: 158 mg/dL — ABNORMAL HIGH (ref 65–99)
Glucose-Capillary: 161 mg/dL — ABNORMAL HIGH (ref 65–99)
Glucose-Capillary: 194 mg/dL — ABNORMAL HIGH (ref 65–99)

## 2016-03-12 LAB — POCT I-STAT 3, ART BLOOD GAS (G3+)
Acid-base deficit: 7 mmol/L — ABNORMAL HIGH (ref 0.0–2.0)
Bicarbonate: 19 mmol/L — ABNORMAL LOW (ref 20.0–28.0)
O2 Saturation: 99 %
TCO2: 20 mmol/L (ref 0–100)
pCO2 arterial: 35.8 mmHg (ref 32.0–48.0)
pH, Arterial: 7.329 — ABNORMAL LOW (ref 7.350–7.450)
pO2, Arterial: 123 mmHg — ABNORMAL HIGH (ref 83.0–108.0)

## 2016-03-12 LAB — CULTURE, BLOOD (ROUTINE X 2)
CULTURE: NO GROWTH
Culture: NO GROWTH

## 2016-03-12 LAB — CBC
HCT: 30.6 % — ABNORMAL LOW (ref 39.0–52.0)
Hemoglobin: 9 g/dL — ABNORMAL LOW (ref 13.0–17.0)
MCH: 28 pg (ref 26.0–34.0)
MCHC: 29.4 g/dL — AB (ref 30.0–36.0)
MCV: 95 fL (ref 78.0–100.0)
PLATELETS: 354 10*3/uL (ref 150–400)
RBC: 3.22 MIL/uL — ABNORMAL LOW (ref 4.22–5.81)
RDW: 18.4 % — AB (ref 11.5–15.5)
WBC: 21 10*3/uL — AB (ref 4.0–10.5)

## 2016-03-12 LAB — GLOMERULAR BASEMENT MEMBRANE ANTIBODIES: GBM AB: 9 U (ref 0–20)

## 2016-03-12 LAB — MAGNESIUM: MAGNESIUM: 2.5 mg/dL — AB (ref 1.7–2.4)

## 2016-03-12 LAB — PROTIME-INR
INR: 2.17
PROTHROMBIN TIME: 24.6 s — AB (ref 11.4–15.2)

## 2016-03-12 LAB — CULTURE, RESPIRATORY

## 2016-03-12 MED ORDER — DARBEPOETIN ALFA 100 MCG/0.5ML IJ SOSY
100.0000 ug | PREFILLED_SYRINGE | INTRAMUSCULAR | Status: DC
  Filled 2016-03-12: qty 0.5

## 2016-03-12 MED ORDER — INSULIN ASPART 100 UNIT/ML ~~LOC~~ SOLN
2.0000 [IU] | SUBCUTANEOUS | Status: DC
  Administered 2016-03-12 (×2): 2 [IU] via SUBCUTANEOUS
  Administered 2016-03-13 (×5): 4 [IU] via SUBCUTANEOUS
  Administered 2016-03-13: 2 [IU] via SUBCUTANEOUS
  Administered 2016-03-14 (×2): 6 [IU] via SUBCUTANEOUS

## 2016-03-12 MED ORDER — INSULIN GLARGINE 100 UNIT/ML ~~LOC~~ SOLN
30.0000 [IU] | SUBCUTANEOUS | Status: DC
  Administered 2016-03-12 – 2016-03-13 (×2): 30 [IU] via SUBCUTANEOUS
  Filled 2016-03-12 (×3): qty 0.3

## 2016-03-12 MED ORDER — SODIUM CHLORIDE 0.9 % IV SOLN
3.0000 ug/min | INTRAVENOUS | Status: DC
  Administered 2016-03-12: 80 ug/min via INTRAVENOUS
  Administered 2016-03-13 (×2): 100 ug/min via INTRAVENOUS
  Administered 2016-03-14: 60 ug/min via INTRAVENOUS
  Filled 2016-03-12 (×6): qty 4

## 2016-03-12 MED ORDER — INSULIN ASPART 100 UNIT/ML ~~LOC~~ SOLN
6.0000 [IU] | SUBCUTANEOUS | Status: DC
  Administered 2016-03-12 – 2016-03-14 (×10): 6 [IU] via SUBCUTANEOUS

## 2016-03-12 MED ORDER — DEXTROSE 10 % IV SOLN
INTRAVENOUS | Status: DC | PRN
Start: 2016-03-12 — End: 2016-03-14

## 2016-03-12 MED ORDER — POTASSIUM CHLORIDE 20 MEQ PO PACK
40.0000 meq | PACK | Freq: Two times a day (BID) | ORAL | Status: AC
  Administered 2016-03-12 (×2): 40 meq
  Filled 2016-03-12 (×2): qty 2

## 2016-03-12 MED ORDER — POTASSIUM CHLORIDE 20 MEQ PO PACK
40.0000 meq | PACK | Freq: Once | ORAL | Status: AC
Start: 2016-03-12 — End: 2016-03-12
  Administered 2016-03-12: 40 meq
  Filled 2016-03-12: qty 2

## 2016-03-12 MED ORDER — SODIUM CHLORIDE 0.9 % IV SOLN
3.0000 ug/min | INTRAVENOUS | Status: DC
  Administered 2016-03-12: 20 ug/min via INTRAVENOUS
  Filled 2016-03-12: qty 1

## 2016-03-12 MED ORDER — SODIUM CHLORIDE 0.9 % IV SOLN
1.0000 mg/h | INTRAVENOUS | Status: DC
Start: 2016-03-12 — End: 2016-03-15
  Administered 2016-03-12: 2 mg/h via INTRAVENOUS
  Administered 2016-03-14: 1 mg/h via INTRAVENOUS
  Filled 2016-03-12 (×2): qty 20

## 2016-03-12 MED ORDER — PHENYLEPHRINE HCL 10 MG/ML IJ SOLN
30.0000 ug/min | INTRAVENOUS | Status: DC
  Filled 2016-03-12: qty 1

## 2016-03-12 NOTE — Progress Notes (Signed)
eLink Physician-Brief Progress Note Patient Name: Zachary CleverlyMichael D [name] DOB: Feb 15, 1946 MRN: [id number]   Date of Service  03/12/2016  HPI/Events of Note  Pt with ARDS, septic shock, influenza, AKI. On CRRT  BP has been 70-90 systolic. On levophed at 40 mcg/kg/min  No fluid being removed by CRRT.   eICU Interventions  Will add neosynpehrine     Intervention Category Major Interventions: Hypotension - evaluation and management  Daneen SchickJose Angelo A De Dios 03/12/2016, 8:14 PM

## 2016-03-12 NOTE — Progress Notes (Signed)
eLink Physician-Brief Progress Note Patient Name: Zadie CleverlyMichael D Sevey DOB: 11/22/1945 MRN: [id number]   Date of Service  03/12/2016  HPI/Events of Note  Patient is on pressors.   eICU Interventions  Request for arterial line.      Intervention Category Intermediate Interventions: Other:  Louann SjogrenJose Angelo A De Dios 03/12/2016, 9:34 PM

## 2016-03-12 NOTE — Procedures (Signed)
Arterial Catheter Insertion Procedure Note [name] [id number] Jun 29, 1945  Procedure: Insertion of Arterial Catheter  Indications: Blood pressure monitoring  Procedure Details Consent: Unable to obtain consent because of altered level of consciousness. Time Out: Verified patient identification, verified procedure, site/side was marked, verified correct patient position, special equipment/implants available, medications/allergies/relevent history reviewed, required imaging and test results available.  Performed  Maximum sterile technique was used including antiseptics, cap, gloves, gown, hand hygiene, mask and sheet. Skin prep: Chlorhexidine; local anesthetic administered 20 gauge catheter was inserted into left radial artery using the Seldinger technique.  Evaluation Blood flow good; BP tracing good. Complications: No apparent complications.   [name], [name] 03/12/2016

## 2016-03-12 NOTE — Progress Notes (Signed)
*  PRELIMINARY RESULTS* Vascular Ultrasound Duplex Dialysis Access (AVF, AGV) has been completed.  Preliminary findings:  Right AVF is patent.  Hypoechoic fluid collection seen anterior to the anastomosis measuring 2.2cm.  Area of narrowing in the outflow vein noted in mid upper arm with doubling of velocity.    [name] FischerCharlotte C Angelica Frandsen 03/12/2016, 3:52 PM

## 2016-03-12 NOTE — Progress Notes (Signed)
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 He was admitted 12/17 and required HD vvia temp cath that was removed recently. His CxR shows diffuse edema and persistent rul infiltrate cw pna. PCCM was called 1/19 for AMS and resp failure requiring NIMVS. He was awake and alert, aggressive diuresis had been instituted and renal called. He does not need intubation at this time (may in future) and may need another HD cath as his rt av graft is not mature. We will leave him in SDU but if worsens we will move to ICU.   SUBJECTIVE:  Asynchronous with vent overnight, propofol added. CRRT started yesterday.   VITAL SIGNS: BP (!) 104/45   Pulse (!) 120   Temp 97 F (36.1 C) (Axillary)   Resp (!) 32   Ht 5\' 11"  (1.803 m)   Wt 251 lb 5.2 oz (114 kg)   SpO2 100%   BMI 35.05 kg/m   HEMODYNAMICS: CVP:  [15 mmHg-29 mmHg] 15 mmHg  VENTILATOR SETTINGS: Vent Mode: PRVC FiO2 (%):  [40 %-50 %] 40 % Set Rate:  [26 bmp-32 bmp] 32 bmp Vt Set:  [450 mL-520 mL] 450 mL PEEP:  [5 cmH20] 5 cmH20 Plateau Pressure:  [20 cmH20-33 cmH20] 21 cmH20  INTAKE / OUTPUT: I/O last 3 completed shifts: In: 3409.2 [I.V.:1144.6; Blood:552; NG/GT:1414.7; IV Piggyback:298] Out: 1054 [Urine:481; Other:573]  PHYSICAL EXAMINATION: General:  Ill appearing man sedated and intubated  Neuro:  Sedated on vent. Responds to painful stimuli HEENT: [place]/AT, sclera anicteric, ETT in place Cardiovascular:  RRR, no m/g/r Lungs: coarse BS bilaterally Abdomen: BS+, soft, obese Musculoskeletal: warm and dry, trace bilateral LE edema  LABS:  BMET  Recent Labs Lab 03/11/16 0446 03/11/16 1530  03/12/16 0500  NA 134* 137 136  K 3.5 4.0 3.2*  CL 94* 98* 99*  CO2 23 25 20*  BUN 123* 114* 67*  CREATININE 5.14* 4.51* 2.86*  GLUCOSE 399* 143* 133*    Electrolytes  Recent Labs Lab 03/10/16 1527  03/11/16 0446 03/11/16 1530 03/12/16 0500  CALCIUM 8.9  < > 9.6 9.5 9.4  MG 2.2  --  2.4  --  2.5*  PHOS 5.9*  5.7*  --  5.8* 5.9* 3.1  < > = values in this interval not displayed.  CBC  Recent Labs Lab 03/10/16 0512 03/11/16 0446 03/12/16 0500  WBC 8.8 5.5 21.0*  HGB 8.3* 7.6* 9.0*  HCT 27.3* 25.6* 30.6*  PLT 176 186 354    Coag's  Recent Labs Lab 03/10/16 0512 03/11/16 0446 03/12/16 0500  INR 2.59 2.25 2.17    Sepsis Markers  Recent Labs Lab 03/06/16 1845 03/06/16 2232 03/07/16 0322 03/07/16 1232 03/09/16 0424 03/11/16 0446  LATICACIDVEN 1.6 2.4* 1.7  --   --   --   PROCALCITON  --   --   --  0.22 0.33 0.57    ABG  Recent Labs Lab 03/10/16 1015 03/11/16 1256 03/11/16 1842  PHART 7.343* 7.280* 7.321*  PCO2ART 43.3 49.8* 42.4  PO2ART 422* 59.0* 115*    Liver Enzymes  Recent Labs Lab 03/06/16 1236 03/07/16 0322  03/09/16 1118  03/11/16 0446 03/11/16  1530 03/12/16 0500  AST 40 32  --  19  --   --   --   --   ALT 63 50  --  31  --   --   --   --   ALKPHOS 90 78  --  60  --   --   --   --   BILITOT 0.8 1.2  --  1.6*  --   --   --   --   ALBUMIN 2.8* 2.5*  < > 2.2*  < > 2.1* 2.5* 2.4*  < > = values in this interval not displayed.  Cardiac Enzymes No results for input(s): TROPONINI, PROBNP in the last 168 hours.  Glucose  Recent Labs Lab 03/12/16 0104 03/12/16 0202 03/12/16 0319 03/12/16 0402 03/12/16 0502 03/12/16 0559  GLUCAP 186* 196* 161* 147* 138* 130*    Imaging No results found.   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 He was admitted 12/17 and required HD vvia temp cath that was removed recently. His CxR shows diffuse edema and persistent rul infiltrate cw pna. PCCM was called 1/19 for AMS and resp failure requiring NIMVS. He was awake and alert, aggressive diuresis had been instituted and renal called. He does not need intubation at this time(may in future) and may need another HD cath as his rt av graft is not mature. We will leave him in SDU but if worsens we will move to ICU.  ASSESSMENT / PLAN:   Acute respiratory failure with hypoxia flu , ARDS Likely some component pulmonary edema Suspect OSA  COPD  ETT 1/22>> Assume flu pneumonitis P:   ARDS protocol to keep plat less than 30 Maintain neg balance CRRT per renal, 2.3 L off pcxr- mildly better aeration Continue Solumedrol  Chronic renal failure with recent HD cath and HD 12/17 HD recently removed Immature AV graft Hypokalemia P:   CRRT per renal Maintain neg balance  Follow UOP bmet in AM Repleted K  GI Tube feeds  PNA - Vanc/Zosyn d/c 1/23 - On Ceftriaxone - Keep tamiflu dose adjusted  Cardiac -h/o fib -heparin gtt once INR less than 2 - Continue metoprolol  - On Levophed  DM, Hyperglycemia - insulin gtt - on TFs - Consider transitioning to Lantus/SSI   FAMILY  - Updates: daughter and wife updated at bedside 1/23  - Inter-disciplinary family meet or Palliative Care meeting due by:  day 7  Rich Number, MD, MPH Internal Medicine Resident, PGY-III Pager: [phone]  03/12/2016 6:20 AM     STAFF NOTE: Cindi Carbon, MD FACP have personally reviewed patient's available data, including medical history, events of note, physical examination and test results as part of my  evaluation. I have discussed with resident/NP and other care providers such as pharmacist, RN and RRT. In addition, I personally evaluated patient and elicited key findings of:  rass -3 on prop, fent, lungs improved aeration on exam, was neg balance, pcxr also improved aeration, edema remains but improved, he is showing signs of improvement, remains on levo 20, will need to dc prop, add versed with fent,  cvp noted, hope we can reduce levo with priop off and remove move volume on cvvhd, abg reviewed keep same MV, given shock and MV high, no SBT today planned, would want further neg balance and lower MV , abg to follow, feeding to goal, keep steroids, keep plat less 30 , maintain ceftriaxone empiric for now, tami flu, I updated family in room The patient is critically ill with multiple organ systems failure and requires high complexity decision making for assessment and support, frequent evaluation and titration of therapies, application of advanced monitoring technologies and extensive interpretation of multiple databases.   Critical Care Time devoted to patient care services described in this note is 30 Minutes. This time reflects time of care of this signee: Rory Percyaniel Londan Coplen, MD FACP. This critical care time does not reflect procedure time, or teaching time or supervisory time of PA/NP/Med student/Med Resident etc but could involve care discussion time. Rest per NP/medical resident whose note is outlined above and that I agree with   Mcarthur Rossettianiel J. Tyson AliasFeinstein, MD, FACP Pgr: [phone]  Pulmonary & Critical Care 03/12/2016 9:25 AM

## 2016-03-12 NOTE — Progress Notes (Addendum)
[name] Progress Note    Subjective:    HD cath placed 1/22 and CRRT started 1/23 Before it was even initiated yesterday developed profound hypotension Currently on Norepi at 30 No luck with reducing volume d/t BP's UOP now negligible/dirty brown    Objective:   BP (!) 105/45   Pulse 94   Temp 97.8 F (36.6 C) (Oral)   Resp (!) 32   Ht 5' 11"  (1.803 m)   Wt 114 kg (251 lb 5.2 oz)   SpO2 100%   BMI 35.05 kg/m   Intake/Output Summary (Last 24 hours) at 03/12/16 0805 Last data filed at 03/12/16 0700  Gross per 24 hour  Intake          2774.23 ml  Output             2533 ml  Net           241.23 ml   Weight Summary             03/12/16  114.0 kg    03/11/16  112.6 kg  CRRT initiated  03/10/16   111.2 kg     03/07/16   110.5 kg       Physical Exam: Intubated, sedated CVP 15 R IJ temp cath (1/22) L sided pacer in place Cannot see neck veins Sclerae not icteric Coarse breath sounds with crackles/rhonchi S1S2 No S3 or S4  Distant heart sounds, obscured by resp noise Abd obese, soft 1+ LE edema RUE upper arm AVF with + bruit Neuro - unable to assess.  Minimal dirty brown urine in foley bag  Imaging: Dg Chest Port 1 View  Result Date: 03/12/2016 CLINICAL DATA:  Flu, shortness of breath, intubated patient, dialysis dependent renal failure, CHF. EXAM: PORTABLE CHEST 1 VIEW COMPARISON:  Chest x-ray of March 11, 2016 FINDINGS: The lungs are adequately inflated. Diffuse airspace opacities persist but are slightly less conspicuous overall. There is a remains partial obscuration of the hemidiaphragms. The cardiac silhouette is enlarged. The pulmonary vascularity is indistinct. The endotracheal tube tip lies 3.7 cm above the carina. The esophagogastric tube tip projects below the inferior margin of the image. The right internal jugular venous catheter tip projects over the junction of the proximal and midportions of the SVC. The ICD is in stable position  where visualized. IMPRESSION: Persistent interstitial and alveolar opacities bilaterally consistent with pneumonia or pulmonary edema or a combination of the 2. Probable small bilateral pleural effusions. The support tubes are in reasonable position. Electronically Signed   By: David  Martinique M.D.   On: 03/12/2016 07:55   Dg Chest Port 1 View  Result Date: 03/11/2016 CLINICAL DATA:  Assess endotracheal tube EXAM: PORTABLE CHEST 1 VIEW COMPARISON:  03/10/2016 FINDINGS: The endotracheal tube has been advanced and is now 1.5 cm from the carina. Double lead left subclavian pacemaker device is stable and intact. Right jugular venous catheter stable. NG tube tip is beyond the gastroesophageal junction. Extensive bilateral airspace disease worse on the right is stable. Low volumes. Heart is obscured. No pneumothorax. IMPRESSION: Endotracheal tube advanced.  Tip is now a 1.5 cm from the carina. Stable diffuse airspace disease. Electronically Signed   By: Marybelle Killings M.D.   On: 03/11/2016 07:12   Dg Chest Port 1 View  Result Date: 03/10/2016 CLINICAL DATA:  Status post central line placement EXAM: PORTABLE CHEST 1 VIEW COMPARISON:  Portable chest x-ray of today's date FINDINGS: A large caliber right internal jugular venous catheter is  been placed with the tip projecting over the midportion of the SVC. No postprocedure pneumothorax is observed. The pulmonary interstitial markings remain increased diffusely greatest on the right with areas of confluence. The cardiac silhouette is obscured. The pulmonary vascularity is indistinct. There is dense calcification in the wall of the aortic arch. The endotracheal tube tip lies 4.9 cm above the carina. The esophagogastric tube tip projects below the inferior margin of the image. The permanent pacemaker is in stable position. IMPRESSION: No postprocedure complication following placement of the right internal jugular venous catheter. Persistent diffuse interstitial and alveolar  infiltrates consistent with pneumonia or edema. Electronically Signed   By: David  Martinique M.D.   On: 03/10/2016 17:32   Dg Chest Port 1 View  Result Date: 03/10/2016 CLINICAL DATA:  Hypoxia EXAM: PORTABLE CHEST 1 VIEW COMPARISON:  March 09, 2016 FINDINGS: Endotracheal tube tip is 2.0 cm above the carina. Nasogastric tube tip and side port are below the diaphragm. Pacemaker leads are attached the right atrium and right ventricle. No pneumothorax. There is widespread interstitial and alveolar edema. Consolidation is greatest throughout the right upper lobe. There is mild cardiomegaly with pulmonary venous hypertension. There are questionable small pleural effusions bilaterally. There is atherosclerotic calcification in the aorta. No adenopathy demonstrable. IMPRESSION: Tube positions as described without pneumothorax. Widespread interstitial and alveolar edema consistent with congestive heart failure. Cannot exclude superimposed pneumonia right upper lobe. The appearance is stable in this area. Stable cardiac silhouette.  There is aortic atherosclerosis. Electronically Signed   By: Lowella Grip III M.D.   On: 03/10/2016 10:31     Recent Labs Lab 03/09/16 1755 03/10/16 0512 03/10/16 0956 03/10/16 1527 03/11/16 0024 03/11/16 0446 03/11/16 1530 03/12/16 0500  NA 133* 135  --  135 132* 134* 137 136  K 3.6 3.0*  --  3.1* 4.1 3.5 4.0 3.2*  CL 95* 95*  --  96* 93* 94* 98* 99*  CO2 21* 20*  --  21* 21* 23 25 20*  GLUCOSE 264* 277*  --  346* 483* 399* 143* 133*  BUN 98* 104*  --  108* 115* 123* 114* 67*  CREATININE 3.96* 4.19*  --  4.43* 4.92* 5.14* 4.51* 2.86*  CALCIUM 9.0 9.1  --  8.9 9.3 9.6 9.5 9.4  PHOS 4.7* 5.3* 5.5* 5.9*  5.7*  --  5.8* 5.9* 3.1    Recent Labs Lab 03/06/16 1236  03/09/16 0424 03/10/16 0512 03/11/16 0446 03/12/16 0500  WBC 7.6  < > 7.9 8.8 5.5 21.0*  NEUTROABS 5.6  --   --   --   --   --   HGB 8.6*  < > 8.0* 8.3* 7.6* 9.0*  HCT 29.5*  < > 26.3* 27.3* 25.6*  30.6*  MCV 97.7  < > 92.3 92.9 94.5 95.0  PLT 181  < > 170 176 186 354  < > = values in this interval not displayed.  Results for Zachary, [name] (MRN [id number]) as of 03/11/2016 08:47  Ref. Range 03/09/2016 14:54  Influenza A By PCR Latest Ref Range: NEGATIVE  POSITIVE (A)   Lab Results  Component Value Date   INR 2.17 03/12/2016   INR 2.25 03/11/2016   INR 2.59 03/10/2016     Medications:    . cefTRIAXone (ROCEPHIN)  IV  2 g Intravenous Q24H  . chlorhexidine gluconate (MEDLINE KIT)  15 mL Mouth Rinse BID  . famotidine  20 mg Oral Daily  . feeding supplement (PRO-STAT SUGAR FREE 64)  30 mL Per Tube 5 X Daily  . feeding supplement (VITAL HIGH PROTEIN)  1,000 mL Per Tube Q24H  . ipratropium-albuterol  3 mL Nebulization Q6H  . levothyroxine  25 mcg Oral Once per day on Mon Tue Wed Thu Fri  . levothyroxine  50 mcg Oral Once per day on Sun Sat  . mouth rinse  15 mL Mouth Rinse 10 times per day  . methylPREDNISolone (SOLU-MEDROL) injection  40 mg Intravenous Q12H  . metoprolol tartrate  12.5 mg Per Tube BID  . oseltamivir  30 mg Oral QHS  . sertraline  100 mg Oral Daily   Infusion Meds: . fentaNYL infusion INTRAVENOUS 200 mcg/hr (03/12/16 0643)  . insulin (NOVOLIN-R) infusion 4.4 Units/hr (03/12/16 0700)  . norepinephrine (LEVOPHED) Adult infusion 30 mcg/min (03/12/16 0106)  . dialysis replacement fluid (prismasate) 500 mL/hr at 03/11/16 2322  . dialysis replacement fluid (prismasate) 300 mL/hr at 03/12/16 0643  . dialysate (PRISMASATE) 1,500 mL/hr at 03/12/16 0327  . propofol (DIPRIVAN) infusion 40 mcg/kg/min (03/12/16 8592)    Background:  71 y.o.year-old with history of DM, HTN, afib , diast CHF, HL and obesity, has CKD-->now being followed by Dr. Joelyn Oms (missed appt last week d/t hosp) Admitted with SOB and decomp CHF. (Admitted early December to [facility] for CHF exac and was refractory to high-dose diuretics.  Required dialysis X 5  for pulm edema but was able to leave off  dialysis with creatinine in mid 4's).  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 Came in on current occasion 1/18 as a transfer [facility] after developed viral URI type symptoms and became increasingly SOB, placed on BIPAP and received lasix 160. Renal was asked to see 1/19 for vol overload management in CKD. Subsequently required intubation, tested + for Flu A. CRRT initiated 03/11/16 for progressive AKI on CKD/pulm edema (temp cath placed 1/22)   Assessment/ Plan:    1. Acute resp failure with hypoxemia ->Flu pneumonitis/PNA/possible ARDS/pulmonary edema.  Tamiflu/ATB's per CCM. CRRT for volume removal. Weight up further, difficult to UF in face of hypotension. Pull fluid as able 2. PNA rocephin/dose adjusted Tamiflu 3. 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 Oliguric now. Suspect this will be the event that tips him over to ESRD. Has 1st stage of BVT AVF. Get duplex of AVF for maturation assessment. VVS involvement when more stable. CRRT started 1/23. Continue current CRRT all 4K fluids, no heparin (I assume since heparin will be started for AFib when INR <2 that we can use in circuit if need be), eventual UF goal neg 150-200/hour 5. Hypokalemia - CCM gave 40 VT this AM, repeat for 2 more doses today (total 120) 6. Hypophosphatemia - as expected falling with CRRT. Not at replacement level yet. 7. H/O AFib. INR still >2, plan is for heparin when <2  8. Anemia - Hb 8-9 range. Add Aranesp. No iron with sepsis.  Jamal Maes, MD [name] [phone] Pager 03/12/2016, 8:05 AM

## 2016-03-13 ENCOUNTER — Inpatient Hospital Stay (HOSPITAL_COMMUNITY): Payer: Non-veteran care

## 2016-03-13 ENCOUNTER — Ambulatory Visit: Payer: Medicare HMO | Admitting: Internal Medicine

## 2016-03-13 LAB — RENAL FUNCTION PANEL
ALBUMIN: 2.3 g/dL — AB (ref 3.5–5.0)
ANION GAP: 18 — AB (ref 5–15)
Albumin: 2.2 g/dL — ABNORMAL LOW (ref 3.5–5.0)
Anion gap: 17 — ABNORMAL HIGH (ref 5–15)
BUN: 39 mg/dL — AB (ref 6–20)
BUN: 33 mg/dL — ABNORMAL HIGH (ref 6–20)
CALCIUM: 9.4 mg/dL (ref 8.9–10.3)
CO2: 17 mmol/L — ABNORMAL LOW (ref 22–32)
CO2: 17 mmol/L — ABNORMAL LOW (ref 22–32)
Calcium: 9.4 mg/dL (ref 8.9–10.3)
Chloride: 99 mmol/L — ABNORMAL LOW (ref 101–111)
Chloride: 100 mmol/L — ABNORMAL LOW (ref 101–111)
Creatinine, Ser: 2.14 mg/dL — ABNORMAL HIGH (ref 0.61–1.24)
Creatinine, Ser: 2.09 mg/dL — ABNORMAL HIGH (ref 0.61–1.24)
GFR calc Af Amer: 34 mL/min — ABNORMAL LOW (ref >60)
GFR calc Af Amer: 35 mL/min — ABNORMAL LOW
GFR calc non Af Amer: 30 mL/min — ABNORMAL LOW
GFR, EST NON AFRICAN AMERICAN: 30 mL/min — AB (ref >60)
GLUCOSE: 201 mg/dL — AB (ref 65–99)
Glucose, Bld: 223 mg/dL — ABNORMAL HIGH (ref 65–99)
Phosphorus: 5.2 mg/dL — ABNORMAL HIGH (ref 2.5–4.6)
Phosphorus: 4.1 mg/dL (ref 2.5–4.6)
Potassium: 5.2 mmol/L — ABNORMAL HIGH (ref 3.5–5.1)
Potassium: 5.3 mmol/L — ABNORMAL HIGH (ref 3.5–5.1)
SODIUM: 134 mmol/L — AB (ref 135–145)
Sodium: 134 mmol/L — ABNORMAL LOW (ref 135–145)

## 2016-03-13 LAB — CBC
HCT: 32.9 % — ABNORMAL LOW (ref 39.0–52.0)
HEMOGLOBIN: 9.4 g/dL — AB (ref 13.0–17.0)
MCH: 29 pg (ref 26.0–34.0)
MCHC: 28.6 g/dL — AB (ref 30.0–36.0)
MCV: 101.5 fL — ABNORMAL HIGH (ref 78.0–100.0)
Platelets: 322 10*3/uL (ref 150–400)
RBC: 3.24 MIL/uL — ABNORMAL LOW (ref 4.22–5.81)
RDW: 19.2 % — ABNORMAL HIGH (ref 11.5–15.5)
WBC: 27.4 10*3/uL — AB (ref 4.0–10.5)

## 2016-03-13 LAB — POCT I-STAT 3, ART BLOOD GAS (G3+)
ACID-BASE DEFICIT: 9 mmol/L — AB (ref 0.0–2.0)
Acid-base deficit: 10 mmol/L — ABNORMAL HIGH (ref 0.0–2.0)
Acid-base deficit: 9 mmol/L — ABNORMAL HIGH (ref 0.0–2.0)
BICARBONATE: 17.4 mmol/L — AB (ref 20.0–28.0)
BICARBONATE: 18.3 mmol/L — AB (ref 20.0–28.0)
Bicarbonate: 17.6 mmol/L — ABNORMAL LOW (ref 20.0–28.0)
O2 Saturation: 97 %
O2 Saturation: 91 %
O2 Saturation: 93 %
PCO2 ART: 44 mmHg (ref 32.0–48.0)
PCO2 ART: 38.9 mmHg (ref 32.0–48.0)
PH ART: 7.206 — AB (ref 7.350–7.450)
PH ART: 7.256 — AB (ref 7.350–7.450)
PH ART: 7.239 — AB (ref 7.350–7.450)
PO2 ART: 111 mmHg — AB (ref 83.0–108.0)
PO2 ART: 67 mmHg — AB (ref 83.0–108.0)
Patient temperature: 97.4
Patient temperature: 97.4
TCO2: 19 mmol/L (ref 0–100)
TCO2: 19 mmol/L (ref 0–100)
TCO2: 20 mmol/L (ref 0–100)
pCO2 arterial: 42.6 mmHg (ref 32.0–48.0)
pO2, Arterial: 77 mmHg — ABNORMAL LOW (ref 83.0–108.0)

## 2016-03-13 LAB — GLUCOSE, CAPILLARY
GLUCOSE-CAPILLARY: 172 mg/dL — AB (ref 65–99)
Glucose-Capillary: 148 mg/dL — ABNORMAL HIGH (ref 65–99)
Glucose-Capillary: 160 mg/dL — ABNORMAL HIGH (ref 65–99)
Glucose-Capillary: 177 mg/dL — ABNORMAL HIGH (ref 65–99)
Glucose-Capillary: 187 mg/dL — ABNORMAL HIGH (ref 65–99)
Glucose-Capillary: 199 mg/dL — ABNORMAL HIGH (ref 65–99)

## 2016-03-13 LAB — MAGNESIUM: MAGNESIUM: 2.6 mg/dL — AB (ref 1.7–2.4)

## 2016-03-13 LAB — PROTIME-INR
INR: 3.33
PROTHROMBIN TIME: 34.5 s — AB (ref 11.4–15.2)

## 2016-03-13 MED ORDER — DEXTROSE 5 % IV SOLN
1.0000 mg | Freq: Once | INTRAVENOUS | Status: AC
  Administered 2016-03-13: 1 mg via INTRAVENOUS
  Filled 2016-03-13: qty 0.1

## 2016-03-13 MED ORDER — HYDROCORTISONE NA SUCCINATE PF 100 MG IJ SOLR
50.0000 mg | Freq: Four times a day (QID) | INTRAMUSCULAR | Status: DC
Start: 2016-03-13 — End: 2016-03-14
  Administered 2016-03-13 – 2016-03-14 (×6): 50 mg via INTRAVENOUS
  Filled 2016-03-13 (×6): qty 2

## 2016-03-13 MED ORDER — METHYLPREDNISOLONE SODIUM SUCC 40 MG IJ SOLR: 20.0000 mg | Freq: Two times a day (BID) | INTRAMUSCULAR | Status: DC

## 2016-03-13 MED ORDER — DARBEPOETIN ALFA 100 MCG/0.5ML IJ SOSY
100.0000 ug | PREFILLED_SYRINGE | INTRAMUSCULAR | Status: DC
  Administered 2016-03-13: 100 ug via SUBCUTANEOUS
  Filled 2016-03-13: qty 0.5

## 2016-03-13 MED ORDER — VASOPRESSIN 20 UNIT/ML IV SOLN
0.0300 [IU]/min | INTRAVENOUS | Status: DC
  Administered 2016-03-13 – 2016-03-14 (×2): 0.03 [IU]/min via INTRAVENOUS
  Filled 2016-03-13 (×2): qty 2

## 2016-03-13 NOTE — Progress Notes (Addendum)
PULMONARY / CRITICAL CARE MEDICINE   Name: [name] MRN: [id number] DOB: Jan 25, 1946    ADMISSION DATE:  03/06/2016 CONSULTATION DATE:  1/19  REFERRING MD:  [facility]  CHIEF COMPLAINT:  AMS  HISTORY OF PRESENT ILLNESS:   71 yo obese(243lbs) former smoker(quit 8 years ago)with known LBB(PPM) PAF(on BB and coumadin) and CRF(base cret 3.5) who was admitted 1/18 with CC of SOB. He was admitted 12/17 and required HD vvia temp cath that was removed recently. His CxR shows diffuse edema and persistent rul infiltrate cw pna. PCCM was called 1/19 for AMS and resp failure requiring NIMVS. He was awake and alert, aggressive diuresis had been instituted and renal called. He does not need intubation at this time (may in future) and may need another HD cath as his rt av graft is not mature. We will leave him in SDU but if worsens we will move to ICU.   SUBJECTIVE:  Asynchrony with vent noted by nursing. Neo added last night for BP support. Arterial line placed.   VITAL SIGNS: BP (!) 70/31 (BP Location: Right Leg)   Pulse 60   Temp 98.6 F (37 C) (Axillary)   Resp (!) 22   Ht 5\' 11"  (1.803 m)   Wt 248 lb 14.4 oz (112.9 kg)   SpO2 100%   BMI 34.71 kg/m   HEMODYNAMICS: CVP:  [14 mmHg-18 mmHg] 18 mmHg  VENTILATOR SETTINGS: Vent Mode: PRVC FiO2 (%):  [40 %] 40 % Set Rate:  [32 bmp-35 bmp] 32 bmp Vt Set:  [450 mL] 450 mL PEEP:  [5 cmH20] 5 cmH20 Plateau Pressure:  [21 cmH20-36 cmH20] 30 cmH20  INTAKE / OUTPUT: I/O last 3 completed shifts: In: 4159.2 [I.V.:2419.2; NG/GT:1690; IV Piggyback:50] Out: 3920 [Urine:40; Emesis/NG output:10; Other:3870]  PHYSICAL EXAMINATION: General:  Ill appearing man sedated and intubated  Neuro:  Sedated on vent. Responds to painful stimuli. Will not open eyes to voice. HEENT: [place]/AT, sclera anicteric, ETT in place Cardiovascular:  RRR, no m/g/r Lungs: coarse BS bilaterally Abdomen: BS+, soft, obese Musculoskeletal: warm and dry, trace bilateral LE  edema  LABS:  BMET  Recent Labs Lab 03/12/16 0500 03/12/16 1600 03/13/16 0443  NA 136 135 134*  K 3.2* 4.5 5.2*  CL 99* 99* 99*  CO2 20* 21* 17*  BUN 67* 49* 39*  CREATININE 2.86* 2.25* 2.14*  GLUCOSE 133* 149* 201*    Electrolytes  Recent Labs Lab 03/11/16 0446  03/12/16 0500 03/12/16 1600 03/13/16 0443  CALCIUM 9.6  < > 9.4 9.4 9.4  MG 2.4  --  2.5*  --  2.6*  PHOS 5.8*  < > 3.1 3.7 5.2*  < > = values in this interval not displayed.  CBC  Recent Labs Lab 03/11/16 0446 03/12/16 0500 03/13/16 0443  WBC 5.5 21.0* 27.4*  HGB 7.6* 9.0* 9.4*  HCT 25.6* 30.6* 32.9*  PLT 186 354 322    Coag's  Recent Labs Lab 03/11/16 0446 03/12/16 0500 03/13/16 0443  INR 2.25 2.17 3.33    Sepsis Markers  Recent Labs Lab 03/06/16 1845 03/06/16 2232 03/07/16 0322 03/07/16 1232 03/09/16 0424 03/11/16 0446  LATICACIDVEN 1.6 2.4* 1.7  --   --   --   PROCALCITON  --   --   --  0.22 0.33 0.57    ABG  Recent Labs Lab 03/11/16 1256 03/11/16 1842 03/12/16 1528  PHART 7.280* 7.321* 7.329*  PCO2ART 49.8* 42.4 35.8  PO2ART 59.0* 115* 123.0*    Liver Enzymes  Recent  Labs Lab 03/06/16 1236 03/07/16 0322  03/09/16 1118  03/12/16 0500 03/12/16 1600 03/13/16 0443  AST 40 32  --  19  --   --   --   --   ALT 63 50  --  31  --   --   --   --   ALKPHOS 90 78  --  60  --   --   --   --   BILITOT 0.8 1.2  --  1.6*  --   --   --   --   ALBUMIN 2.8* 2.5*  < > 2.2*  < > 2.4* 2.3* 2.3*  < > = values in this interval not displayed.  Cardiac Enzymes No results for input(s): TROPONINI, PROBNP in the last 168 hours.  Glucose  Recent Labs Lab 03/12/16 1412 03/12/16 1451 03/12/16 1555 03/12/16 2018 03/13/16 0028 03/13/16 0443  GLUCAP 153* 148* 141* 144* 160* 187*    Imaging No results found.   STUDIES:  2D echo 1/21>>>poor images, ef 55%, Pa 33  CULTURES: 1/18 bc>>no growth 1/21 PCR flu pos  ANTIBIOTICS: 1/18 vanc>>1/23 1/18 Zoysn>>1/23 1/21  tamiflu>>> 1/21 azithro>>>1/22 1/23 ceftriaxone>>  SIGNIFICANT EVENTS: 1/22 worsening clicnial status, needs itnuabtion  LINES/TUBES: 12/11 rt av graft>> ETT 1/22>> L radial art 1/24>>   DISCUSSION: 71 yo obese(243lbs) former smoker(quit 8 years ago)with known LBB(PPM) PAF(on BB and coumadin) and CRF(base cret 3.5) who was admitted 1/18 with CC of SOB. He was admitted 12/17 and required HD vvia temp cath that was removed recently. His CxR shows diffuse edema and persistent rul infiltrate cw pna. PCCM was called 1/19 for AMS and resp failure requiring NIMVS. He was awake and alert, aggressive diuresis had been instituted and renal called. He does not need intubation at this time(may in future) and may need another HD cath as his rt av graft is not mature. We will leave him in SDU but if worsens we will move to ICU.  ASSESSMENT / PLAN:   Acute respiratory failure with hypoxia flu , ARDS Likely some component pulmonary edema Suspect OSA  COPD  ETT 1/22>> Assume flu pneumonitis P:   ARDS protocol to keep plat less than 30 Maintain neg balance CRRT per renal, -3.2 L off over 24 hours Reduce Solumedrol Repeat ABG later this afternoon sbt attempt  Chronic renal failure with recent HD cath and HD 12/17 HD recently removed Immature AV graft Hypokalemia- resolved Hyperkalemia AG Metabolic acidosis  P:   CRRT per renal Maintain neg balance  Follow UOP bmet in AM  GI Tube feeds Assess BM , noted  ID- PNA - On Ceftriaxone - Keep tamiflu dose adjusted - WBC increasing, likely steroid induced   Cardiac -h/o fib, hypotension - heparin gtt once INR less than 2 - Holding metoprolol with hypotension - On Levophed and Neo  DM, Hyperglycemia - insulin gtt - on TFs - Consider transitioning to Lantus/SSI   FAMILY  - Updates: daughter and wife updated at bedside 1/24  - Inter-disciplinary family meet or Palliative Care meeting due by:  day 7  Rich Number, MD,  [facility] Internal Medicine Resident, PGY-III Pager: [phone]  03/13/2016 6:27 AM    STAFF NOTE: Cindi Carbon, MD FACP have personally reviewed patient's available data, including medical history, events of note, physical examination and test results as part of my evaluation. I have discussed with resident/NP and other care providers such as pharmacist, RN and RRT. In addition, I personally evaluated patient and elicited key  findings of: rass -1, not FC, coarse BS, no new pcxr, 1 edema, on low dose neo this am, cvp have been 17-18, ABG reviewed, keep same MV, with low TV ventilation can SBT attempt today, no role bicarb, pcxr needed in am for volume status, would like to avoid additional lines as we have cvp data from less 24 hours ago, change to stress roids, as we can reduce for pneumonitis and now in shock, coags noted admin vit k 1 mg, repeat coags in am, would continue fluid removal neg balance and follow neo needs, no hep drip until inr less 2.0, will update family The patient is critically ill with multiple organ systems failure and requires high complexity decision making for assessment and support, frequent evaluation and titration of therapies, application of advanced monitoring technologies and extensive interpretation of multiple databases.   Critical Care Time devoted to patient care services described in this note is Minutes. This time reflects time of care of this signee: Rory Percy, MD FACP. This critical care time does not reflect procedure time, or teaching time or supervisory time of PA/NP/Med student/Med Resident etc but could involve care discussion time. Rest per NP/medical resident whose note is outlined above and that I agree with   Mcarthur Rossetti. Tyson Alias, MD, FACP Pgr: [phone] [facility] 03/13/2016 7:44 AM '  Also now noted poor breath delivery, will have stat pcxr and abg and consider vent change and circuit

## 2016-03-13 NOTE — Progress Notes (Signed)
[facility] Progress Note    Subjective:    HD cath placed 1/22 and CRRT started 1/23 Shock prohibitive of optimal fluid removal Now on 2 pressors Essentially anuric at this time    Objective:   BP (!) 70/31 (BP Location: Right Leg)   Pulse 60   Temp 98.6 F (37 C) (Axillary)   Resp (!) 32   Ht 5' 11"  (1.803 m)   Wt 112.9 kg (248 lb 14.4 oz)   SpO2 100%   BMI 34.71 kg/m   Intake/Output Summary (Last 24 hours) at 03/13/16 0729 Last data filed at 03/13/16 0700  Gross per 24 hour  Intake          3046.35 ml  Output             3576 ml  Net          -529.65 ml   Weight Summary       03/13/16  112.9 kg    03/12/16  114.0 kg    03/11/16  112.6 kg  CRRT initiated  03/10/16   111.2 kg     03/07/16   110.5 kg       Physical Exam: Intubated, sedated R IJ temp cath (1/22) CVP 18 (yesterday ? Thru pigtail of HD cath (now being used for meds) L sided pacer in place Cannot see neck veins Sclerae not icteric Coarse breath sounds with crackles/rhonchi.  Very diminished at the bases S1S2 No S3 or S4  Distant heart sounds, obscured by resp noise Abd obese, soft 1+ LE edema RUE upper arm AVF with + bruit that fades upper arm Neuro - unable to assess 2/2 sedation Minimal dirty brown urine in foley bag  Imaging: Dg Chest Port 1 View  Result Date: 03/12/2016 CLINICAL DATA:  Flu, shortness of breath, intubated patient, dialysis dependent renal failure, CHF. EXAM: PORTABLE CHEST 1 VIEW COMPARISON:  Chest x-ray of March 11, 2016 FINDINGS: The lungs are adequately inflated. Diffuse airspace opacities persist but are slightly less conspicuous overall. There is a remains partial obscuration of the hemidiaphragms. The cardiac silhouette is enlarged. The pulmonary vascularity is indistinct. The endotracheal tube tip lies 3.7 cm above the carina. The esophagogastric tube tip projects below the inferior margin of the image. The right internal jugular venous catheter tip  projects over the junction of the proximal and midportions of the SVC. The ICD is in stable position where visualized. IMPRESSION: Persistent interstitial and alveolar opacities bilaterally consistent with pneumonia or pulmonary edema or a combination of the 2. Probable small bilateral pleural effusions. The support tubes are in reasonable position. Electronically Signed   By: David  Martinique M.D.   On: 03/12/2016 07:55     Recent Labs Lab 03/10/16 0956 03/10/16 1527 03/11/16 0024 03/11/16 0446 03/11/16 1530 03/12/16 0500 03/12/16 1600 03/13/16 0443  NA  --  135 132* 134* 137 136 135 134*  K  --  3.1* 4.1 3.5 4.0 3.2* 4.5 5.2*  CL  --  96* 93* 94* 98* 99* 99* 99*  CO2  --  21* 21* 23 25 20* 21* 17*  GLUCOSE  --  346* 483* 399* 143* 133* 149* 201*  BUN  --  108* 115* 123* 114* 67* 49* 39*  CREATININE  --  4.43* 4.92* 5.14* 4.51* 2.86* 2.25* 2.14*  CALCIUM  --  8.9 9.3 9.6 9.5 9.4 9.4 9.4  PHOS 5.5* 5.9*  5.7*  --  5.8* 5.9* 3.1 3.7 5.2*    Recent  Labs Lab 03/06/16 1236  03/10/16 0512 03/11/16 0446 03/12/16 0500 03/13/16 0443  WBC 7.6  < > 8.8 5.5 21.0* 27.4*  NEUTROABS 5.6  --   --   --   --   --   HGB 8.6*  < > 8.3* 7.6* 9.0* 9.4*  HCT 29.5*  < > 27.3* 25.6* 30.6* 32.9*  MCV 97.7  < > 92.9 94.5 95.0 101.5*  PLT 181  < > 176 186 354 322  < > = values in this interval not displayed.  Results for [name], [name] (MRN [id number]) as of 03/11/2016 08:47  Ref. Range 03/09/2016 14:54  Influenza A By PCR Latest Ref Range: NEGATIVE  POSITIVE (A)   Lab Results  Component Value Date   INR 3.33 03/13/2016   INR 2.17 03/12/2016   INR 2.25 03/11/2016   03/12/16 Vascular Ultrasound of AVF Duplex Dialysis Access (AVF, AGV) has been completed.  Preliminary findings:  Right AVF is patent.  Hypoechoic fluid collection seen anterior to the anastomosis measuring 2.2cm.  Area of narrowing in the outflow vein noted in mid upper arm with doubling of velocity.  Medications:    .  cefTRIAXone (ROCEPHIN)  IV  2 g Intravenous Q24H  . chlorhexidine gluconate (MEDLINE KIT)  15 mL Mouth Rinse BID  . darbepoetin (ARANESP) injection - NON-DIALYSIS  100 mcg Subcutaneous Q Wed-1800  . famotidine  20 mg Oral Daily  . feeding supplement (PRO-STAT SUGAR FREE 64)  30 mL Per Tube 5 X Daily  . feeding supplement (VITAL HIGH PROTEIN)  1,000 mL Per Tube Q24H  . insulin aspart  2-6 Units Subcutaneous Q4H  . insulin aspart  6 Units Subcutaneous Q4H  . insulin glargine  30 Units Subcutaneous Q24H  . ipratropium-albuterol  3 mL Nebulization Q6H  . levothyroxine  25 mcg Oral Once per day on Mon Tue Wed Thu Fri  . levothyroxine  50 mcg Oral Once per day on Sun Sat  . mouth rinse  15 mL Mouth Rinse 10 times per day  . methylPREDNISolone (SOLU-MEDROL) injection  40 mg Intravenous Q12H  . oseltamivir  30 mg Oral QHS  . sertraline  100 mg Oral Daily   Infusion Meds: . dextrose    . fentaNYL infusion INTRAVENOUS 50 mcg/hr (03/13/16 0624)  . midazolam (VERSED) infusion Stopped (03/13/16 0446)  . norepinephrine (LEVOPHED) Adult infusion 40 mcg/min (03/13/16 0211)  . phenylephrine (NEO-SYNEPHRINE) Adult infusion 90 mcg/min (03/13/16 0615)  . dialysis replacement fluid (prismasate) 500 mL/hr at 03/13/16 0514  . dialysis replacement fluid (prismasate) 300 mL/hr at 03/12/16 2351  . dialysate (PRISMASATE) 1,500 mL/hr at 03/13/16 0865    Background:  71 y.o.year-old with history of DM, HTN, afib , diast CHF, HL and obesity, has CKD-->now being followed by Dr. Joelyn Oms (missed appt last week d/t hosp) Admitted with SOB and decomp CHF. (Admitted early December to [facility] for CHF exac and was refractory to high-dose diuretics.  Required dialysis X 5  for pulm edema but was able to leave off dialysis with creatinine in mid 4's).  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 Came in on current occasion 71/18 [facility] a [facility] after developed viral URI type symptoms and became increasingly SOB, placed  on BIPAP and received lasix 160. Renal was asked to see 1/19 for vol overload management in CKD. Subsequently required intubation, tested + for Flu A/ dev ARDS. CRRT initiated 03/11/16 for progressive AKI on CKD/pulm edema (temp cath placed 1/22)  Assessment/ Plan:    1. VDRF w/hypoxemia -->Influenza A pneumonitis/PNA/ARDS/pulmonary edema.   1. Tamiflu/ATB's/vent mgmt per CCM.  2. CRRT for volume removal. Pull fluid as able 2. Volume overload/pulm edema. CRRT at most pulling net neg 100/hour d/t hypotension/high pressor requirement 1. Consider placement of line for CVP measurement 3. PNA rocephin/dose adjusted Tamiflu 4. 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 Oliguric now. Suspect this will be the event that tips him over to ESRD. 6. CRRT management - started 1/23.  Using all 4K fluids, no heparin (but I assume since heparin to be started for AFib when INR <2 that we can use in circuit if need be), eventual UF goal neg 150-200/hour . Phos and K rising (the latter because I probably replaced too aggressively 1/24...). 1. Increase DFR to 2L/hour 2. If K continues to rise will have to change to 0 K fluids  7. Dialysis access - Has 1st stage of BVT AVF.  1. Duplex suggests outflow vein issue that is likely impairing maturation 2. VVS involvement when more stable. (certainly not a priority right now) 8. H/O AFib. INR still >2, plan is for heparin when <2  9. Anemia - Hb 8-9 range. Added  Aranesp 100 QWed. No iron with sepsis.  Jamal Maes, MD [facility] [phone] Pager 03/13/2016, 7:29 AM

## 2016-03-14 ENCOUNTER — Inpatient Hospital Stay (HOSPITAL_COMMUNITY): Payer: Non-veteran care

## 2016-03-14 ENCOUNTER — Encounter: Payer: Medicare HMO | Admitting: Vascular Surgery

## 2016-03-14 DIAGNOSIS — J9621 Acute and chronic respiratory failure with hypoxia: Secondary | ICD-10-CM

## 2016-03-14 DIAGNOSIS — J80 Acute respiratory distress syndrome: Secondary | ICD-10-CM

## 2016-03-14 LAB — BLOOD GAS, ARTERIAL
ACID-BASE DEFICIT: 5.8 mmol/L — AB (ref 0.0–2.0)
Bicarbonate: 18.8 mmol/L — ABNORMAL LOW (ref 20.0–28.0)
DRAWN BY: 31101
FIO2: 30
O2 SAT: 96.5 %
PCO2 ART: 35.5 mmHg (ref 32.0–48.0)
PEEP/CPAP: 5 cmH2O
PH ART: 7.344 — AB (ref 7.350–7.450)
Patient temperature: 98.6
RATE: 28 resp/min
VT: 590 mL
pO2, Arterial: 94.6 mmHg (ref 83.0–108.0)

## 2016-03-14 LAB — RENAL FUNCTION PANEL
ALBUMIN: 2.3 g/dL — AB (ref 3.5–5.0)
Anion gap: 15 (ref 5–15)
BUN: 32 mg/dL — AB (ref 6–20)
CHLORIDE: 99 mmol/L — AB (ref 101–111)
CO2: 19 mmol/L — AB (ref 22–32)
Calcium: 9.6 mg/dL (ref 8.9–10.3)
Creatinine, Ser: 2.06 mg/dL — ABNORMAL HIGH (ref 0.61–1.24)
GFR calc Af Amer: 36 mL/min — ABNORMAL LOW (ref >60)
GFR calc non Af Amer: 31 mL/min — ABNORMAL LOW (ref >60)
GLUCOSE: 271 mg/dL — AB (ref 65–99)
POTASSIUM: 4.6 mmol/L (ref 3.5–5.1)
Phosphorus: 4.1 mg/dL (ref 2.5–4.6)
Sodium: 133 mmol/L — ABNORMAL LOW (ref 135–145)

## 2016-03-14 LAB — CBC WITH DIFFERENTIAL/PLATELET
BASOS PCT: 0 %
Basophils Absolute: 0 10*3/uL (ref 0.0–0.1)
Eosinophils Absolute: 0 10*3/uL (ref 0.0–0.7)
Eosinophils Relative: 0 %
HCT: 34.4 % — ABNORMAL LOW (ref 39.0–52.0)
HEMOGLOBIN: 9.5 g/dL — AB (ref 13.0–17.0)
LYMPHS PCT: 5 %
Lymphs Abs: 1.1 10*3/uL (ref 0.7–4.0)
MCH: 28.4 pg (ref 26.0–34.0)
MCHC: 27.6 g/dL — ABNORMAL LOW (ref 30.0–36.0)
MCV: 102.7 fL — ABNORMAL HIGH (ref 78.0–100.0)
MONOS PCT: 7 %
Monocytes Absolute: 1.5 10*3/uL — ABNORMAL HIGH (ref 0.1–1.0)
NEUTROS ABS: 18.7 10*3/uL — AB (ref 1.7–7.7)
Neutrophils Relative %: 88 %
Platelets: 249 10*3/uL (ref 150–400)
RBC: 3.35 MIL/uL — ABNORMAL LOW (ref 4.22–5.81)
RDW: 20.1 % — ABNORMAL HIGH (ref 11.5–15.5)
WBC: 21.3 10*3/uL — ABNORMAL HIGH (ref 4.0–10.5)

## 2016-03-14 LAB — GLUCOSE, CAPILLARY
GLUCOSE-CAPILLARY: 223 mg/dL — AB (ref 65–99)
Glucose-Capillary: 221 mg/dL — ABNORMAL HIGH (ref 65–99)
Glucose-Capillary: 310 mg/dL — ABNORMAL HIGH (ref 65–99)
Glucose-Capillary: 321 mg/dL — ABNORMAL HIGH (ref 65–99)

## 2016-03-14 LAB — PROTIME-INR
INR: 3.42
PROTHROMBIN TIME: 35.3 s — AB (ref 11.4–15.2)

## 2016-03-14 LAB — MAGNESIUM: Magnesium: 2.5 mg/dL — ABNORMAL HIGH (ref 1.7–2.4)

## 2016-03-14 MED ORDER — SODIUM CHLORIDE 0.9 % IV SOLN
10.0000 ug/h | INTRAVENOUS | Status: DC
  Administered 2016-03-14: 200 ug/h via INTRAVENOUS
  Filled 2016-03-14: qty 50

## 2016-03-14 MED ORDER — FENTANYL BOLUS VIA INFUSION
50.0000 ug | INTRAVENOUS | Status: DC | PRN
  Filled 2016-03-14: qty 200

## 2016-03-14 MED ORDER — VITAMIN K1 10 MG/ML IJ SOLN
1.0000 mg | Freq: Once | INTRAVENOUS | Status: AC
  Administered 2016-03-14: 1 mg via INTRAVENOUS
  Filled 2016-03-14: qty 0.1

## 2016-03-14 MED ORDER — INSULIN ASPART 100 UNIT/ML ~~LOC~~ SOLN
0.0000 [IU] | SUBCUTANEOUS | Status: DC
  Administered 2016-03-14 (×2): 15 [IU] via SUBCUTANEOUS

## 2016-03-14 MED ORDER — VITAL HIGH PROTEIN PO LIQD
1000.0000 mL | ORAL | Status: DC
  Filled 2016-03-14 (×2): qty 1000

## 2016-03-14 MED ORDER — PRO-STAT SUGAR FREE PO LIQD: 60.0000 mL | Freq: Three times a day (TID) | ORAL | Status: DC

## 2016-03-19 ENCOUNTER — Telehealth: Payer: Self-pay

## 2016-03-19 NOTE — Telephone Encounter (Signed)
On 03/19/2016 [facility] ([facility] The death certificate is for cremation. The patient is a patient of Doctor Ramaswamy. The death certificate will be taken to Pulmonary Unit @ [facility] this am for signature.  On 03/19/2016 I received the death certificate back from Doctor Ramaswamy.  I got the death certificate ready and faxed the death certificate to the funeral home per the funeral home request.

## 2016-03-20 ENCOUNTER — Telehealth: Payer: Self-pay

## 2016-03-20 NOTE — Progress Notes (Signed)
Nutrition Follow-up  DOCUMENTATION CODES:   Obesity unspecified  INTERVENTION:   Continue Vital High Protein @ 40 ml/hr (960 ml/day) Increase Prostat to 60 ml TID Provides: 1560 kcal, 174 grams protein, and 802 ml H2O.   NUTRITION DIAGNOSIS:   Inadequate oral intake related to inability to eat as evidenced by NPO status. Ongoing  GOAL:   Provide needs based on ASPEN/SCCM guidelines Met.   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 His CxR shows diffuse edema and persistent rul infiltrate cw pna, + flu.  VDRF - Influenza A pneumonitis/PNA/ARDS/pulmonary edema 3 pressors, maxed out on 2, A-line MAP 53-70 1/23 CRRT started CBG's: 223-321 - DM coordinator following left insulin recommendations Discussed nutrition plan with RN.   Diet Order:  Diet NPO time specified Except for: Sips with Meds  Skin:  Wound (see comment) (MASD groin, R knee/L buttocks skin tear)  Last BM:  1/241  Height:   Ht Readings from Last 1 Encounters:  03/11/16 _0  (1.803 m)    Weight:   Wt Readings from Last 1 Encounters:  Mar 26, 2016 238 lb 5.1 oz (108.1 kg)    Ideal Body Weight:  78.1 kg  BMI:  Body mass index is 33.24 kg/m.  Estimated Nutritional Needs:   Kcal:  8208-1388  Protein:  >/= 156 grams  Fluid:  > 1.5 L/day  EDUCATION NEEDS:   No education needs identified at this time  [place], [place], [place] Pager [phone] After Hours Pager

## 2016-03-20 NOTE — Progress Notes (Signed)
eLink Physician-Brief Progress Note Patient Name: [name] DOB: Sep 25, 1945 MRN: [id number]   Date of Service  12-Jun-2016  HPI/Events of Note  D/w wife & daughter They would like to proceed with withdrawal of life support - he would not want SNF/ prolonged life support  eICU Interventions  Order set palced Dc CRRT Then vent Then pressors     Intervention Category Major Interventions: End of life / care limitation discussion  Jaspreet Hollings V. 12-Jun-2016, 4:21 PM

## 2016-03-20 NOTE — Progress Notes (Signed)
Pt placed on C/PS per MD and pt did not tolerate well.  Pt was having prolonged periods of apena.

## 2016-03-20 NOTE — Progress Notes (Signed)
Inpatient Diabetes Program Recommendations  AACE/ADA: New Consensus Statement on Inpatient Glycemic Control (2015)  Target Ranges:  Prepandial:   less than 140 mg/dL      Peak postprandial:   less than 180 mg/dL (1-2 hours)      Critically ill patients:  140 - 180 mg/dL   Lab Results  Component Value Date   GLUCAP 310 (H) 2016-04-02   HGBA1C 6.0 (H) 03/06/2016    Review of Glycemic Control:  Results for Zachary CleverlyBELICZKY, Zachary D (MRN [id number]) as of February 15, 2017 14:57  Ref. Range 03/13/2016 20:32 February 15, 2017 00:38 February 15, 2017 04:11 February 15, 2017 08:27 February 15, 2017 12:07  Glucose-Capillary Latest Ref Range: 65 - 99 mg/dL 409172 (H) 811221 (H) 914223 (H) 321 (H) 310 (H)   Inpatient Diabetes Program Recommendations:    Note that ICU glycemic control order set d/c'd today and patient started on resistant correction q 4 hours.  May consider restarting Lantus 35 units daily and restarting Novolog tube feed coverage 7 units q 4 hours.  Discussed with RN.    Thanks, Beryl MeagerJenny Madicyn Mesina, RN, BC-ADM Inpatient Diabetes Coordinator Pager [phone] (8a-5p)

## 2016-03-20 NOTE — Telephone Encounter (Signed)
On 03/20/16 I received a [facility] (original[facility] The death certificate is for burial. The patient is a patient of Doctor Ramaswamy. The death certificate will be taken to Pulmonary Unit @ [facility] this pm for signature.   On 03/24/2016 I received the death certificate back from Doctor Ramaswamy. I got the death certificate ready and called the funeral home to let them know the death certificate is ready for pickup.

## 2016-03-20 NOTE — Progress Notes (Signed)
10mL of Versed drip wasted d/t drip was expired. Witnessed x2 RN ([address] [place], RN and Hoyle Sauerheresa O'leary RN)

## 2016-03-20 NOTE — Progress Notes (Signed)
Patient extubated with the withdrawal of life-sustaining protocol. Family in room. RN at bedside. Patient comfortable.

## 2016-03-20 NOTE — Progress Notes (Signed)
PULMONARY / CRITICAL CARE MEDICINE   Name: [name] MRN: [id number] DOB: 02-22-1945    ADMISSION DATE:  03/06/2016 CONSULTATION DATE:  1/19  REFERRING MD:  [facility]  CHIEF COMPLAINT:  AMS  HISTORY OF PRESENT ILLNESS:   71 yo obese(243lbs) former smoker(quit 8 years ago)with known LBB(PPM) PAF(on BB and coumadin) and CRF(base cret 3.5) who was admitted 1/18 with CC of SOB. He was admitted 12/17 and required HD vvia temp cath that was removed recently. His CxR shows diffuse edema and persistent rul infiltrate cw pna. PCCM was called 1/19 for AMS and resp failure requiring NIMVS. He was awake and alert, aggressive diuresis had been instituted and renal called. He does not need intubation at this time (may in future) and may need another HD cath as his rt av graft is not mature. We will leave him in SDU but if worsens we will move to ICU.   SUBJECTIVE:  Able to pull 6.1 L over last 24 hours but requiring 3 pressors. Becoming agitated/asynchronous on vent when sedation decreased.   VITAL SIGNS: BP (!) 149/43   Pulse 67   Temp 99.3 F (37.4 C) (Axillary)   Resp (!) 28   Ht _0  (1.803 m)   Wt 238 lb 5.1 oz (108.1 kg)   SpO2 97%   BMI 33.24 kg/m   HEMODYNAMICS:    VENTILATOR SETTINGS: Vent Mode: PRVC FiO2 (%):  [30 %-40 %] 30 % Set Rate:  [22 bmp-32 bmp] 28 bmp Vt Set:  [450 mL-590 mL] 590 mL PEEP:  [5 cmH20] 5 cmH20 Plateau Pressure:  [23 cmH20-29 cmH20] 24 cmH20  INTAKE / OUTPUT: I/O last 3 completed shifts: In: 4822.3 [I.V.:2897.3; NG/GT:1775; IV Piggyback:150] Out: 6086 [Urine:15; KKXFG:1829]  PHYSICAL EXAMINATION: General:  Ill appearing man sedated and intubated  Neuro:  Sedated on vent. Responds to painful stimuli. Will not open eyes to voice. HEENT: [place]/AT, sclera anicteric, ETT in place Cardiovascular:  RRR, no m/g/r Lungs: Coarse breath sounds, diminished at bases Abdomen: BS+, soft, obese Musculoskeletal: warm and dry, trace bilateral LE  edema  LABS:  BMET  Recent Labs Lab 03/13/16 0443 03/13/16 1600 2016/03/18 0405  NA 134* 134* 133*  K 5.2* 5.3* 4.6  CL 99* 100* 99*  CO2 17* 17* 19*  BUN 39* 33* 32*  CREATININE 2.14* 2.09* 2.06*  GLUCOSE 201* 223* 271*    Electrolytes  Recent Labs Lab 03/12/16 0500  03/13/16 0443 03/13/16 1600 March 18, 2016 0405  CALCIUM 9.4  < > 9.4 9.4 9.6  MG 2.5*  --  2.6*  --  2.5*  PHOS 3.1  < > 5.2* 4.1 4.1  < > = values in this interval not displayed.  CBC  Recent Labs Lab 03/12/16 0500 03/13/16 0443 03-18-2016 0405  WBC 21.0* 27.4* 21.3*  HGB 9.0* 9.4* 9.5*  HCT 30.6* 32.9* 34.4*  PLT 354 322 249    Coag's  Recent Labs Lab 03/12/16 0500 03/13/16 0443 Mar 18, 2016 0405  INR 2.17 3.33 3.42    Sepsis Markers  Recent Labs Lab 03/07/16 1232 03/09/16 0424 03/11/16 0446  PROCALCITON 0.22 0.33 0.57    ABG  Recent Labs Lab 03/13/16 0946 03/13/16 1430 03/18/2016 0240  PHART 7.256* 7.239* 7.344*  PCO2ART 38.9 42.6 35.5  PO2ART 67.0* 77.0* 94.6    Liver Enzymes  Recent Labs Lab 03/09/16 1118  03/13/16 0443 03/13/16 1600 2016/03/18 0405  AST 19  --   --   --   --   ALT 31  --   --   --   --  ALKPHOS 60  --   --   --   --   BILITOT 1.6*  --   --   --   --   ALBUMIN 2.2*  < > 2.3* 2.2* 2.3*  < > = values in this interval not displayed.  Cardiac Enzymes No results for input(s): TROPONINI, PROBNP in the last 168 hours.  Glucose  Recent Labs Lab 03/13/16 0755 03/13/16 1124 03/13/16 1613 03/13/16 2032 04/02/2016 0038 04-02-2016 0411  GLUCAP 148* 177* 199* 172* 221* 223*    Imaging Dg Chest Port 1 View  Result Date: 03/13/2016 CLINICAL DATA:  Adult respiratory distress syndrome, hypertension, CHF EXAM: PORTABLE CHEST 1 VIEW COMPARISON:  Chest x-ray of 03/12/2016 FINDINGS: The lungs are very poorly aerated with diffuse airspace disease which is little changed to slightly worsened. Considerations again of that of edema versus diffuse pneumonia. The tip  of the endotracheal tube is approximately 1.9 cm above the carina. Right IJ central venous line tip overlies the mid SVC. Pacer wires remain. IMPRESSION: 1. Very poor aeration of the lungs with little change to slight worsening of diffuse airspace disease, consistent with either edema or diffuse pneumonia. 2. Tip of endotracheal 1.9 cm above the carina. Electronically Signed   By: Ivar Drape M.D.   On: 03/13/2016 08:12     STUDIES:  2D echo 1/21>>>poor images, ef 55%, Pa 33  CULTURES: 1/18 bc>>no growth 1/21 PCR flu pos 1/22 BAL> rare candida tropicalis  ANTIBIOTICS: 1/18 vanc>>1/23 1/18 Zoysn>>1/23 1/21 tamiflu>>>1/25 1/21 azithro>>>1/22 1/23 ceftriaxone>>  SIGNIFICANT EVENTS: 2/97 worsening clicnial status, needs itnuabtion  LINES/TUBES: 12/11 rt av graft>> ETT 1/22>> L radial art 1/24>>   DISCUSSION: 70 yo obese(243lbs) former smoker(quit 8 years ago)with known LBB(PPM) PAF(on BB and coumadin) and CRF(base cret 3.5) who was admitted 1/18 with CC of SOB. He was admitted 12/17 and required HD vvia temp cath that was removed recently. His CxR shows diffuse edema and persistent rul infiltrate cw pna. PCCM was called 1/19 for AMS and resp failure requiring NIMVS. He was awake and alert, aggressive diuresis had been instituted and renal called. He does not need intubation at this time(may in future) and may need another HD cath as his rt av graft is not mature. We will leave him in SDU but if worsens we will move to ICU.  ASSESSMENT / PLAN:   Acute respiratory failure with hypoxia flu , ARDS Likely some component pulmonary edema Suspect OSA  COPD  ETT 1/22>> Assume flu pneumonitis P:   ARDS protocol to keep plat less than 30 Maintain neg balance CRRT per renal, -6.1 L off over 24 hours WUA/SBT attempt today Pcxr>> edema improving significantly   Chronic renal failure with recent HD cath and HD 12/17 HD recently removed Immature AV graft Hypokalemia-  resolved Hyperkalemia- resolved Metabolic acidosis  Anuric P:   CRRT per renal Maintain neg balance  Follow UOP bmet in AM  GI Tube feeds  ID- PNA - On Ceftriaxone - Tamiflu stopped 1/25 - WBC improved, 27>21  Cardiac - h/o fib, hypotension - heparin gtt once INR less than 2, but currently with coagulopathy - Holding metoprolol with hypotension - On Levophed, Neo, and Vaso- wean as able, may be difficult while pulling fluid  DM, Hyperglycemia - on TFs - Lantus/SSI  Coagulopathy- INR increased 1/25 from 2.1 to 3.3, now 3.4 despite vitamin K 1 mg x 1. In setting of shock. - Consider giving another dose vitamin K 1 mg    FAMILY  -  Updates: daughter and wife updated at bedside 1/25  - Inter-disciplinary family meet or Palliative Care meeting due by:  day 7  Albin Felling, MD, MPH Internal Medicine Resident, PGY-III Pager: [phone]  2016-03-26 6:41 AM

## 2016-03-20 NOTE — Progress Notes (Signed)
Pt with permanent pacemaker in place, no pulse noted on monitor from arterial line nor felt. Family at bedside. No heart nor lung sounds auscultated x1 minute by 2 RNs (Emer Meliaolleran, RN and Medco Health SolutionsHolly Georgina Krist, Charity fundraiserN). Attending MD notified. Emotional Support provided to family.

## 2016-03-20 NOTE — Progress Notes (Signed)
[facility] Progress Note    Subjective:    HD cath placed 1/22 and CRRT started 1/23 Shock prohibitive of optimal fluid removal Now on 3 pressors Not sure it weights correct - (112.9->108.1)    Objective:   BP (!) 149/43   Pulse 69   Temp 99.3 F (37.4 C) (Axillary)   Resp (!) 28   Ht 5' 11"  (1.803 m)   Wt 108.1 kg (238 lb 5.1 oz)   SpO2 98%   BMI 33.24 kg/m   Intake/Output Summary (Last 24 hours) at 03/29/16 0806 Last data filed at 2016/03/29 0709  Gross per 24 hour  Intake          3505.05 ml  Output             6052 ml  Net         -2546.95 ml   Weight Summary 03/29/16  108.1    03/13/16  112.9 kg    03/12/16  114.0 kg    03/11/16  112.6 kg  CRRT initiated  03/10/16   111.2 kg     03/07/16   110.5 kg       Physical Exam: Intubated, sedated R IJ temp cath (1/22) No CVP data L sided pacer in place Cannot see neck veins Sclerae not icteric Coarse breath sounds with crackles/rhonchi.  Very diminished at the bases S1S2 No S3 or S4  Distant heart sounds, obscured by resp noise Abd obese, soft 1+ LE edema RUE upper arm AVF with + bruit that fades upper arm Neuro - unable to assess 2/2 sedation  Imaging: Dg Chest Port 1 View  Result Date: 03/13/2016 CLINICAL DATA:  Adult respiratory distress syndrome, hypertension, CHF EXAM: PORTABLE CHEST 1 VIEW COMPARISON:  Chest x-ray of 03/12/2016 FINDINGS: The lungs are very poorly aerated with diffuse airspace disease which is little changed to slightly worsened. Considerations again of that of edema versus diffuse pneumonia. The tip of the endotracheal tube is approximately 1.9 cm above the carina. Right IJ central venous line tip overlies the mid SVC. Pacer wires remain. IMPRESSION: 1. Very poor aeration of the lungs with little change to slight worsening of diffuse airspace disease, consistent with either edema or diffuse pneumonia. 2. Tip of endotracheal 1.9 cm above the carina. Electronically Signed   By:  Ivar Drape M.D.   On: 03/13/2016 08:12     Recent Labs Lab 03/11/16 0446 03/11/16 1530 03/12/16 0500 03/12/16 1600 03/13/16 0443 03/13/16 1600 2016-03-29 0405  NA 134* 137 136 135 134* 134* 133*  K 3.5 4.0 3.2* 4.5 5.2* 5.3* 4.6  CL 94* 98* 99* 99* 99* 100* 99*  CO2 23 25 20* 21* 17* 17* 19*  GLUCOSE 399* 143* 133* 149* 201* 223* 271*  BUN 123* 114* 67* 49* 39* 33* 32*  CREATININE 5.14* 4.51* 2.86* 2.25* 2.14* 2.09* 2.06*  CALCIUM 9.6 9.5 9.4 9.4 9.4 9.4 9.6  PHOS 5.8* 5.9* 3.1 3.7 5.2* 4.1 4.1    Recent Labs Lab 03/11/16 0446 03/12/16 0500 03/13/16 0443 2016-03-29 0405  WBC 5.5 21.0* 27.4* 21.3*  NEUTROABS  --   --   --  18.7*  HGB 7.6* 9.0* 9.4* 9.5*  HCT 25.6* 30.6* 32.9* 34.4*  MCV 94.5 95.0 101.5* 102.7*  PLT 186 354 322 249    Results for GOTHAM, RADEN (MRN [id number]) as of 03/11/2016 08:47  Ref. Range 03/09/2016 14:54  Influenza A By PCR Latest Ref Range: NEGATIVE  POSITIVE (A)   Lab Results  Component Value Date   INR 3.42 04/06/16   INR 3.33 03/13/2016   INR 2.17 03/12/2016   03/12/16 Vascular Ultrasound of AVF Duplex Dialysis Access (AVF, AGV) has been completed.  Preliminary findings:  Right AVF is patent.  Hypoechoic fluid collection seen anterior to the anastomosis measuring 2.2cm.  Area of narrowing in the outflow vein noted in mid upper arm with doubling of velocity.  Medications:    . cefTRIAXone (ROCEPHIN)  IV  2 g Intravenous Q24H  . chlorhexidine gluconate (MEDLINE KIT)  15 mL Mouth Rinse BID  . darbepoetin (ARANESP) injection - NON-DIALYSIS  100 mcg Subcutaneous Q Thu-1800  . famotidine  20 mg Oral Daily  . feeding supplement (PRO-STAT SUGAR FREE 64)  30 mL Per Tube 5 X Daily  . feeding supplement (VITAL HIGH PROTEIN)  1,000 mL Per Tube Q24H  . hydrocortisone sod succinate (SOLU-CORTEF) inj  50 mg Intravenous Q6H  . insulin aspart  2-6 Units Subcutaneous Q4H  . insulin aspart  6 Units Subcutaneous Q4H  . insulin glargine  30 Units  Subcutaneous Q24H  . ipratropium-albuterol  3 mL Nebulization Q6H  . levothyroxine  25 mcg Oral Once per day on Mon Tue Wed Thu Fri  . levothyroxine  50 mcg Oral Once per day on Sun Sat  . mouth rinse  15 mL Mouth Rinse 10 times per day  . sertraline  100 mg Oral Daily   Infusion Meds: . dextrose    . fentaNYL infusion INTRAVENOUS 200 mcg/hr (2016/04/06 0404)  . midazolam (VERSED) infusion 1 mg/hr (03/13/16 2000)  . norepinephrine (LEVOPHED) Adult infusion 40 mcg/min (03/13/16 2000)  . phenylephrine (NEO-SYNEPHRINE) Adult infusion 50 mcg/min (2016-04-06 0144)  . dialysis replacement fluid (prismasate) 500 mL/hr at 04/06/16 0209  . dialysis replacement fluid (prismasate) 300 mL/hr at 03/13/16 1704  . dialysate (PRISMASATE) 2,000 mL/hr at 2016/04/06 0451  . vasopressin (PITRESSIN) infusion - *FOR SHOCK* 0.03 Units/min (03/13/16 2000)    Background:  71 y.o.year-old with history of DM, HTN, afib , diast CHF, HL and obesity, has CKD-->now being followed by Dr. Joelyn Oms (missed appt last week d/t hosp) Admitted with SOB and decomp CHF. (Admitted early December to [facility] for CHF exac and was refractory to high-dose diuretics.  Required dialysis X 5  for pulm edema but was able to leave off dialysis with creatinine in mid 4's).  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 Came in on current occasion 1/18 [facility] after developed viral URI type symptoms and became increasingly SOB, placed on BIPAP and received lasix 160. Renal was asked to see 1/19 for vol overload management in CKD. Subsequently required intubation, tested + for Flu A/ dev ARDS. CRRT initiated 03/11/16 for progressive AKI on CKD/pulm edema (temp cath placed 1/22)   Assessment/ Plan:    1. VDRF w/hypoxemia -->Influenza A pneumonitis/PNA/ARDS/pulmonary edema.   1. ATB's/vent mgmt per CCM. S/p Tamiflu  2. CRRT for volume removal. Pull fluid as able  2. Volume overload/pulm edema.  1. CRRT at most pulling net neg 100/hour  d/t hypotension/high pressor requirement but weight IS coming down 2. Would still consider placement of line for CVP measurement  3. Shock - NE/phenylephrine/vasopressin added yesterday  4. CKD IV/V -  1. Progressive AKI on CKD 4/5. Oliguric now.  2. Suspect this will be the event that tips him over to ESRD.  5. CRRT management - started 1/23.   1. Using all 4K fluids, no heparin (but I assume  since heparin to be started for AFib when INR <2 that we could use in circuit if need be), eventual UF goal neg 150-200/hour 2. DFR increased to 2L/hour yesterday with improvement in K  3. No change in CRRT prescription today  6. Dialysis access - temp IJ cath +  1st stage of BVT AVF.  1. Duplex suggests outflow vein issue that is likely impairing maturation 2. VVS involvement when more stable. (certainly not a priority right now) 3. Will need TDC at some point  7. H/O AFib. INR still >2, plan is for heparin when <2   8. Anemia - Hb 8-9 range. Added  Aranesp 100 QWed. No iron with sepsis.  Zachary Maes, MD [facility] [phone] Pager 04-07-16, 8:06 AM

## 2016-03-20 NOTE — Progress Notes (Signed)
45 mL of Versed gtt and 25mL of Fentanyl wasted in sink and witnessed x2 RNs (H. Kista Robb, RN and E. Vernell Leepolleran, RN)

## 2016-03-20 DEATH — deceased

## 2016-04-17 NOTE — Discharge Summary (Signed)
DISCHARGE SUMMARY    Date of admit: 03/06/2016 12:22 PM Date of discharge: Jun 20, 2016  9:49 PM Length of Stay: 8 days  PCP is Marlowe ShoresVAIDYA,RAKESHCHANDRA S, MD   PROBLEM LIST Principal Problem: Acute on chronic respiratory failure (HCC) due to  -  Acute on chronic systolic congestive heart failure (HCC)  -  ARDS (adult respiratory distress syndrome) (HCC) - Influenza A  - Septic shock    Active Problems:   IDDM (insulin dependent diabetes mellitus) (HCC)   Hypertensive heart disease   Hypothyroidism   Persistent atrial fibrillation (HCC)   CHF (congestive heart failure) (HCC)   Chronic anticoagulation      SUMMARY Zachary CleverlyMichael D Podolski was 71 y.o. 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   has a past surgical history that includes permanent pacemaker insertion (N/A, 08/03/2012); Cardioversion (N/A, 01/03/2016); AV fistula placement (Right, 01/28/2016); Insertion of dialysis catheter (Right, 01/28/2016); and Insert / replace / remove pacemaker.   Admitted on 03/06/2016 with70 yo obese(243lbs) former smoker(quit 8 years ago)with known LBB(PPM) PAF(on BB and coumadin) and CRF(base cret 3.5) who was admitted 1/18 with CC of SOB. He was  Originally  admitted 12/17 and required HD vvia temp cath that was removed recently. His CxR shows diffuse edema and persistent rul infiltrate cw pna. PCCM was called 1/19 for AMS and resp failure requiring NIMVS  EVENTS 03/08/16 - bipap dependent . Being  diuresed 03/09/16 -stll bipap dependent with xr with ALI/volume overload. Unable to do CT 03/09/16 - transfer to ICU and confirmed as Flu A positive PCR. ECHO Ef 55% 03/10/16 - intubated 03/11/16 - worsening ur output, remains intubated. CRRT started 03/12/16 - significant ven dysnchrony presnt 03/13/16 - needing pressors, vent dependent, on CRRT 06/06/2016 - refractory shock . DNR reconfirmed. Later in day passes away    SIGNED Dr. Kalman ShanMurali Kenyah Luba, M.D., [facility] Center[facility].C.P Pulmonary and Critical Care Medicine Staff Physician [facility] Pager: [phone], If no answer or between  15:00h - 7:00h: call [phone]  04/02/2016 1:28 PM

## 2018-12-03 IMAGING — CR DG CHEST 1V PORT
1 series · 1 of 1 positions shown · non-contrast
Comparison: Chest x-ray of 03/12/2016

CLINICAL DATA: Adult respiratory distress syndrome, hypertension,
CHF

EXAM:
PORTABLE CHEST 1 VIEW

[portable]
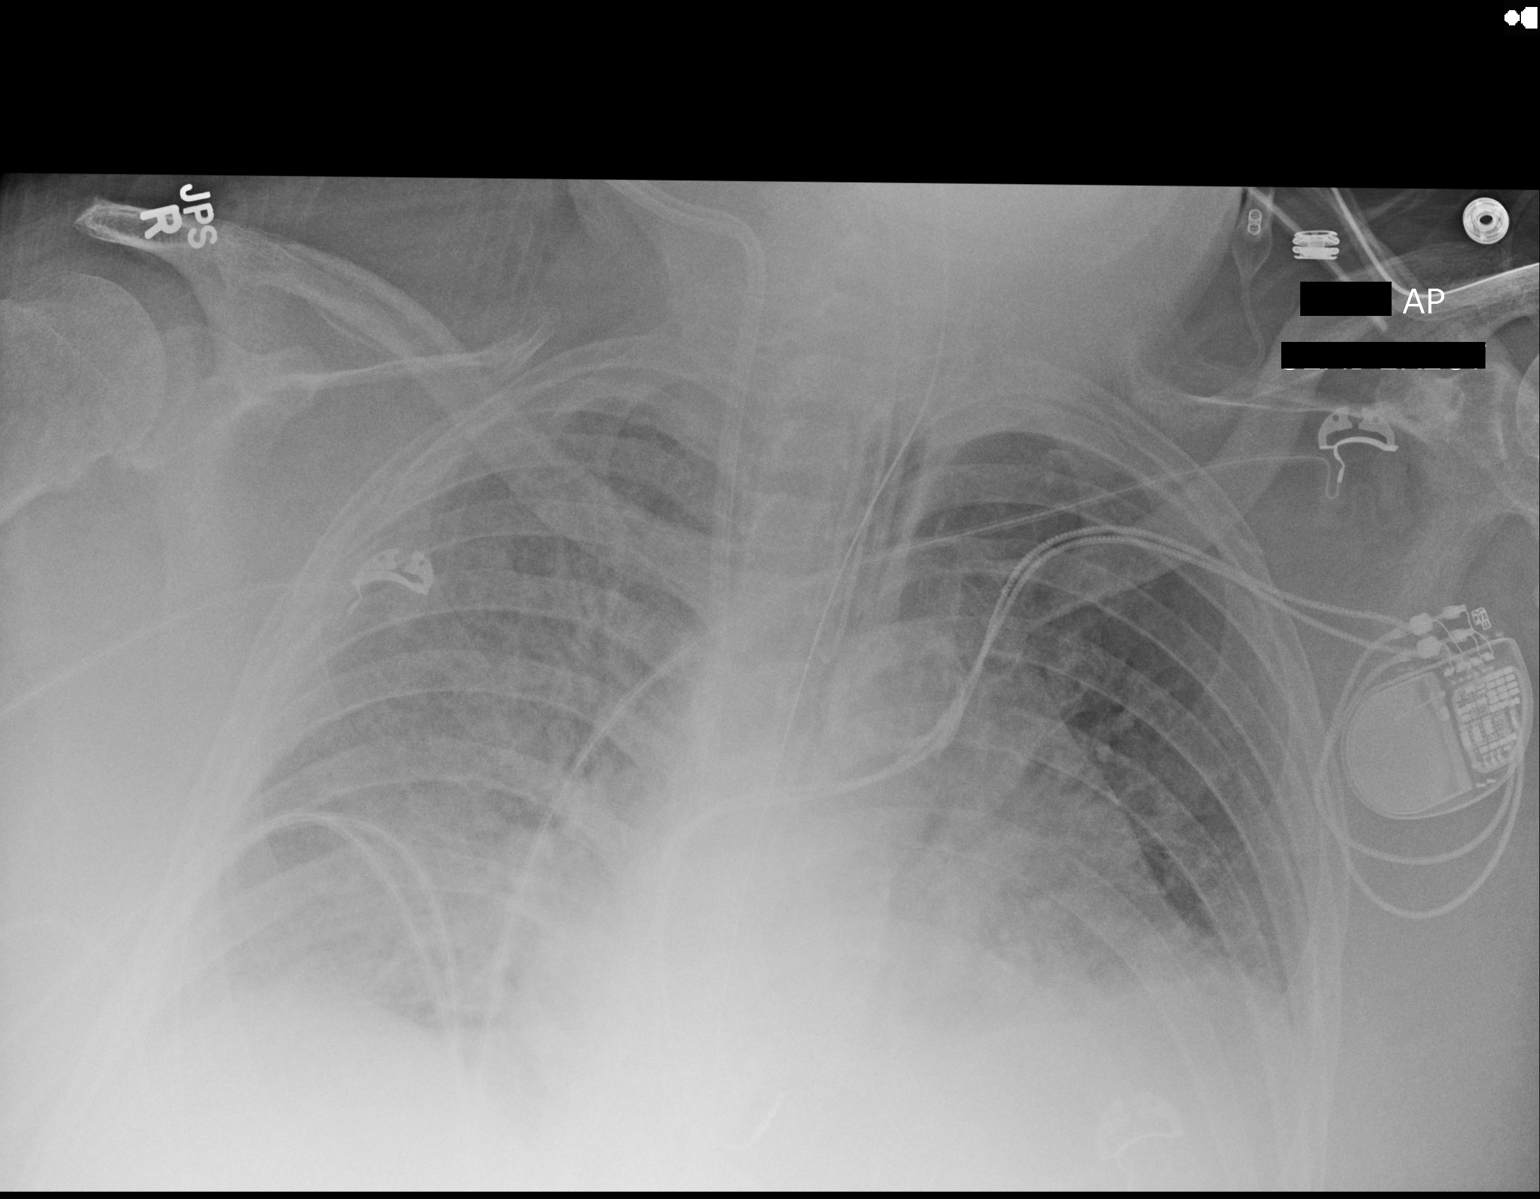

[1 of 1 positions shown; findings below may reference images not displayed]

FINDINGS: The lungs are very poorly aerated with diffuse airspace disease
which is little changed to slightly worsened. Considerations again
of that of edema versus diffuse pneumonia. The tip of the
endotracheal tube is approximately 1.9 cm above the carina. Right IJ
central venous line tip overlies the mid SVC. Pacer wires remain.
IMPRESSION: 1. Very poor aeration of the lungs with little change to slight
worsening of diffuse airspace disease, consistent with either edema
or diffuse pneumonia.
2. Tip of endotracheal 1.9 cm above the carina.
